# Patient Record
Sex: Male | Born: 1980 | Race: White | Hispanic: Yes | Marital: Married | Smoking: Never smoker
Health system: Southern US, Community
[De-identification: ages and names within clinical notes are randomized; demographics above are authoritative.]

## PROBLEM LIST (undated history)

## (undated) DIAGNOSIS — M109 Gout, unspecified: Secondary | ICD-10-CM

## (undated) DIAGNOSIS — B001 Herpesviral vesicular dermatitis: Secondary | ICD-10-CM

## (undated) DIAGNOSIS — F411 Generalized anxiety disorder: Secondary | ICD-10-CM

## (undated) DIAGNOSIS — N529 Male erectile dysfunction, unspecified: Secondary | ICD-10-CM

## (undated) DIAGNOSIS — I10 Essential (primary) hypertension: Secondary | ICD-10-CM

## (undated) DIAGNOSIS — F900 Attention-deficit hyperactivity disorder, predominantly inattentive type: Secondary | ICD-10-CM

## (undated) DIAGNOSIS — L219 Seborrheic dermatitis, unspecified: Secondary | ICD-10-CM

## (undated) DIAGNOSIS — R945 Abnormal results of liver function studies: Secondary | ICD-10-CM

## (undated) DIAGNOSIS — C8330 Diffuse large B-cell lymphoma, unspecified site: Secondary | ICD-10-CM

## (undated) HISTORY — DX: Attention-deficit hyperactivity disorder, predominantly inattentive type: F90.0

## (undated) HISTORY — DX: Gout, unspecified: M10.9

## (undated) HISTORY — DX: Male erectile dysfunction, unspecified: N52.9

## (undated) HISTORY — DX: Herpesviral vesicular dermatitis: B00.1

## (undated) HISTORY — DX: Seborrheic dermatitis, unspecified: L21.9

## (undated) HISTORY — DX: Diffuse large B-cell lymphoma, unspecified site: C83.30

## (undated) HISTORY — DX: Abnormal results of liver function studies: R94.5

## (undated) HISTORY — DX: Generalized anxiety disorder: F41.1

---

## 2005-05-26 ENCOUNTER — Emergency Department (HOSPITAL_COMMUNITY): Admission: EM | Admit: 2005-05-26 | Discharge: 2005-05-26 | Payer: Self-pay | Admitting: Emergency Medicine

## 2005-08-21 ENCOUNTER — Emergency Department (HOSPITAL_COMMUNITY): Admission: EM | Admit: 2005-08-21 | Discharge: 2005-08-21 | Payer: Self-pay | Admitting: *Deleted

## 2006-06-27 ENCOUNTER — Emergency Department (HOSPITAL_COMMUNITY): Admission: EM | Admit: 2006-06-27 | Discharge: 2006-06-27 | Payer: Self-pay | Admitting: Emergency Medicine

## 2006-10-20 ENCOUNTER — Emergency Department (HOSPITAL_COMMUNITY): Admission: EM | Admit: 2006-10-20 | Discharge: 2006-10-20 | Payer: Self-pay | Admitting: Family Medicine

## 2010-04-01 ENCOUNTER — Emergency Department (HOSPITAL_COMMUNITY): Admission: EM | Admit: 2010-04-01 | Discharge: 2010-04-01 | Payer: Self-pay | Admitting: Family Medicine

## 2011-08-22 ENCOUNTER — Other Ambulatory Visit: Payer: Self-pay | Admitting: Family Medicine

## 2011-08-24 ENCOUNTER — Ambulatory Visit
Admission: RE | Admit: 2011-08-24 | Discharge: 2011-08-24 | Disposition: A | Discharge status: Home / Self Care | Payer: BC Managed Care – PPO | Source: Ambulatory Visit | Attending: Family Medicine | Admitting: Family Medicine

## 2011-10-28 ENCOUNTER — Encounter (HOSPITAL_COMMUNITY): Payer: Self-pay

## 2011-10-28 ENCOUNTER — Emergency Department (INDEPENDENT_AMBULATORY_CARE_PROVIDER_SITE_OTHER)
Admission: EM | Admit: 2011-10-28 | Discharge: 2011-10-28 | Disposition: A | Discharge status: Home / Self Care | Payer: BC Managed Care – PPO | Source: Home / Self Care | Attending: Emergency Medicine | Admitting: Emergency Medicine

## 2011-10-28 DIAGNOSIS — S39011A Strain of muscle, fascia and tendon of abdomen, initial encounter: Secondary | ICD-10-CM

## 2011-10-28 DIAGNOSIS — IMO0002 Reserved for concepts with insufficient information to code with codable children: Secondary | ICD-10-CM

## 2011-10-28 HISTORY — DX: Essential (primary) hypertension: I10

## 2011-10-28 MED ORDER — DICLOFENAC SODIUM 75 MG PO TBEC: 75.0000 mg | DELAYED_RELEASE_TABLET | Freq: Two times a day (BID) | ORAL | Status: DC

## 2011-10-28 MED ORDER — ACETAMINOPHEN-CODEINE #3 300-30 MG PO TABS: 1.0000 | ORAL_TABLET | ORAL | Status: AC | PRN

## 2011-10-28 NOTE — ED Notes (Signed)
States was lifting dryer yesterday and felt pulling sensation, started having pain hs.

## 2011-10-28 NOTE — ED Provider Notes (Signed)
Chief Complaint  Patient presents with  . Abdominal Pain    lower left quad abdominal pain    History of Present Illness:  Mark Burns was moving a heavy dryer yesterday at his home when he felt a strain in his left lower abdomen and ever since then he's had a dull ache in the left lower quadrant of the abdomen which is worse if he stands up or sits up and better if he lies down. He cannot feel any bulge or lump in the area. He's felt slightly nauseated and had some bladder pressure but denies any fever, chills, vomiting, constipation, diarrhea, blood in the stool, blood in the urine, dysuria, or testicular swelling.  Review of Systems:  Other than noted above, the patient denies any of the following symptoms: Constitutional:  No fever, chills, fatigue, weight loss or anorexia. Lungs:  No cough or shortness of breath. Heart:  No chest pain, palpitations, syncope or edema. Abdomen:  No nausea, vomiting, hematememesis, melena, diarrhea, or hematochezia. GU:  No dysuria, frequency, urgency, or hematuria. Skin:  No rash or itching.  PMFSH:  Past medical history, family history, social history, meds, and allergies were reviewed.  Physical Exam:   Vital signs:  BP 153/95  Pulse 86  Temp(Src) 98.4 F (36.9 C) (Oral)  Resp 16  SpO2 99% Gen:  Alert, oriented, in no distress. Lungs:  Breath sounds clear and equal bilaterally.  No wheezes, rales or rhonchi. Heart:  Regular rhythm.  No gallops or murmers.   Abdomen:  His abdomen was soft and flat and nondistended. There was minimal pain to palpation in the left lower cord and without guarding or rebound. I could not appreciate any bulge or lump in this area. There was no organomegaly or masses and bowel sounds are normally active. He had no inguinal hernia, testes were normal. Genital exam was unremarkable. Skin:  Clear, warm and dry.  No rash.  Assessment:   Diagnoses that have been ruled out:  None  Diagnoses that are still under consideration:    None  Final diagnoses:  Abdominal muscle strain    Plan:   1.  The following meds were prescribed:   New Prescriptions   ACETAMINOPHEN-CODEINE (TYLENOL #3) 300-30 MG PER TABLET    Take 1-2 tablets by mouth every 4 (four) hours as needed for pain.   DICLOFENAC (VOLTAREN) 75 MG EC TABLET    Take 1 tablet (75 mg total) by mouth 2 (two) times daily.   2.  The patient was instructed in symptomatic care and handouts were given. 3.  The patient was told to return if becoming worse in any way, if no better in 3 or 4 days, and given some red flag symptoms that would indicate earlier return. 4.   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 He was told to avoid heavy lifting for the next 2 weeks. Over the next couple days he is to rest, elevate his legs, apply ice. He was told to return if she should become worse in any way or if you can feel a bulge or lump in the area.   Roque Lias, MD 10/28/11 [phone]

## 2012-01-10 ENCOUNTER — Encounter (HOSPITAL_COMMUNITY): Payer: Self-pay | Admitting: Physical Medicine and Rehabilitation

## 2012-01-10 ENCOUNTER — Emergency Department (HOSPITAL_COMMUNITY)
Admission: EM | Admit: 2012-01-10 | Discharge: 2012-01-10 | Disposition: A | Discharge status: Home / Self Care | Payer: BC Managed Care – PPO | Attending: Emergency Medicine | Admitting: Emergency Medicine

## 2012-01-10 DIAGNOSIS — R109 Unspecified abdominal pain: Secondary | ICD-10-CM | POA: Insufficient documentation

## 2012-01-10 DIAGNOSIS — K5289 Other specified noninfective gastroenteritis and colitis: Secondary | ICD-10-CM | POA: Insufficient documentation

## 2012-01-10 DIAGNOSIS — K529 Noninfective gastroenteritis and colitis, unspecified: Secondary | ICD-10-CM

## 2012-01-10 LAB — URINALYSIS, ROUTINE W REFLEX MICROSCOPIC
Bilirubin Urine: NEGATIVE
Glucose, UA: NEGATIVE mg/dL
Ketones, ur: NEGATIVE mg/dL
Leukocytes, UA: NEGATIVE
Nitrite: NEGATIVE
Specific Gravity, Urine: 1.024 (ref 1.005–1.030)
Urobilinogen, UA: 0.2 mg/dL (ref 0.0–1.0)
pH: 5.5 (ref 5.0–8.0)

## 2012-01-10 LAB — URINE MICROSCOPIC-ADD ON

## 2012-01-10 MED ORDER — ONDANSETRON HCL 8 MG PO TABS: 8.0000 mg | ORAL_TABLET | Freq: Three times a day (TID) | ORAL | Status: AC | PRN

## 2012-01-10 MED ORDER — FAMOTIDINE IN NACL 20-0.9 MG/50ML-% IV SOLN
20.0000 mg | Freq: Once | INTRAVENOUS | Status: AC
  Administered 2012-01-10: 20 mg via INTRAVENOUS
  Filled 2012-01-10: qty 50

## 2012-01-10 MED ORDER — HYDROMORPHONE HCL PF 1 MG/ML IJ SOLN
1.0000 mg | Freq: Once | INTRAMUSCULAR | Status: AC
  Administered 2012-01-10: 1 mg via INTRAVENOUS
  Filled 2012-01-10: qty 1

## 2012-01-10 MED ORDER — SODIUM CHLORIDE 0.9 % IV SOLN
INTRAVENOUS | Status: DC
  Administered 2012-01-10: 125 mL/h via INTRAVENOUS
  Administered 2012-01-10: 19:00:00 via INTRAVENOUS

## 2012-01-10 MED ORDER — HYDROMORPHONE HCL PF 2 MG/ML IJ SOLN
2.0000 mg | Freq: Once | INTRAMUSCULAR | Status: AC
  Administered 2012-01-10: 2 mg via INTRAVENOUS
  Filled 2012-01-10: qty 1

## 2012-01-10 MED ORDER — SODIUM CHLORIDE 0.9 % IV BOLUS (SEPSIS)
2000.0000 mL | Freq: Once | INTRAVENOUS | Status: AC
  Administered 2012-01-10: 1000 mL via INTRAVENOUS

## 2012-01-10 MED ORDER — HYDROCODONE-ACETAMINOPHEN 5-325 MG PO TABS: 2.0000 | ORAL_TABLET | ORAL | Status: AC | PRN

## 2012-01-10 MED ORDER — FAMOTIDINE 20 MG PO TABS: 20.0000 mg | ORAL_TABLET | Freq: Two times a day (BID) | ORAL | Status: DC | PRN

## 2012-01-10 MED ORDER — ONDANSETRON HCL 4 MG/2ML IJ SOLN
4.0000 mg | Freq: Once | INTRAMUSCULAR | Status: AC
  Administered 2012-01-10: 4 mg via INTRAVENOUS
  Filled 2012-01-10: qty 2

## 2012-01-10 NOTE — Discharge Instructions (Signed)
Abdominal Pain (Nonspecific) Your exam might not show the exact reason you have abdominal pain. Since there are many different causes of abdominal pain, another checkup and more tests may be needed. It is very important to follow up for lasting (persistent) or worsening symptoms. A possible cause of abdominal pain in any person who still has his or her appendix is acute appendicitis. Appendicitis is often hard to diagnose. Normal blood tests, urine tests, ultrasound, and CT scans do not completely rule out early appendicitis or other causes of abdominal pain. Sometimes, only the changes that happen over time will allow appendicitis and other causes of abdominal pain to be determined. Other potential problems that may require surgery may also take time to become more apparent. Because of this, it is important that you follow all of the instructions below. HOME CARE INSTRUCTIONS   Rest as much as possible.   Do not eat solid food until your pain is gone.   While adults or children have pain: A diet of water, weak decaffeinated tea, broth or bouillon, gelatin, oral rehydration solutions (ORS), frozen ice pops, or ice chips may be helpful.   When pain is gone in adults or children: Start a light diet (dry toast, crackers, applesauce, or white rice). Increase the diet slowly as long as it does not bother you. Eat no dairy products (including cheese and eggs) and no spicy, fatty, fried, or high-fiber foods.   Use no alcohol, caffeine, or cigarettes.   Take your regular medicines unless your caregiver told you not to.   Take any prescribed medicine as directed.   Only take over-the-counter or prescription medicines for pain, discomfort, or fever as directed by your caregiver. Do not give aspirin to children.  If your caregiver has given you a follow-up appointment, it is very important to keep that appointment. Not keeping the appointment could result in a permanent injury and/or lasting (chronic) pain  and/or disability. If there is any problem keeping the appointment, you must call to reschedule.  SEEK IMMEDIATE MEDICAL CARE IF:   Your pain is not gone in 24 hours.   Your pain becomes worse, changes location, or feels different.   You or your child has an oral temperature above 102 F (38.9 C), not controlled by medicine.   Your baby is older than 3 months with a rectal temperature of 102 F (38.9 C) or higher.   Your baby is 55 months old or younger with a rectal temperature of 100.4 F (38 C) or higher.   You have shaking chills.   You keep throwing up (vomiting) or cannot drink liquids.   There is blood in your vomit or you see blood in your bowel movements.   Your bowel movements become dark or black.   You have frequent bowel movements.   Your bowel movements stop (become blocked) or you cannot pass gas.   You have bloody, frequent, or painful urination.   You have yellow discoloration in the skin or whites of the eyes.   Your stomach becomes bloated or bigger.   You have dizziness or fainting.   You have chest or back pain.  MAKE SURE YOU:   Understand these instructions.   Will watch your condition.   Will get help right away if you are not doing well or get worse.  Document Released: 10/01/2005 Document Revised: 09/20/2011 Document Reviewed: 08/29/2009 [facility] Patient Information 2012 [place], [place].Clear Liquid Diet The clear liquid dietconsists of foods that are liquid or will  become liquid at room temperature.You should be able to see through the liquid and beverages. Examples of foods allowed on a clear liquid diet include fruit juice, broth or bouillon, gelatin, or frozen ice pops. The purpose of this diet is to provide necessary fluid, electrolytes such as sodium and potassium, and energy to keep the body functioning during times when you are not able to consume a regular diet.A clear liquid diet should not be continued for long periods of time as  it is not nutritionally adequate.  REASONS FOR USING A CLEAR LIQUID DIET  In sudden onset (acute) conditions for a patient before or after surgery.   As the first step in oral feeding.   For fluid and electrolyte replacement in diarrheal diseases.   As a diet before certain medical tests are performed.  ADEQUACY The clear liquid diet is adequate only in ascorbic acid, according to the Recommended Dietary Allowances of the [employer]. CHOOSING FOODS Breads and Starches  Allowed:  None are allowed.   Avoid: All are avoided.  Vegetables  Allowed:  Strained tomato or vegetable juice.   Avoid: Any others.  Fruit  Allowed:  Strained fruit juices and fruit drinks. Include 1 serving of citrus or vitamin C-enriched fruit juice daily.   Avoid: Any others.  Meat and Meat Substitutes  Allowed:  None are allowed.   Avoid: All are avoided.  Milk  Allowed:  None are allowed.   Avoid: All are avoided.  Soups and Combination Foods  Allowed:  Clear bouillon, broth, or strained broth-based soups.   Avoid: Any others.  Desserts and Sweets  Allowed:  Sugar, honey. High protein gelatin. Flavored gelatin, ices, or frozen ice pops that do not contain milk.   Avoid: Any others.  Fats and Oils  Allowed:  None are allowed.   Avoid: All are avoided.  Beverages  Allowed: Cereal beverages, coffee (regular or decaffeinated), tea, or soda at the discretion of your caregiver.   Avoid: Any others.  Condiments  Allowed:  Iodized salt.   Avoid: Any others, including pepper.  Supplements  Allowed:  Liquid nutrition beverages.   Avoid: Any others that contain lactose or fiber.  SAMPLE MEAL PLAN Breakfast  4 oz (120 mL) strained orange juice.    to 1 cup (125 to 250 mL) gelatin (plain or fortified).   1 cup (250 mL) beverage (coffee or tea).   Sugar, if desired.  Midmorning Snack   cup (125 mL) gelatin (plain or fortified).  Lunch  1 cup (250 mL) broth or  consomm.   4 oz (120 mL) strained grapefruit juice.    cup (125 mL) gelatin (plain or fortified).   1 cup (250 mL) beverage (coffee or tea).   Sugar, if desired.  Midafternoon Snack   cup (125 mL) fruit ice.    cup (125 mL) strained fruit juice.  Dinner  1 cup (250 mL) broth or consomm.    cup (125 mL) cranberry juice.    cup (125 mL) flavored gelatin (plain or fortified).   1 cup (250 mL) beverage (coffee or tea).   Sugar, if desired.  Evening Snack  4 oz (120 mL) strained apple juice (vitamin C-fortified).    cup (125 mL) flavored gelatin (plain or fortified).  Document Released: 10/01/2005 Document Revised: 09/20/2011 Document Reviewed: 12/29/2010 [facility] Patient Information 2012 [place], [place].Viral Gastroenteritis Viral gastroenteritis is also known as stomach flu. This condition affects the stomach and intestinal tract. It can cause sudden diarrhea and vomiting.  The illness typically lasts 3 to 8 days. Most people develop an immune response that eventually gets rid of the virus. While this natural response develops, the virus can make you quite ill. CAUSES  Many different viruses can cause gastroenteritis, such as rotavirus or noroviruses. You can catch one of these viruses by consuming contaminated food or water. You may also catch a virus by sharing utensils or other personal items with an infected person or by touching a contaminated surface. SYMPTOMS  The most common symptoms are diarrhea and vomiting. These problems can cause a severe loss of body fluids (dehydration) and a body salt (electrolyte) imbalance. Other symptoms may include:  Fever.   Headache.   Fatigue.   Abdominal pain.  DIAGNOSIS  Your caregiver can usually diagnose viral gastroenteritis based on your symptoms and a physical exam. A stool sample may also be taken to test for the presence of viruses or other infections. TREATMENT  This illness typically goes away on its own.  Treatments are aimed at rehydration. The most serious cases of viral gastroenteritis involve vomiting so severely that you are not able to keep fluids down. In these cases, fluids must be given through an intravenous line (IV). HOME CARE INSTRUCTIONS   Drink enough fluids to keep your urine clear or pale yellow. Drink small amounts of fluids frequently and increase the amounts as tolerated.   Ask your caregiver for specific rehydration instructions.   Avoid:   Foods high in sugar.   Alcohol.   Carbonated drinks.   Tobacco.   Juice.   Caffeine drinks.   Extremely hot or cold fluids.   Fatty, greasy foods.   Too much intake of anything at one time.   Dairy products until 24 to 48 hours after diarrhea stops.   You may consume probiotics. Probiotics are active cultures of beneficial bacteria. They may lessen the amount and number of diarrheal stools in adults. Probiotics can be found in yogurt with active cultures and in supplements.   Wash your hands well to avoid spreading the virus.   Only take over-the-counter or prescription medicines for pain, discomfort, or fever as directed by your caregiver. Do not give aspirin to children. Antidiarrheal medicines are not recommended.   Ask your caregiver if you should continue to take your regular prescribed and over-the-counter medicines.   Keep all follow-up appointments as directed by your caregiver.  SEEK IMMEDIATE MEDICAL CARE IF:   You are unable to keep fluids down.   You do not urinate at least once every 6 to 8 hours.   You develop shortness of breath.   You notice blood in your stool or vomit. This may look like coffee grounds.   You have abdominal pain that increases or is concentrated in one small area (localized).   You have persistent vomiting or diarrhea.   You have a fever.   The patient is a child younger than 3 months, and he or she has a fever.   The patient is a child older than 3 months, and he or she  has a fever and persistent symptoms.   The patient is a child older than 3 months, and he or she has a fever and symptoms suddenly get worse.   The patient is a baby, and he or she has no tears when crying.  MAKE SURE YOU:   Understand these instructions.   Will watch your condition.   Will get help right away if you are not doing well or  get worse.  Document Released: 10/01/2005 Document Revised: 09/20/2011 Document Reviewed: 07/18/2011 [facility] Patient Information 2012 [place], [place].

## 2012-01-10 NOTE — ED Provider Notes (Signed)
History     CSN: [id number]  Arrival date & time 01/10/12  1440   First MD Initiated Contact with Patient 01/10/12 1538      Chief Complaint  Patient presents with  . Abdominal Pain  . Nausea  . Emesis  . Diarrhea    (Consider location/radiation/quality/duration/timing/severity/associated sxs/prior treatment) Patient is a 31 y.o. male presenting with abdominal pain. The history is provided by the patient.  Abdominal Pain The primary symptoms of the illness include abdominal pain, fatigue, nausea, vomiting and diarrhea. The primary symptoms of the illness do not include fever, shortness of breath, hematemesis, hematochezia or dysuria. The current episode started yesterday. The onset of the illness was gradual. The problem has not changed since onset. The abdominal pain is generalized. The abdominal pain does not radiate. The severity of the abdominal pain is 8/10. The abdominal pain is relieved by vomiting, bowel movement and being still. The abdominal pain is exacerbated by movement and eating (Palpation).  The fatigue began yesterday. The fatigue has been unchanged since its onset.  The vomiting began yesterday. Vomiting occurs 6 to 10 times per day. The emesis contains stomach contents.  The diarrhea began yesterday. The diarrhea is watery (Without blood or mucus). The diarrhea occurs 5 to 10 times per day.  The patient has had a change in bowel habit. Additional symptoms associated with the illness include chills, anorexia, diaphoresis and heartburn. Symptoms associated with the illness do not include constipation, urgency, hematuria, frequency or back pain.    Past Medical History  Diagnosis Date  . Hypertension     No past surgical history on file.  History reviewed. No pertinent family history.  History  Substance Use Topics  . Smoking status: Never Smoker   . Smokeless tobacco: Not on file  . Alcohol Use: Yes      Review of Systems  Unable to perform  ROS Constitutional: Positive for chills, diaphoresis, appetite change and fatigue. Negative for fever, activity change and unexpected weight change.  HENT: Negative for ear pain, congestion, sore throat, rhinorrhea, mouth sores, trouble swallowing, neck pain, neck stiffness and postnasal drip.   Eyes: Negative.   Respiratory: Negative for cough, chest tightness, shortness of breath and wheezing.   Cardiovascular: Negative for chest pain, palpitations and leg swelling.  Gastrointestinal: Positive for heartburn, nausea, vomiting, abdominal pain, diarrhea and anorexia. Negative for constipation, blood in stool, hematochezia, abdominal distention, anal bleeding, rectal pain and hematemesis.  Genitourinary: Negative for dysuria, urgency, frequency, hematuria and flank pain.  Musculoskeletal: Negative for myalgias, back pain and arthralgias.  Skin: Negative for color change, pallor, rash and wound.  Neurological: Negative for dizziness, syncope, weakness, light-headedness and headaches.  Hematological: Negative for adenopathy.  Psychiatric/Behavioral: Negative.     Allergies  Sulfa antibiotics  Home Medications   Current Outpatient Rx  Name Route Sig Dispense Refill  . CITALOPRAM HYDROBROMIDE 40 MG PO TABS Oral Take 40 mg by mouth daily.    Marland Kitchen LOSARTAN POTASSIUM 100 MG PO TABS Oral Take 100 mg by mouth daily.    Marland Kitchen FAMOTIDINE 20 MG PO TABS Oral Take 1 tablet (20 mg total) by mouth 2 (two) times daily as needed for heartburn (upset stomach). 14 tablet 0  . HYDROCODONE-ACETAMINOPHEN 5-325 MG PO TABS Oral Take 2 tablets by mouth every 4 (four) hours as needed for pain. 20 tablet 0  . ONDANSETRON HCL 8 MG PO TABS Oral Take 1 tablet (8 mg total) by mouth every 8 (eight) hours as needed for  nausea. 12 tablet 0    BP 120/61  Pulse 108  Temp(Src) 102.4 F (39.1 C) (Oral)  Resp 18  SpO2 98%  Physical Exam  Nursing note and vitals reviewed. Constitutional: He is oriented to person, place, and  time. He appears well-developed and well-nourished. He is active.  Non-toxic appearance. He does not have a sickly appearance. He does not appear ill. He appears distressed.  HENT:  Head: Normocephalic and atraumatic.  Right Ear: Hearing, tympanic membrane, external ear and ear canal normal.  Left Ear: Hearing, tympanic membrane, external ear and ear canal normal.  Nose: Nose normal. No mucosal edema.  Mouth/Throat: Uvula is midline and oropharynx is clear and moist. Mucous membranes are dry. No oral lesions. No uvula swelling. No oropharyngeal exudate, posterior oropharyngeal edema, posterior oropharyngeal erythema or tonsillar abscesses.  Eyes: Conjunctivae and EOM are normal. Pupils are equal, round, and reactive to light. Right eye exhibits no chemosis, no discharge and no exudate. Left eye exhibits no chemosis, no discharge and no exudate. Right conjunctiva is not injected. Left conjunctiva is not injected. No scleral icterus.  Neck: Normal range of motion, full passive range of motion without pain and phonation normal. Neck supple. No rigidity. No Brudzinski's sign noted.  Cardiovascular: Normal rate, regular rhythm, intact distal pulses and normal pulses.   No extrasystoles are present.  Pulmonary/Chest: Effort normal and breath sounds normal. No accessory muscle usage. Not tachypneic. No respiratory distress. He has no decreased breath sounds. He has no wheezes. He has no rhonchi. He has no rales. He exhibits no tenderness, no crepitus and no retraction.  Abdominal: Soft. Normal appearance. He exhibits no shifting dullness, no distension, no pulsatile liver, no fluid wave, no abdominal bruit, no ascites, no pulsatile midline mass and no mass. Bowel sounds are increased. There is no hepatosplenomegaly. There is generalized tenderness. There is no rigidity, no rebound, no guarding and no CVA tenderness. No hernia.  Musculoskeletal: Normal range of motion.  Neurological: He is alert and oriented to  person, place, and time. He has normal strength and normal reflexes. He is not disoriented. No cranial nerve deficit. Coordination normal. GCS eye subscore is 4. GCS verbal subscore is 5. GCS motor subscore is 6.  Skin: Skin is warm, dry and intact. No bruising, no ecchymosis, no lesion and no rash noted. He is not diaphoretic. No erythema. No pallor.  Psychiatric: He has a normal mood and affect. His speech is normal and behavior is normal. Judgment and thought content normal. Cognition and memory are normal.    ED Course  Procedures (including critical care time)  Labs Reviewed  URINALYSIS, ROUTINE W REFLEX MICROSCOPIC - Abnormal; Notable for the following:    Hgb urine dipstick MODERATE (*)    Protein, ur 30 (*)    All other components within normal limits  URINE MICROSCOPIC-ADD ON - Abnormal; Notable for the following:    Casts HYALINE CASTS (*)    All other components within normal limits   No results found.   1. Gastroenteritis   2. Abdominal pain       MDM  The patient's symptoms and evaluation are compatible with acute viral gastroenteritis with generalized abdominal cramping likely secondary to inflammation of the stomach and intestines do to viral infection. He appears moderately dehydrated. I will replace the patient's fluid losses with IV fluids and treat his symptoms before reevaluating him and if improved, sending the patient home with prescription medications to treat his symptoms.  On reevaluation, the  patient's nausea has resolved with no further vomiting and his abdominal pain is significantly improved. His been rehydrated with at least 2 L of normal saline and is now tolerating oral intake.         Felisa Bonier, MD 01/10/12 2128

## 2012-01-10 NOTE — ED Notes (Signed)
Pt to ED c/o nausea, vomiting and diarrhea.  St's his children were sick and now he has it.  St's has vomited severals times today and now feels dizzy.

## 2012-01-10 NOTE — ED Notes (Signed)
Pt presents to department for evaluation of abdominal pain, N/V/D, and fatigue. Onset last night while at home. Pt states unable to keep down food/fluids today. Also states he feels dizzy. He is alert and oriented x4. 7/10 abdominal pain at the time.

## 2012-08-09 ENCOUNTER — Other Ambulatory Visit: Payer: Self-pay

## 2012-08-09 ENCOUNTER — Encounter (HOSPITAL_COMMUNITY): Payer: Self-pay | Admitting: Emergency Medicine

## 2012-08-09 ENCOUNTER — Emergency Department (INDEPENDENT_AMBULATORY_CARE_PROVIDER_SITE_OTHER)
Admission: EM | Admit: 2012-08-09 | Discharge: 2012-08-09 | Disposition: A | Discharge status: Home / Self Care | Payer: BC Managed Care – PPO | Source: Home / Self Care | Attending: Emergency Medicine | Admitting: Emergency Medicine

## 2012-08-09 DIAGNOSIS — R0789 Other chest pain: Secondary | ICD-10-CM

## 2012-08-09 MED ORDER — CYCLOBENZAPRINE HCL 10 MG PO TABS: 10.0000 mg | ORAL_TABLET | Freq: Three times a day (TID) | ORAL | Status: DC | PRN

## 2012-08-09 MED ORDER — HYDROCODONE-ACETAMINOPHEN 5-500 MG PO TABS: 1.0000 | ORAL_TABLET | Freq: Four times a day (QID) | ORAL | Status: DC | PRN

## 2012-08-09 NOTE — ED Notes (Addendum)
Pt c/o chest pain since 11:00 this am... Pain gradually getting worse... Sx include: left arm numbness that radiates towards the left hand, painful to inhale... 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 Denies: Headaches, blurry vision, SOB, edema... Hx of Hypertension... Pt is alert w/no signs of distress

## 2012-08-09 NOTE — ED Provider Notes (Signed)
History     CSN: [id number]  Arrival date & time 08/09/12  1811   First MD Initiated Contact with Patient 08/09/12 1813      Chief Complaint  Patient presents with  . Chest Pain    (Consider location/radiation/quality/duration/timing/severity/associated sxs/prior treatment) Patient is a 31 y.o. male presenting with chest pain. The history is provided by the patient.  Chest Pain The chest pain began 2 days ago. Chest pain occurs constantly. The chest pain is unchanged. The pain is associated with breathing and lifting. At its most intense, the pain is at 7/10. The quality of the pain is described as aching. The pain radiates to the left shoulder and upper back. Chest pain is worsened by certain positions and deep breathing. Pertinent negatives for primary symptoms include no fever, no fatigue, no shortness of breath, no cough, no palpitations, no abdominal pain, no nausea, no vomiting, no dizziness and no altered mental status.  Pertinent negatives for associated symptoms include no lower extremity edema and no numbness.     Past Medical History  Diagnosis Date  . Hypertension     History reviewed. No pertinent past surgical history.  No family history on file.  History  Substance Use Topics  . Smoking status: Never Smoker   . Smokeless tobacco: Not on file  . Alcohol Use: Yes      Review of Systems  Constitutional: Negative for fever, activity change and fatigue.  Respiratory: Negative for cough, chest tightness and shortness of breath.   Cardiovascular: Positive for chest pain. Negative for palpitations and leg swelling.  Gastrointestinal: Negative for nausea, vomiting and abdominal pain.  Neurological: Negative for dizziness and numbness.  Psychiatric/Behavioral: Negative for altered mental status.    Allergies  Sulfa antibiotics  Home Medications   Current Outpatient Rx  Name Route Sig Dispense Refill  . CITALOPRAM HYDROBROMIDE 40 MG PO TABS Oral Take 40 mg  by mouth daily.    Marland Kitchen LOSARTAN POTASSIUM 100 MG PO TABS Oral Take 100 mg by mouth daily.    . CYCLOBENZAPRINE HCL 10 MG PO TABS Oral Take 1 tablet (10 mg total) by mouth 3 (three) times daily as needed for muscle spasms. 15 tablet 0  . FAMOTIDINE 20 MG PO TABS Oral Take 1 tablet (20 mg total) by mouth 2 (two) times daily as needed for heartburn (upset stomach). 14 tablet 0  . HYDROCODONE-ACETAMINOPHEN 5-500 MG PO TABS Oral Take 1 tablet by mouth every 6 (six) hours as needed for pain. 15 tablet 0    BP 149/88  Pulse 80  Temp 98.2 F (36.8 C) (Oral)  Resp 18  SpO2 97%  Physical Exam  Nursing note and vitals reviewed. Constitutional: He appears well-developed and well-nourished.  Cardiovascular: Normal rate.  Exam reveals no gallop and no friction rub.   No murmur heard. Pulmonary/Chest: Effort normal and breath sounds normal. No respiratory distress. He has no wheezes. He has no rales. He exhibits tenderness. He exhibits no bony tenderness, no laceration, no crepitus, no edema, no deformity, no swelling and no retraction.    Musculoskeletal: He exhibits tenderness.  Neurological: He is alert.  Skin: No rash noted. No erythema.    ED Course  Procedures (including critical care time)  Labs Reviewed - No data to display No results found.   1. Muscular chest pain    Ekg, normal sinus rhythm ventricular rate of 70 beats per minute. Normal PR QRS interval and duration. No ST-T changes.   MDM  Exam  and recent history consistent with muscle skeletal left-sided anterior chest wall pain. Patient was prescribed a course of report along with Flexeril.        Jimmie Molly, MD 08/09/12 2013

## 2012-08-10 ENCOUNTER — Encounter (HOSPITAL_COMMUNITY): Payer: Self-pay

## 2012-10-15 HISTORY — PX: VASECTOMY: SHX75

## 2013-01-26 ENCOUNTER — Telehealth: Payer: Self-pay | Admitting: Physician Assistant

## 2013-01-26 MED ORDER — TADALAFIL 5 MG PO TABS: 5.0000 mg | ORAL_TABLET | Freq: Every day | ORAL | Status: DC | PRN

## 2013-01-26 NOTE — Telephone Encounter (Signed)
Medication refilled per protocol. 

## 2013-04-06 ENCOUNTER — Telehealth: Payer: Self-pay | Admitting: Family Medicine

## 2013-04-06 MED ORDER — LOSARTAN POTASSIUM-HCTZ 100-25 MG PO TABS: 1.0000 | ORAL_TABLET | Freq: Every day | ORAL | Status: DC

## 2013-04-06 NOTE — Telephone Encounter (Signed)
Pt given 30 day supply on BP meds,  please see when he needs appt and schedule.  Please look for pharmacy request and refill as appropriate

## 2013-04-07 ENCOUNTER — Encounter: Payer: Self-pay | Admitting: Family Medicine

## 2013-04-07 NOTE — Telephone Encounter (Signed)
Letter sent to pt to schedule f/u appt.

## 2013-05-13 ENCOUNTER — Other Ambulatory Visit: Payer: Self-pay | Admitting: Physician Assistant

## 2013-05-13 ENCOUNTER — Telehealth: Payer: Self-pay | Admitting: Physician Assistant

## 2013-05-13 ENCOUNTER — Other Ambulatory Visit: Payer: Self-pay | Admitting: *Deleted

## 2013-05-13 MED ORDER — LOSARTAN POTASSIUM-HCTZ 100-25 MG PO TABS: 1.0000 | ORAL_TABLET | Freq: Every day | ORAL | Status: DC

## 2013-05-13 NOTE — Telephone Encounter (Signed)
Patient needs to be seen before any further refills  Has appt scheduled for tomorrow

## 2013-05-13 NOTE — Telephone Encounter (Signed)
Medication refilled per protocol. 

## 2013-05-14 ENCOUNTER — Encounter: Payer: Self-pay | Admitting: Physician Assistant

## 2013-05-14 ENCOUNTER — Ambulatory Visit (INDEPENDENT_AMBULATORY_CARE_PROVIDER_SITE_OTHER): Payer: BC Managed Care – PPO | Admitting: Physician Assistant

## 2013-05-14 VITALS — BP 130/70 | HR 80 | Temp 98.4°F | Resp 20 | Ht 72.75 in | Wt 296.0 lb

## 2013-05-14 DIAGNOSIS — I10 Essential (primary) hypertension: Secondary | ICD-10-CM

## 2013-05-14 DIAGNOSIS — F419 Anxiety disorder, unspecified: Secondary | ICD-10-CM

## 2013-05-14 DIAGNOSIS — Z309 Encounter for contraceptive management, unspecified: Secondary | ICD-10-CM

## 2013-05-14 DIAGNOSIS — F411 Generalized anxiety disorder: Secondary | ICD-10-CM

## 2013-05-14 MED ORDER — LOSARTAN POTASSIUM-HCTZ 100-25 MG PO TABS: 1.0000 | ORAL_TABLET | Freq: Every day | ORAL | Status: DC

## 2013-05-14 MED ORDER — CITALOPRAM HYDROBROMIDE 40 MG PO TABS: 40.0000 mg | ORAL_TABLET | Freq: Every day | ORAL | Status: DC

## 2013-05-14 MED ORDER — LORAZEPAM 0.5 MG PO TABS: 0.5000 mg | ORAL_TABLET | Freq: Every evening | ORAL | Status: DC | PRN

## 2013-05-15 ENCOUNTER — Encounter: Payer: Self-pay | Admitting: Physician Assistant

## 2013-05-15 NOTE — Progress Notes (Signed)
Patient ID: Mark Burns MRN: [id number], DOB: 12/02/1980, 32 y.o. Date of Encounter: @DATE @  Chief Complaint:  Chief Complaint  Patient presents with  . 6 mth check up    c/o alot of stress    HPI: 32 y.o. year old male  Presents for f/u of anxiety d/o. His LOV was 10/2012.  He now works as an Tax inspector. Says the work isnot that difficult but requires a lot more hours and responsibilities. He is over multiple departments of teachers/staff. As well, if there is any sporting event or other event after hours, he has to be there to staff that. Summer: 7-5 Mon-Thurs. During school, there euntil 6-7pm or if an event there until 8-9 pm.  Has 2 kids: 5 and 58 y/o.Married.   At LOV Dr. Tanya Burns added Buspar. Pt says he toook it for one month but saw no difference so he did not continue it. IS taking th eCelexa 20mg  QD.  He has "bouts of panic"-twice a week on average-"Feels like walls are closing in around me like I am getting really small and everything around me is getting bigger. Itfeels like I am going crazy."   Says he "never calms down-even at home, always uptight, anxious." Does not sleep good. Tosses and turns all night. Ends up not falling asleep until 1-2a.m. Then time to wake up and is exhausted.Thenfeels tired and irritated.   Feels like if he could calm down/relax at night, this would really help him.   He stopped his Adderall months ago-was afraid it was making all of this worse. He says it did make it worse. "Was even more jacked up."  He IS taking BP med as directed. No adv effect.  Past Medical History  Diagnosis Date  . Anxiety   . Hypertension      Home Meds: See attached medication section for current medication list. Any medications entered into computer today will not appear on this note's list. The medications listed below were entered prior to today. Current Outpatient Prescriptions on File Prior to Visit  Medication Sig Dispense Refill  . cyclobenzaprine  (FLEXERIL) 10 MG tablet Take 1 tablet (10 mg total) by mouth 3 (three) times daily as needed for muscle spasms.  15 tablet  0  . famotidine (PEPCID) 20 MG tablet Take 1 tablet (20 mg total) by mouth 2 (two) times daily as needed for heartburn (upset stomach).  14 tablet  0  . HYDROcodone-acetaminophen (VICODIN) 5-500 MG per tablet Take 1 tablet by mouth every 6 (six) hours as needed for pain.  15 tablet  0  . tadalafil (CIALIS) 5 MG tablet Take 1 tablet (5 mg total) by mouth daily as needed for erectile dysfunction.  30 tablet  2   No current facility-administered medications on file prior to visit.    Allergies:  Allergies  Allergen Reactions  . Sulfa Antibiotics Hives    History   Social History  . Marital Status: Married    Spouse Name: N/A    Number of Children: N/A  . Years of Education: N/A   Occupational History  . Not on file.   Social History Main Topics  . Smoking status: Never Smoker   . Smokeless tobacco: Not on file  . Alcohol Use: Yes  . Drug Use: No  . Sexually Active: Not on file   Other Topics Concern  . Not on file   Social History Narrative  . No narrative on file    History reviewed. No  pertinent family history.   Review of Systems:  See HPI for pertinent ROS. All other ROS negative.    Physical Exam: Blood pressure 130/70, pulse 80, temperature 98.4 F (36.9 C), temperature source Oral, resp. rate 20, height 6' 0.75" (1.848 m), weight 296 lb (134.265 kg)., Body mass index is 39.32 kg/(m^2). General: Male. Appears in no acute distress. Lungs: Clear bilaterally to auscultation without wheezes, rales, or rhonchi. Breathing is unlabored. Heart: RRR with S1 S2. No murmurs, rubs, or gallops. Musculoskeletal:  Strength and tone normal for age. Extremities/Skin: Warm and dry. No edema.  Neuro: Alert and oriented X 3. Moves all extremities spontaneously. Gait is normal. CNII-XII grossly in tact. Psych:  Responds to questions appropriately with a normal  affect. He is very calm and appropriate throughout visit today.     ASSESSMENT AND PLAN:  32 y.o. year old male with  1. Anxiety Cont Celexa. Add Ativan QHS. Maybe if he can relax and can sleep at night, hopefully this will help. - LORazepam (ATIVAN) 0.5 MG tablet; Take 1 tablet (0.5 mg total) by mouth at bedtime as needed for anxiety.  Dispense: 30 tablet; Refill: 1 - citalopram (CELEXA) 40 MG tablet; Take 1 tablet (40 mg total) by mouth daily.  Dispense: 30 tablet; Refill: 5  2. Hypertension At goal. BMET nml 2013 on this med.  - losartan-hydrochlorothiazide (HYZAAR) 100-25 MG per tablet; Take 1 tablet by mouth daily.  Dispense: 30 tablet; Refill: 5  3. Contraception management He and his wife both agree they do not want more children. He wants to have vasectomy. - Ambulatory referral to Urology  F/U one month to see if this med changes brings improvement.   Mark Burns Mark Burns, [place], [facility] 05/15/2013 6:45 AM

## 2013-06-26 ENCOUNTER — Telehealth: Payer: Self-pay | Admitting: Physician Assistant

## 2013-06-26 MED ORDER — TADALAFIL 5 MG PO TABS: 5.0000 mg | ORAL_TABLET | Freq: Every day | ORAL | Status: DC | PRN

## 2013-06-26 NOTE — Telephone Encounter (Signed)
Medication refilled per protocol. 

## 2013-06-26 NOTE — Telephone Encounter (Signed)
Cialis 5 mg 1 QD prn

## 2013-07-02 ENCOUNTER — Ambulatory Visit: Payer: BC Managed Care – PPO | Admitting: Family Medicine

## 2013-07-02 DIAGNOSIS — Z0289 Encounter for other administrative examinations: Secondary | ICD-10-CM

## 2013-07-23 ENCOUNTER — Telehealth: Payer: Self-pay | Admitting: Physician Assistant

## 2013-07-23 DIAGNOSIS — N529 Male erectile dysfunction, unspecified: Secondary | ICD-10-CM

## 2013-07-23 MED ORDER — TADALAFIL 20 MG PO TABS: 20.0000 mg | ORAL_TABLET | Freq: Every day | ORAL | Status: DC | PRN

## 2013-07-23 NOTE — Telephone Encounter (Signed)
Can change from the 5 mg daily use to the 20 mg PRN use. Send prescription for Cialis 20 mg One at least 30-45 minutes prior to sexual activity. #6 with 11 refills.

## 2013-07-23 NOTE — Telephone Encounter (Signed)
New rx to pharmacy.

## 2013-07-23 NOTE — Telephone Encounter (Signed)
Patient wants to see if his Ciallis can be changed to 20 mg instead of 5 mg?

## 2013-08-06 ENCOUNTER — Ambulatory Visit (INDEPENDENT_AMBULATORY_CARE_PROVIDER_SITE_OTHER): Payer: BC Managed Care – PPO | Admitting: Physician Assistant

## 2013-08-06 ENCOUNTER — Encounter: Payer: Self-pay | Admitting: Physician Assistant

## 2013-08-06 VITALS — BP 122/94 | HR 96 | Temp 98.4°F | Resp 20 | Ht 72.5 in | Wt 284.0 lb

## 2013-08-06 DIAGNOSIS — R7989 Other specified abnormal findings of blood chemistry: Secondary | ICD-10-CM

## 2013-08-06 DIAGNOSIS — F419 Anxiety disorder, unspecified: Secondary | ICD-10-CM

## 2013-08-06 DIAGNOSIS — N529 Male erectile dysfunction, unspecified: Secondary | ICD-10-CM

## 2013-08-06 DIAGNOSIS — F411 Generalized anxiety disorder: Secondary | ICD-10-CM

## 2013-08-06 DIAGNOSIS — I10 Essential (primary) hypertension: Secondary | ICD-10-CM

## 2013-08-06 MED ORDER — DIAZEPAM 10 MG PO TABS: 10.0000 mg | ORAL_TABLET | Freq: Every evening | ORAL | Status: DC | PRN

## 2013-08-06 MED ORDER — TADALAFIL 20 MG PO TABS: 20.0000 mg | ORAL_TABLET | Freq: Every day | ORAL | Status: DC | PRN

## 2013-08-06 NOTE — Progress Notes (Signed)
Patient ID: Mark Burns MRN: [id number], DOB: May 27, 1981, 32 y.o. Date of Encounter: 08/06/2013, 2:56 PM    Chief Complaint:  Chief Complaint  Patient presents with  . check up/labs    discuss anxiety meds     HPI: 32 y.o. year old male here to discuss medications.  See my note dated 05/15/13 for details and a much lengthier note.  At that visit he discussed that he never calms down even at home always uptight and anxious. Reported that he was not sleeping good. Tosses and turns all night. And 7 not falling asleep until 1 or 2 in the morning. And time to wake up and is exhausted. Then he feels tired and irritated. He felt that if he could down and relax at night that this would really help. At that visit I added Ativan to use at night in addition to his Celexa 40 mg.  He says that he recently used one of his wife's diazepam 10 mg at night and that this worked much better for him. Is requesting that we change his medicine to the diazepam 10 mg. Says he only takes it at night to calm down and does not take it during the day.  He is also needing a refill on Cialis. Says this is very Expensive and only wants one or 2 pills at the time.  As well he wanted to recheck his liver numbers. In the past he had been diagnosed with fatty liver. Says he has lost weight with diet changes and decreasing alcohol intake.Wants to  See if the liver numbers are any better.     Home Meds: See attached medication section for any medications that were entered at today's visit. The computer does not put those onto this list.The following list is a list of meds entered prior to today's visit.   Current Outpatient Prescriptions on File Prior to Visit  Medication Sig Dispense Refill  . citalopram (CELEXA) 40 MG tablet Take 1 tablet (40 mg total) by mouth daily.  30 tablet  5  . HYDROcodone-acetaminophen (VICODIN) 5-500 MG per tablet Take 1 tablet by mouth every 6 (six) hours as needed for pain.  15 tablet  0   . losartan-hydrochlorothiazide (HYZAAR) 100-25 MG per tablet Take 1 tablet by mouth daily.  30 tablet  5  . cyclobenzaprine (FLEXERIL) 10 MG tablet Take 1 tablet (10 mg total) by mouth 3 (three) times daily as needed for muscle spasms.  15 tablet  0  . famotidine (PEPCID) 20 MG tablet Take 1 tablet (20 mg total) by mouth 2 (two) times daily as needed for heartburn (upset stomach).  14 tablet  0   No current facility-administered medications on file prior to visit.    Allergies:  Allergies  Allergen Reactions  . Sulfa Antibiotics Hives      Review of Systems: See HPI for pertinent ROS. All other ROS negative.    Physical Exam: Blood pressure 122/94, pulse 96, temperature 98.4 F (36.9 C), temperature source Oral, resp. rate 20, height 6' 0.5" (1.842 m), weight 284 lb (128.822 kg)., Body mass index is 37.97 kg/(m^2). General:  Appears in no acute distress. Lungs: Clear bilaterally to auscultation without wheezes, rales, or rhonchi. Breathing is unlabored. Heart: Regular rhythm. No murmurs, rubs, or gallops. Abdomen: Soft, non-tender, non-distended with normoactive bowel sounds. No hepatomegaly. No rebound/guarding. No obvious abdominal masses. Msk:  Strength and tone normal for age. Extremities/Skin: Warm and dry. No clubbing or cyanosis. No edema. No rashes or  suspicious lesions. Neuro: Alert and oriented X 3. Moves all extremities spontaneously. Gait is normal. CNII-XII grossly in tact. Psych:  Responds to questions appropriately with a normal affect.     ASSESSMENT AND PLAN:  32 y.o. year old male with  1. Anxiety Continue Celexa 40 mg. Does not use more than one Valium at a time. Discussed this is maximum dose. Discussed not to use this every single night as his body will become dependent intolerant to this. - diazepam (VALIUM) 10 MG tablet; Take 1 tablet (10 mg total) by mouth at bedtime as needed for anxiety or sleep.  Dispense: 30 tablet; Refill: 1  2. Hypertension He is  on Hyzaar. While we are doing labs for liver functions we'll also check BMET. - COMPLETE METABOLIC PANEL WITH GFR  3. Erectile dysfunction - tadalafil (CIALIS) 20 MG tablet; Take 1 tablet (20 mg total) by mouth daily as needed for erectile dysfunction.  Dispense: 2 tablet; Refill: 11  4. Elevated LFTs - COMPLETE METABOLIC PANEL WITH GFR   Signed, [address] [place], [place], [facility] 08/06/2013 2:56 PM

## 2013-08-07 LAB — COMPLETE METABOLIC PANEL WITH GFR
ALT: 120 U/L — ABNORMAL HIGH (ref 0–53)
AST: 74 U/L — ABNORMAL HIGH (ref 0–37)
Albumin: 5 g/dL (ref 3.5–5.2)
Alkaline Phosphatase: 59 U/L (ref 39–117)
BUN: 22 mg/dL (ref 6–23)
CO2: 25 mEq/L (ref 19–32)
Calcium: 10.3 mg/dL (ref 8.4–10.5)
Chloride: 101 mEq/L (ref 96–112)
Creat: 0.94 mg/dL (ref 0.50–1.35)
GFR, Est African American: >89 mL/min
GFR, Est Non African American: >89 mL/min
Glucose, Bld: 86 mg/dL (ref 70–99)
Potassium: 3.9 mEq/L (ref 3.5–5.3)
Sodium: 137 mEq/L (ref 135–145)
Total Bilirubin: 0.5 mg/dL (ref 0.3–1.2)
Total Protein: 7.9 g/dL (ref 6.0–8.3)

## 2013-10-23 ENCOUNTER — Telehealth: Payer: Self-pay | Admitting: Physician Assistant

## 2013-10-23 LAB — CBC WITH DIFFERENTIAL/PLATELET
BASOS PCT: 1 %
Basophil #: 0.1 10*3/uL (ref 0.0–0.1)
EOS PCT: 1.4 %
Eosinophil #: 0.2 10*3/uL (ref 0.0–0.7)
HCT: 38.1 % — ABNORMAL LOW (ref 40.0–52.0)
HGB: 13.3 g/dL (ref 13.0–18.0)
Lymphocyte #: 2.7 10*3/uL (ref 1.0–3.6)
Lymphocyte %: 24.3 %
MCH: 30.4 pg (ref 26.0–34.0)
MCHC: 34.9 g/dL (ref 32.0–36.0)
MCV: 87 fL (ref 80–100)
Monocyte #: 1.1 x10 3/mm — ABNORMAL HIGH (ref 0.2–1.0)
Monocyte %: 10.1 %
NEUTROS ABS: 7.1 10*3/uL — AB (ref 1.4–6.5)
Neutrophil %: 63.2 %
PLATELETS: 311 10*3/uL (ref 150–440)
RBC: 4.37 10*6/uL — AB (ref 4.40–5.90)
RDW: 12.6 % (ref 11.5–14.5)
WBC: 11.3 10*3/uL — AB (ref 3.8–10.6)

## 2013-10-23 LAB — COMPREHENSIVE METABOLIC PANEL
ALK PHOS: 76 U/L
Albumin: 4.2 g/dL (ref 3.4–5.0)
Anion Gap: 5 — ABNORMAL LOW (ref 7–16)
BUN: 16 mg/dL (ref 7–18)
Bilirubin,Total: 0.5 mg/dL (ref 0.2–1.0)
CO2: 31 mmol/L (ref 21–32)
Calcium, Total: 9.5 mg/dL (ref 8.5–10.1)
Chloride: 100 mmol/L (ref 98–107)
Creatinine: 1.11 mg/dL (ref 0.60–1.30)
EGFR (African American): >60
EGFR (Non-African Amer.): >60
GLUCOSE: 99 mg/dL (ref 65–99)
Osmolality: 273 (ref 275–301)
Potassium: 3.9 mmol/L (ref 3.5–5.1)
SGOT(AST): 56 U/L — ABNORMAL HIGH (ref 15–37)
SGPT (ALT): 93 U/L — ABNORMAL HIGH (ref 12–78)
Sodium: 136 mmol/L (ref 136–145)
Total Protein: 8.3 g/dL — ABNORMAL HIGH (ref 6.4–8.2)

## 2013-10-23 NOTE — Telephone Encounter (Signed)
Pt is calling went to a walk in clinic yesterday Dr told him that he cellulitis and its not getting any better and he is wanting to speak to someone about this  Call back number is [phone]

## 2013-10-23 NOTE — Telephone Encounter (Signed)
Seen at [facility]. (no appt here)  Mark Burns he had bursitis and cellulitis right arm.  Started around elbow from wound sustained over the holidays.  Tx with Doxycycline, has taken three doses  Area of redness has increased over 1 inch passed marked area from yesterday.  Arm much more painful.  Told patient needs to be seen in ED.  May need IV antibiotics.  Pt acknowledged understanding.

## 2013-10-23 NOTE — Telephone Encounter (Signed)
Agree 

## 2013-10-24 ENCOUNTER — Inpatient Hospital Stay: Payer: Self-pay | Admitting: Internal Medicine

## 2013-10-25 LAB — CBC WITH DIFFERENTIAL/PLATELET
Basophil #: 0.1 10*3/uL (ref 0.0–0.1)
Basophil %: 1.1 %
Eosinophil #: 0.2 10*3/uL (ref 0.0–0.7)
Eosinophil %: 2.1 %
HCT: 33.9 % — ABNORMAL LOW (ref 40.0–52.0)
HGB: 12 g/dL — AB (ref 13.0–18.0)
Lymphocyte #: 2.4 10*3/uL (ref 1.0–3.6)
Lymphocyte %: 28.3 %
MCH: 31 pg (ref 26.0–34.0)
MCHC: 35.5 g/dL (ref 32.0–36.0)
MCV: 88 fL (ref 80–100)
MONO ABS: 0.9 x10 3/mm (ref 0.2–1.0)
Monocyte %: 10.3 %
NEUTROS PCT: 58.2 %
Neutrophil #: 4.9 10*3/uL (ref 1.4–6.5)
Platelet: 254 10*3/uL (ref 150–440)
RBC: 3.87 10*6/uL — AB (ref 4.40–5.90)
RDW: 12.5 % (ref 11.5–14.5)
WBC: 8.4 10*3/uL (ref 3.8–10.6)

## 2013-10-25 LAB — BASIC METABOLIC PANEL
Anion Gap: 3 — ABNORMAL LOW (ref 7–16)
BUN: 21 mg/dL — ABNORMAL HIGH (ref 7–18)
CALCIUM: 9 mg/dL (ref 8.5–10.1)
Chloride: 102 mmol/L (ref 98–107)
Co2: 31 mmol/L (ref 21–32)
Creatinine: 1.11 mg/dL (ref 0.60–1.30)
EGFR (African American): >60
EGFR (Non-African Amer.): >60
Glucose: 111 mg/dL — ABNORMAL HIGH (ref 65–99)
Osmolality: 276 (ref 275–301)
Potassium: 3.5 mmol/L (ref 3.5–5.1)
SODIUM: 136 mmol/L (ref 136–145)

## 2013-10-25 LAB — VANCOMYCIN, TROUGH: VANCOMYCIN, TROUGH: 10 ug/mL (ref 10–20)

## 2013-10-28 LAB — CULTURE, BLOOD (SINGLE)

## 2013-11-02 ENCOUNTER — Telehealth: Payer: Self-pay | Admitting: Family Medicine

## 2013-11-02 MED ORDER — COLCHICINE 0.6 MG PO TABS: ORAL_TABLET | ORAL | Status: DC

## 2013-11-02 NOTE — Telephone Encounter (Signed)
Pt called having gout flare in foot, tried otc nsaids Sent in colchicine therapy Increase water Avoid red meats , uric acid producing foods

## 2013-11-11 ENCOUNTER — Ambulatory Visit: Payer: Self-pay | Admitting: Physician Assistant

## 2013-11-16 ENCOUNTER — Telehealth: Payer: Self-pay | Admitting: Physician Assistant

## 2013-11-16 ENCOUNTER — Other Ambulatory Visit: Payer: Self-pay | Admitting: Physician Assistant

## 2013-11-16 NOTE — Telephone Encounter (Signed)
Approved for #30+ one additional refill 

## 2013-11-16 NOTE — Telephone Encounter (Signed)
PT is needing a refill on his diazepam (VALIUM) 10 MG tablet Call back number is [phone]

## 2013-11-16 NOTE — Telephone Encounter (Signed)
Last RF 10/13 #30 + 1.  Last OV 10/23  OK refill?

## 2013-11-17 NOTE — Telephone Encounter (Signed)
RX called in .

## 2013-11-18 ENCOUNTER — Encounter: Payer: Self-pay | Admitting: Physician Assistant

## 2013-11-18 ENCOUNTER — Ambulatory Visit (INDEPENDENT_AMBULATORY_CARE_PROVIDER_SITE_OTHER): Payer: BC Managed Care – PPO | Admitting: Physician Assistant

## 2013-11-18 VITALS — BP 142/94 | HR 84 | Temp 98.1°F | Resp 18 | Ht 73.5 in | Wt 285.0 lb

## 2013-11-18 DIAGNOSIS — I10 Essential (primary) hypertension: Secondary | ICD-10-CM

## 2013-11-18 DIAGNOSIS — N529 Male erectile dysfunction, unspecified: Secondary | ICD-10-CM

## 2013-11-18 DIAGNOSIS — F419 Anxiety disorder, unspecified: Secondary | ICD-10-CM

## 2013-11-18 DIAGNOSIS — R945 Abnormal results of liver function studies: Secondary | ICD-10-CM

## 2013-11-18 DIAGNOSIS — R7989 Other specified abnormal findings of blood chemistry: Secondary | ICD-10-CM

## 2013-11-18 DIAGNOSIS — F411 Generalized anxiety disorder: Secondary | ICD-10-CM

## 2013-11-18 MED ORDER — VARDENAFIL HCL 20 MG PO TABS: 20.0000 mg | ORAL_TABLET | Freq: Every day | ORAL | Status: DC | PRN

## 2013-11-19 ENCOUNTER — Encounter: Payer: Self-pay | Admitting: Physician Assistant

## 2013-11-19 DIAGNOSIS — R945 Abnormal results of liver function studies: Secondary | ICD-10-CM

## 2013-11-19 DIAGNOSIS — R7989 Other specified abnormal findings of blood chemistry: Secondary | ICD-10-CM | POA: Insufficient documentation

## 2013-11-19 HISTORY — DX: Other specified abnormal findings of blood chemistry: R79.89

## 2013-11-19 NOTE — Progress Notes (Signed)
Patient ID: Rowe Warman MRN: [id number], DOB: [birth year], 33 y.o. Date of Encounter: @DATE @  Chief Complaint:  Chief Complaint  Patient presents with  . 3 mth check up    reports hosp with septic rt elbow in early january at [place]  . wants to discuss restarting adderall    HPI: 33 y.o. year old male  presents for routine f/u OV.  Anxiety/Insomnia:  This has been treated with Celexa 40 mg. However, still, at [place] 8/14 he reported that he felt that he never called down even at home always felt uptight and anxious. Reported that he was not sleeping good. Was tossing turns all night. Often was not falling asleep until 1 or 2 in the morning. It would be time to wake up and would feel exhausted. Then he would feel exhausted and irritated. He felt that if he could just relax at night and gets some sleep that this would really help. At his visit in August 2014 I. have added Ativan in addition to the Celexa. However at his last visit on 10/14 he reported that he recently used one of his wife's diazepam 10 mg at night and that that worked much better for him. Requested that we change his medication to diazepam 10 mg. Did prescribe that at that visit. He has continued to use this and says that this is working well. He is now able to relax at night and is able to sleep and feeling rested.  In the past he has also been prescribed medication to use as needed for erectile dysfunction. Today he is asking if we have any samples were saved and scarred as the medicine is so expensive.  In the past he had been on medication for ADD. Been stopped because of his into anxiety and insomnia in the past. Today he is also asking wondering whether he should restart this.  Hypertension: He is taking his medication as directed and has no adverse effects.   Past Medical History  Diagnosis Date  . Anxiety   . Hypertension   . Erectile dysfunction      Home Meds: See attached medication section for current  medication list. Any medications entered into computer today will not appear on this note's list. The medications listed below were entered prior to today. Current Outpatient Prescriptions on File Prior to Visit  Medication Sig Dispense Refill  . citalopram (CELEXA) 40 MG tablet Take 1 tablet (40 mg total) by mouth daily.  30 tablet  5  . colchicine 0.6 MG tablet Take 1.2mg  x 1, repeat 1 tab in 1 hour, then 1 daily until complete  9 tablet  0  . diazepam (VALIUM) 10 MG tablet TAKE 1 TABLET BY MOUTH AT BEDTIME FOR ANXIETY OR SLEEP  30 tablet  1  . HYDROcodone-acetaminophen (VICODIN) 5-500 MG per tablet Take 1 tablet by mouth every 6 (six) hours as needed for pain.  15 tablet  0  . losartan-hydrochlorothiazide (HYZAAR) 100-25 MG per tablet Take 1 tablet by mouth daily.  30 tablet  5  . tadalafil (CIALIS) 20 MG tablet Take 1 tablet (20 mg total) by mouth daily as needed for erectile dysfunction.  2 tablet  11   No current facility-administered medications on file prior to visit.    Allergies:  Allergies  Allergen Reactions  . Sulfa Antibiotics Hives    History   Social History  . Marital Status: Married    Spouse Name: N/A    Number of Children:  2  . Years of Education: N/A   Occupational History  . 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 Smoking status: Never Smoker   . Smokeless tobacco: Not on file  . Alcohol Use: Yes  . Drug Use: No  . Sexual Activity: Not on file   Other Topics Concern  . 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   2 children-Age 82, 61 y/o    No family history on file.   Review of Systems:  See HPI for pertinent ROS. All other ROS negative.    Physical Exam: Blood pressure 142/94, pulse 84, temperature 98.1 F (36.7 C), temperature source Oral, resp. rate 18, height 6' 1.5" (1.867 m), weight 285 lb (129.275 kg)., Body mass index is 37.09 kg/(m^2). General:Male. Appears in no acute  distress. Neck: Supple. No thyromegaly. No lymphadenopathy. Lungs: Clear bilaterally to auscultation without wheezes, rales, or rhonchi. Breathing is unlabored. Heart: RRR with S1 S2. No murmurs, rubs, or gallops. Abdomen: Soft, non-tender, non-distended with normoactive bowel sounds. No hepatomegaly. No rebound/guarding. No obvious abdominal masses. Musculoskeletal:  Strength and tone normal for age. Extremities/Skin: Warm and dry.  Neuro: Alert and oriented X 3. Moves all extremities spontaneously. Gait is normal. CNII-XII grossly in tact. Psych:  Responds to questions appropriately with a normal affect.     ASSESSMENT AND PLAN:  33 y.o. year old male with  1. Anxiety Currently controlled with Celexa 40 mg daily and Valium 10 mg each bedtime when necessary. No prescription was given today as he was just recently given prescription on 11/16/13. Discussed again today that this medication will cause dependence and can be addicting. He is to try to wean this down and not use it every single night.  2. Hypertension Blood pressure is borderline today but has been well controlled. Continue current medications. He had CMET at his last visit 08/06/13.  3. Erectile dysfunction He has he used Levitra Cialis and Viagra in the past. However the only one of these medications we have any samples or savings cards for today is for Levitra. Therefore I have given him 2 bottles of Levitra which each containing 2 pills for total of 4 pills. Also gave him a savings cards. Also discussed this is a high dose and for him to definitely cut this in half - vardenafil (LEVITRA) 20 MG tablet; Take 1 tablet (20 mg total) by mouth daily as needed for erectile dysfunction.  Dispense: 4 tablet; Refill: 11  4. elevated LFTs: November 2012 he had an ultrasound which showed fatty liver. He also had mild hyperlipidemia in November 2012. We have been trying to control this with diet and exercise and weight loss. However we due  to recheck LFTs at his last visit 10/14 and they were still elevated. He is to continue to try to improve diet and exercise. Plan to recheck these again at his next visit and if they are not decreased will refer him back to GI for further evaluation.  If everything remains stable he can follow up in 6 months. Followup sooner if needed.   Marin Olp [place], [place], [facility] 11/19/2013 7:32 AM

## 2013-12-08 ENCOUNTER — Other Ambulatory Visit: Payer: Self-pay | Admitting: Physician Assistant

## 2013-12-13 ENCOUNTER — Other Ambulatory Visit: Payer: Self-pay | Admitting: Physician Assistant

## 2013-12-13 DIAGNOSIS — I10 Essential (primary) hypertension: Secondary | ICD-10-CM

## 2013-12-14 NOTE — Telephone Encounter (Signed)
Medication refilled per protocol. 

## 2014-01-20 ENCOUNTER — Other Ambulatory Visit: Payer: Self-pay | Admitting: Physician Assistant

## 2014-01-20 NOTE — Telephone Encounter (Signed)
Approved for #30+2 additional refills 

## 2014-01-20 NOTE — Telephone Encounter (Signed)
Last Rf 11/16/13 #30 + 1  Last OV 11/18/13.  OK refill?

## 2014-01-21 NOTE — Telephone Encounter (Signed)
Rx called in 

## 2014-04-09 ENCOUNTER — Telehealth: Payer: Self-pay | Admitting: Physician Assistant

## 2014-04-09 MED ORDER — CITALOPRAM HYDROBROMIDE 40 MG PO TABS: 40.0000 mg | ORAL_TABLET | Freq: Every day | ORAL | Status: DC

## 2014-04-09 NOTE — Telephone Encounter (Signed)
Medication refilled per protocol. 

## 2014-04-09 NOTE — Telephone Encounter (Signed)
[phone]  Pt states he has lost the prescription citalopram (CELEXA) 40 MG tablet and he is needing this [place]yt[place]

## 2014-04-22 ENCOUNTER — Other Ambulatory Visit: Payer: Self-pay | Admitting: Physician Assistant

## 2014-04-22 ENCOUNTER — Telehealth: Payer: Self-pay | Admitting: Physician Assistant

## 2014-04-22 NOTE — Telephone Encounter (Signed)
?   OK to Refill  

## 2014-04-22 NOTE — Telephone Encounter (Signed)
Ok to refill??  Last office visit 11/18/2013.  Last refill 01/21/2014.

## 2014-04-22 NOTE — Telephone Encounter (Signed)
I have already stated approved for #30+2 refills and another section of my in basket.

## 2014-04-22 NOTE — Telephone Encounter (Signed)
Medication called to pharmacy. 

## 2014-04-22 NOTE — Telephone Encounter (Signed)
[phone]   PT is needing a refill on diazepam (VALIUM) 10 MG tablet

## 2014-04-22 NOTE — Telephone Encounter (Signed)
Approved for #30+2 additional refills 

## 2014-04-23 NOTE — Telephone Encounter (Signed)
Medication was called in on 04/22/2014.

## 2014-05-14 ENCOUNTER — Encounter: Payer: Self-pay | Admitting: Family Medicine

## 2014-05-14 ENCOUNTER — Ambulatory Visit (INDEPENDENT_AMBULATORY_CARE_PROVIDER_SITE_OTHER): Payer: BC Managed Care – PPO | Admitting: Family Medicine

## 2014-05-14 VITALS — BP 126/80 | HR 100 | Temp 97.7°F | Resp 16 | Ht 74.0 in | Wt 295.0 lb

## 2014-05-14 DIAGNOSIS — F988 Other specified behavioral and emotional disorders with onset usually occurring in childhood and adolescence: Secondary | ICD-10-CM

## 2014-05-14 MED ORDER — AMPHETAMINE-DEXTROAMPHET ER 20 MG PO CP24: 20.0000 mg | ORAL_CAPSULE | ORAL | Status: DC

## 2014-05-14 NOTE — Progress Notes (Signed)
Subjective:    Patient ID: Mark Burns, male    DOB: June 24, 1981, 33 y.o.   MRN: [id number]  HPI Patient has a history of ADD. He is currently not taking any medication. 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 However his inability to focus indeterminate amount of difficulty in that job and he had to step down from that position. 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 He has adhere to numerous IEP plans.  This was a grade to focus for him to read and comprehend and follow these plans. His inability to focus is creating a difficult time at work. He is interested in resuming the Adderall. Of note he is taking Valium as needed for insomnia. He is also taking hydrocodone as needed for chronic low back pain. We had a long discussion about possible habituation and dependency on these medications. I recommended he use the medication sparingly. Past Medical History  Diagnosis Date  . Anxiety   . Hypertension   . Erectile dysfunction   . Elevated LFTs 11/19/2013  . ADD (attention deficit disorder)    No past surgical history on file. Current Outpatient Prescriptions on File Prior to Visit  Medication Sig Dispense Refill  . citalopram (CELEXA) 40 MG tablet Take 1 tablet (40 mg total) by mouth daily.  30 tablet  1  . diazepam (VALIUM) 10 MG tablet TAKE 1 TABLET BY MOUTH AT BEDTIME  30 tablet  2  . HYDROcodone-acetaminophen (VICODIN) 5-500 MG per tablet Take 1 tablet by mouth every 6 (six) hours as needed for pain.  15 tablet  0  . losartan-hydrochlorothiazide (HYZAAR) 100-25 MG per tablet TAKE 1 TABLET BY MOUTH DAILY.  30 tablet  5  . tadalafil (CIALIS) 20 MG tablet Take 1 tablet (20 mg total) by mouth daily as needed for erectile dysfunction.  2 tablet  11  . vardenafil (LEVITRA) 20 MG tablet Take 1 tablet (20 mg total) by mouth daily as needed  for erectile dysfunction.  4 tablet  11   No current facility-administered medications on file prior to visit.   Allergies  Allergen Reactions  . Sulfa Antibiotics Hives   History   Social History  . Marital Status: Married    Spouse Name: N/A    Number of Children: 2  . Years of Education: N/A   Occupational History  . 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 Smoking status: Never Smoker   . Smokeless tobacco: Not on file  . Alcohol Use: Yes  . Drug Use: No  . Sexual Activity: Not on file   Other Topics Concern  . 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   2 children-Age 6, 35 y/o       Review of Systems  All other systems reviewed and are negative.      Objective:   Physical Exam  Vitals reviewed. Constitutional: He is oriented to person, place, and time.  Cardiovascular: Normal rate, regular rhythm and normal heart sounds.   No murmur heard. Pulmonary/Chest: Effort normal and breath sounds normal. No respiratory distress. He has no wheezes. He has no rales.  Neurological: He is alert and oriented to person, place, and time. He has normal reflexes. He displays normal reflexes. No cranial nerve deficit.  He exhibits normal muscle tone. Coordination normal.  Psychiatric: He has a normal mood and affect. His behavior is normal. Judgment and thought content normal.          Assessment & Plan:  1. ADD (attention deficit disorder) Resume Adderall Bexxar 20 mg by mouth every morning. Recheck in one month. Consider trazodone instead of Valium for insomnia and Consider gabapentin instead of Vicodin as needed for lower back pain. - amphetamine-dextroamphetamine (ADDERALL XR) 20 MG 24 hr capsule; Take 1 capsule (20 mg total) by mouth every morning.  Dispense: 30 capsule; Refill: 0

## 2014-05-19 ENCOUNTER — Ambulatory Visit: Payer: BC Managed Care – PPO | Admitting: Physician Assistant

## 2014-05-21 ENCOUNTER — Telehealth: Payer: Self-pay | Admitting: Physician Assistant

## 2014-05-21 NOTE — Telephone Encounter (Signed)
PT has called and stated that [facility] had told [facility] that we were needing to get prior approval for his amphetamine-dextroamphetamine (ADDERALL XR) 20 MG 24 hr capsule And pt was calling to check on that  [phone]

## 2014-05-22 NOTE — Telephone Encounter (Signed)
Call returned to patient.   States that insurance requires PA for Adderall.   Call placed to [facility] and PA submitted.   PA approved.   Case ID: [id number]  05/01/2014- 05/22/2015.

## 2014-06-04 ENCOUNTER — Other Ambulatory Visit: Payer: Self-pay | Admitting: Physician Assistant

## 2014-06-04 MED ORDER — LOSARTAN POTASSIUM-HCTZ 100-25 MG PO TABS: ORAL_TABLET | ORAL | Status: DC

## 2014-06-04 NOTE — Addendum Note (Signed)
Addended by: Sheral Flow on: 06/04/2014 05:42 PM   Modules accepted: Orders

## 2014-06-04 NOTE — Telephone Encounter (Signed)
Prescription failed to e-scribe.   Resubmitted.

## 2014-06-25 ENCOUNTER — Telehealth: Payer: Self-pay | Admitting: Physician Assistant

## 2014-06-25 DIAGNOSIS — F988 Other specified behavioral and emotional disorders with onset usually occurring in childhood and adolescence: Secondary | ICD-10-CM

## 2014-06-25 NOTE — Telephone Encounter (Signed)
LMTRC

## 2014-06-25 NOTE — Telephone Encounter (Signed)
Patient is calling to get rx for his adderall  [phone]

## 2014-06-28 MED ORDER — AMPHETAMINE-DEXTROAMPHET ER 20 MG PO CP24: 20.0000 mg | ORAL_CAPSULE | ORAL | Status: DC

## 2014-06-28 NOTE — Telephone Encounter (Signed)
ok 

## 2014-06-28 NOTE — Telephone Encounter (Signed)
Ok to refill 

## 2014-06-28 NOTE — Telephone Encounter (Signed)
Med refilled and pt aware to come and pick up

## 2014-07-03 ENCOUNTER — Other Ambulatory Visit: Payer: Self-pay | Admitting: Physician Assistant

## 2014-07-03 NOTE — Telephone Encounter (Signed)
Ok to refill??  Last office visit 05/14/2014.  Last refill 08/06/2013, #11 refills.

## 2014-07-06 NOTE — Telephone Encounter (Signed)
Approved for  #30+11 refills

## 2014-07-14 ENCOUNTER — Telehealth: Payer: Self-pay | Admitting: Family Medicine

## 2014-07-14 MED ORDER — DIAZEPAM 10 MG PO TABS: 10.0000 mg | ORAL_TABLET | Freq: Every evening | ORAL | Status: DC | PRN

## 2014-07-14 NOTE — Telephone Encounter (Signed)
Approved for #30+2 additional refills 

## 2014-07-14 NOTE — Telephone Encounter (Signed)
Rx request for Diazepam 10mg .  Last Rf 7/9 #30 + 2.  Last OV 7/31.  OK refill??

## 2014-07-14 NOTE — Telephone Encounter (Signed)
RX called in .

## 2014-07-28 ENCOUNTER — Telehealth: Payer: Self-pay | Admitting: Physician Assistant

## 2014-07-28 DIAGNOSIS — F988 Other specified behavioral and emotional disorders with onset usually occurring in childhood and adolescence: Secondary | ICD-10-CM

## 2014-07-28 NOTE — Telephone Encounter (Signed)
Last Rf 9/14 #30  Last OV 07/37/15  OK refill?

## 2014-07-28 NOTE — Telephone Encounter (Signed)
Patient calling to get rx for his adderall  [phone]

## 2014-07-29 MED ORDER — AMPHETAMINE-DEXTROAMPHET ER 20 MG PO CP24: 20.0000 mg | ORAL_CAPSULE | ORAL | Status: DC

## 2014-07-29 NOTE — Telephone Encounter (Signed)
ok 

## 2014-07-29 NOTE — Telephone Encounter (Signed)
RX ready for pt to pick up and he has been notified

## 2014-08-17 ENCOUNTER — Telehealth: Payer: Self-pay | Admitting: Physician Assistant

## 2014-08-17 DIAGNOSIS — B009 Herpesviral infection, unspecified: Secondary | ICD-10-CM

## 2014-08-17 NOTE — Telephone Encounter (Signed)
Patient is calling for refill on his valtrex and is needing it to go to a different pharmacy  [facility] [address]

## 2014-08-19 NOTE — Telephone Encounter (Signed)
I don't see where we have ever ordered this.  Please advise?

## 2014-08-19 NOTE — Telephone Encounter (Signed)
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 Can order Valtrex 1 g 2 by mouth every 12 hours 2 doses #4 with 3 refills

## 2014-08-20 MED ORDER — VALACYCLOVIR HCL 1 G PO TABS: ORAL_TABLET | ORAL | Status: DC

## 2014-08-20 NOTE — Telephone Encounter (Signed)
rx sent

## 2014-08-31 ENCOUNTER — Telehealth: Payer: Self-pay | Admitting: Physician Assistant

## 2014-08-31 DIAGNOSIS — F988 Other specified behavioral and emotional disorders with onset usually occurring in childhood and adolescence: Secondary | ICD-10-CM

## 2014-08-31 NOTE — Telephone Encounter (Signed)
Last Rf 10/15 #30  Last OV 05/14/14  OK refill?

## 2014-08-31 NOTE — Telephone Encounter (Signed)
Patient is calling to get refill on his adderall  Please call him at [phone] when ready

## 2014-09-01 MED ORDER — AMPHETAMINE-DEXTROAMPHET ER 20 MG PO CP24: 20.0000 mg | ORAL_CAPSULE | ORAL | Status: DC

## 2014-09-01 NOTE — Telephone Encounter (Signed)
Please print these.

## 2014-09-01 NOTE — Telephone Encounter (Signed)
Find out whether he wants [place] to print future prescriptions to use for the next 3 months or not.  Last prescription was 07/29/14 for #30+0. Can either just print one for #30+0 or can print 3--one for   09/01/14, 10/01/14, 11/01/2014. Send me a note back so that I will know how many Rx we give--- for me to update my papers.

## 2014-09-01 NOTE — Telephone Encounter (Signed)
Pt called and stated that he does want three prescriptions printed out so that he does not have to come back or call until Feb.

## 2014-09-01 NOTE — Telephone Encounter (Signed)
rx printed.  Pt made aware.

## 2014-09-08 ENCOUNTER — Other Ambulatory Visit: Payer: Self-pay | Admitting: Physician Assistant

## 2014-09-08 ENCOUNTER — Encounter: Payer: Self-pay | Admitting: Family Medicine

## 2014-09-08 NOTE — Telephone Encounter (Signed)
Medication refill for one time only.  Patient needs to be seen.  Letter sent for patient to call and schedule 

## 2014-09-20 ENCOUNTER — Encounter: Payer: Self-pay | Admitting: Physician Assistant

## 2014-09-20 ENCOUNTER — Ambulatory Visit (INDEPENDENT_AMBULATORY_CARE_PROVIDER_SITE_OTHER): Payer: BC Managed Care – PPO | Admitting: Physician Assistant

## 2014-09-20 VITALS — BP 124/84 | HR 84 | Temp 98.4°F | Resp 18 | Wt 274.0 lb

## 2014-09-20 DIAGNOSIS — J988 Other specified respiratory disorders: Secondary | ICD-10-CM

## 2014-09-20 DIAGNOSIS — B9689 Other specified bacterial agents as the cause of diseases classified elsewhere: Principal | ICD-10-CM

## 2014-09-20 MED ORDER — AZITHROMYCIN 250 MG PO TABS: ORAL_TABLET | ORAL | Status: DC

## 2014-09-20 NOTE — Progress Notes (Signed)
    Patient ID: Mark Burns MRN: [id number], DOB: 24-Feb-1981, 33 y.o. Date of Encounter: 09/20/2014, 3:52 PM    Chief Complaint:  Chief Complaint  Patient presents with  . chect cold x 2 weeks    wants flu shot if OK     HPI: 33 y.o. year old male with the above symptoms. Says that he has had a lot of chest congestion and cough but also some feels congested across his nasal area. His throat has been a little bit sore but not significantly sore. Blowing out minimal mucus from his nose. No fevers or chills.     Home Meds:   Outpatient Prescriptions Prior to Visit  Medication Sig Dispense Refill  . amphetamine-dextroamphetamine (ADDERALL XR) 20 MG 24 hr capsule Take 1 capsule (20 mg total) by mouth every morning. 30 capsule 0  . CIALIS 5 MG tablet TAKE 1 TABLET (5 MG TOTAL) BY MOUTH DAILY AS NEEDED FOR ERECTILE DYSFUNCTION. 30 tablet 11  . citalopram (CELEXA) 40 MG tablet TAKE 1 TABLET (40 MG TOTAL) BY MOUTH DAILY. 30 tablet 0  . diazepam (VALIUM) 10 MG tablet Take 1 tablet (10 mg total) by mouth at bedtime as needed for anxiety. 30 tablet 2  . HYDROcodone-acetaminophen (VICODIN) 5-500 MG per tablet Take 1 tablet by mouth every 6 (six) hours as needed for pain. 15 tablet 0  . losartan-hydrochlorothiazide (HYZAAR) 100-25 MG per tablet TAKE 1 TABLET BY MOUTH DAILY. 30 tablet 5  . valACYclovir (VALTREX) 1000 MG tablet Two tablets by mouth every 12 hours x two doses 4 tablet 3  . vardenafil (LEVITRA) 20 MG tablet Take 1 tablet (20 mg total) by mouth daily as needed for erectile dysfunction. 4 tablet 11   No facility-administered medications prior to visit.    Allergies:  Allergies  Allergen Reactions  . Sulfa Antibiotics Hives      Review of Systems: See HPI for pertinent ROS. All other ROS negative.    Physical Exam: Blood pressure 124/84, pulse 84, temperature 98.4 F (36.9 C), temperature source Oral, resp. rate 18, weight 274 lb (124.286 kg)., Body mass index is 35.16  kg/(m^2). General:  Appears in no acute distress. HEENT: Normocephalic, atraumatic, eyes without discharge, sclera non-icteric, nares are without discharge. Bilateral auditory canals clear, TM's are without perforation, pearly grey and translucent with reflective cone of light bilaterally. Oral cavity moist, posterior pharynx without exudate, erythema, peritonsillar abscess. No tenderness with percussion of frontal or maxillary sinuses bilaterally. Neck: Supple. No thyromegaly. No lymphadenopathy. Lungs: Clear bilaterally to auscultation without wheezes, rales, or rhonchi. Breathing is unlabored. Heart: Regular rhythm. No murmurs, rubs, or gallops. Msk:  Strength and tone normal for age. Extremities/Skin: Warm and dry. Neuro: Alert and oriented X 3. Moves all extremities spontaneously. Gait is normal. CNII-XII grossly in tact. Psych:  Responds to questions appropriately with a normal affect.     ASSESSMENT AND PLAN:  33 y.o. year old male with  1. Bacterial respiratory infection - azithromycin (ZITHROMAX) 250 MG tablet; Day 1: Take 2 daily.  Days 2-5: Take 1 daily.  Dispense: 6 tablet; Refill: 0 Over-the-counter decongestants and cough medications as needed for symptom relief I'll of the symptoms do not resolve within 1 week after completion of antibiotics. Note given for out of work tomorrow if he needs to be out of work.  [address] [place], [place], [facility] 09/20/2014 3:52 PM

## 2014-09-28 ENCOUNTER — Telehealth: Payer: Self-pay | Admitting: Physician Assistant

## 2014-09-28 NOTE — Telephone Encounter (Signed)
Pt called.  Yes he remembers now does have his three month RX's given at last RF

## 2014-09-28 NOTE — Telephone Encounter (Signed)
Patient calling to get refill on his adderall, he is going out of town on Friday and would like to get it before then if possible  [phone]

## 2014-10-06 ENCOUNTER — Other Ambulatory Visit: Payer: Self-pay | Admitting: Physician Assistant

## 2014-10-06 NOTE — Telephone Encounter (Signed)
Medication refilled per protocol. 

## 2014-10-25 ENCOUNTER — Other Ambulatory Visit: Payer: Self-pay | Admitting: Physician Assistant

## 2014-10-25 NOTE — Telephone Encounter (Signed)
Can give # 30 + 0

## 2014-10-25 NOTE — Telephone Encounter (Signed)
Pt transferring to [place].  Has new pt appt 11/23/14.  LRF 9/30 #30 + 2.  LOV 09/20/14  OK refill?

## 2014-11-12 ENCOUNTER — Other Ambulatory Visit: Payer: Self-pay | Admitting: Physician Assistant

## 2014-11-12 NOTE — Telephone Encounter (Signed)
One last refill as pt transferring to another practice

## 2014-11-16 ENCOUNTER — Telehealth: Payer: Self-pay | Admitting: Physician Assistant

## 2014-11-16 MED ORDER — TADALAFIL 5 MG PO TABS: 5.0000 mg | ORAL_TABLET | Freq: Every day | ORAL | Status: DC | PRN

## 2014-11-16 NOTE — Telephone Encounter (Signed)
One last refill pt transferring to another practice

## 2014-11-16 NOTE — Telephone Encounter (Signed)
Patient requesting refill on his cialis  [facility] [place]  [phone]

## 2014-11-23 ENCOUNTER — Encounter: Payer: Self-pay | Admitting: Internal Medicine

## 2014-11-23 ENCOUNTER — Ambulatory Visit (INDEPENDENT_AMBULATORY_CARE_PROVIDER_SITE_OTHER): Payer: BC Managed Care – PPO | Admitting: Internal Medicine

## 2014-11-23 VITALS — BP 146/86 | HR 86 | Temp 98.4°F | Ht 74.0 in | Wt 281.0 lb

## 2014-11-23 DIAGNOSIS — B001 Herpesviral vesicular dermatitis: Secondary | ICD-10-CM | POA: Insufficient documentation

## 2014-11-23 DIAGNOSIS — I10 Essential (primary) hypertension: Secondary | ICD-10-CM

## 2014-11-23 DIAGNOSIS — F411 Generalized anxiety disorder: Secondary | ICD-10-CM | POA: Insufficient documentation

## 2014-11-23 DIAGNOSIS — F909 Attention-deficit hyperactivity disorder, unspecified type: Secondary | ICD-10-CM

## 2014-11-23 DIAGNOSIS — F900 Attention-deficit hyperactivity disorder, predominantly inattentive type: Secondary | ICD-10-CM

## 2014-11-23 DIAGNOSIS — F988 Other specified behavioral and emotional disorders with onset usually occurring in childhood and adolescence: Secondary | ICD-10-CM

## 2014-11-23 DIAGNOSIS — L219 Seborrheic dermatitis, unspecified: Secondary | ICD-10-CM

## 2014-11-23 LAB — COMPREHENSIVE METABOLIC PANEL
ALK PHOS: 49 U/L (ref 39–117)
ALT: 49 U/L (ref 0–53)
AST: 28 U/L (ref 0–37)
Albumin: 5 g/dL (ref 3.5–5.2)
BILIRUBIN TOTAL: 0.6 mg/dL (ref 0.2–1.2)
BUN: 25 mg/dL — AB (ref 6–23)
CALCIUM: 9.9 mg/dL (ref 8.4–10.5)
CO2: 26 mEq/L (ref 19–32)
Chloride: 101 mEq/L (ref 96–112)
Creatinine, Ser: 0.96 mg/dL (ref 0.40–1.50)
GFR: 94.49 mL/min (ref >60.00)
Glucose, Bld: 92 mg/dL (ref 70–99)
Potassium: 4 mEq/L (ref 3.5–5.1)
Sodium: 136 mEq/L (ref 135–145)
Total Protein: 7.9 g/dL (ref 6.0–8.3)

## 2014-11-23 LAB — CBC WITH DIFFERENTIAL/PLATELET
Basophils Absolute: 0 10*3/uL (ref 0.0–0.1)
Basophils Relative: 0.3 % (ref 0.0–3.0)
Eosinophils Absolute: 0.2 10*3/uL (ref 0.0–0.7)
Eosinophils Relative: 1.8 % (ref 0.0–5.0)
HCT: 41.1 % (ref 39.0–52.0)
Hemoglobin: 14.3 g/dL (ref 13.0–17.0)
Lymphocytes Relative: 34.4 % (ref 12.0–46.0)
Lymphs Abs: 3.3 10*3/uL (ref 0.7–4.0)
MCHC: 34.8 g/dL (ref 30.0–36.0)
MCV: 88.6 fl (ref 78.0–100.0)
MONO ABS: 0.6 10*3/uL (ref 0.1–1.0)
MONOS PCT: 6.7 % (ref 3.0–12.0)
Neutro Abs: 5.5 10*3/uL (ref 1.4–7.7)
Neutrophils Relative %: 56.8 % (ref 43.0–77.0)
PLATELETS: 256 10*3/uL (ref 150.0–400.0)
RBC: 4.64 Mil/uL (ref 4.22–5.81)
RDW: 12.5 % (ref 11.5–15.5)
WBC: 9.6 10*3/uL (ref 4.0–10.5)

## 2014-11-23 LAB — LIPID PANEL
CHOL/HDL RATIO: 6
Cholesterol: 281 mg/dL — ABNORMAL HIGH (ref 0–200)
HDL: 48.3 mg/dL (ref >39.00)

## 2014-11-23 LAB — LDL CHOLESTEROL, DIRECT: LDL DIRECT: 151 mg/dL

## 2014-11-23 LAB — T4, FREE: FREE T4: 0.74 ng/dL (ref 0.60–1.60)

## 2014-11-23 MED ORDER — HYDROCORTISONE 2.5 % EX CREA: TOPICAL_CREAM | Freq: Three times a day (TID) | CUTANEOUS | Status: DC | PRN

## 2014-11-23 MED ORDER — AMPHETAMINE-DEXTROAMPHET ER 20 MG PO CP24: 20.0000 mg | ORAL_CAPSULE | ORAL | Status: DC

## 2014-11-23 MED ORDER — TRAZODONE HCL 50 MG PO TABS
50.0000 mg | ORAL_TABLET | Freq: Every day | ORAL | Status: DC
Start: 2014-11-23 — End: 2015-05-27

## 2014-11-23 MED ORDER — KETOCONAZOLE 2 % EX SHAM: 1.0000 "application " | MEDICATED_SHAMPOO | CUTANEOUS | Status: DC

## 2014-11-23 NOTE — Assessment & Plan Note (Signed)
Will try ketoconazole shampoo and hydrocortisone cream

## 2014-11-23 NOTE — Assessment & Plan Note (Signed)
BP Readings from Last 3 Encounters:  11/23/14 146/86  09/20/14 124/84  05/14/14 126/80   Generally controlled--- at home and other office No change for now

## 2014-11-23 NOTE — Assessment & Plan Note (Signed)
This diagnosis seems warranted Symptoms for as long as he can remember----coexisting anxiety disorder is common Will continue the med for now

## 2014-11-23 NOTE — Patient Instructions (Signed)
Please stop the diazepam. Start trazodone 50mg  at bedtime. Increase to 100mg  in 3 days if not helpful. After a week, increase to 150mg  (3 tabs) if still not sleeping well. Let me know in 3 weeks if that dose isn't working either.

## 2014-11-23 NOTE — Progress Notes (Signed)
Pre visit review using our clinic review tool, if applicable. No additional management support is needed unless otherwise documented below in the visit note. 

## 2014-11-23 NOTE — Progress Notes (Signed)
Subjective:    Patient ID: Mark Burns, male    DOB: [birth year], 34 y.o.   MRN: [id number]  HPI Here to establish Had been going to [facility] but now lives around here  On Rx for ADHD First diagnosed in graduate school--but feels he has struggled with focus all his life Clear problems even in elementary school Had panic attacks from attention problems--always anxious Feels the adderall does help his attention--he hasn't noticed any problems with this Also depressed--- "most of my life" Gets blues easily-- has to work to "see the bright side" Saw psychiatrist in November-- diagnosed as bipolar but never took the medicine he prescribed He isn't convinced the citalopram helps that much--- but if he holds it he starts crying and feeling down Takes valium at night to help sleep Some stress in marriage--- thinks his wife is too controlling at times  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 amphetamine-dextroamphetamine (ADDERALL XR) 20 MG 24 hr capsule Take 1 capsule (20 mg total) by mouth every morning. 30 capsule 0  . citalopram (CELEXA) 40 MG tablet TAKE 1 TABLET (40 MG TOTAL) BY MOUTH DAILY. 30 tablet 0  . diazepam (VALIUM) 10 MG tablet TAKE 1 TABLET BY MOUTH AT BEDTIME 30 tablet 0  . HYDROcodone-acetaminophen (VICODIN) 5-500 MG per tablet Take 1 tablet by mouth every 6 (six) hours as needed for pain. 15 tablet 0  . losartan-hydrochlorothiazide (HYZAAR) 100-25 MG per tablet TAKE 1 TABLET BY MOUTH  DAILY. 30 tablet 5  . tadalafil (CIALIS) 5 MG tablet Take 1 tablet (5 mg total) by mouth daily as needed for erectile dysfunction. 30 tablet 0  . valACYclovir (VALTREX) 1000 MG tablet Two tablets by mouth every 12 hours x two doses 4 tablet 3   No current facility-administered medications on file prior to visit.    Allergies  Allergen Reactions  . Sulfa Antibiotics Hives    Past Medical History  Diagnosis Date  . Generalized anxiety disorder   . Hypertension   . Erectile dysfunction   . Elevated LFTs 11/19/2013  . ADHD, predominantly inattentive type     Past Surgical History  Procedure Laterality Date  . Vasectomy  2014    No family history on file.   Review of Systems  Constitutional: Negative for fatigue.       Has lost some weight over time  HENT: Negative for hearing loss.   Eyes: Negative for visual disturbance.  Respiratory: Negative for cough, chest tightness and shortness of breath.   Cardiovascular: Negative for chest pain, palpitations and leg swelling.  Gastrointestinal: Positive for anal bleeding. Negative for nausea, vomiting and abdominal pain.       Bad hemorrhoids  Genitourinary: Negative for urgency, frequency and difficulty urinating.  Skin: Positive for rash.       Rash behind ears-- and dandruff Got cream for fingers   Neurological: Positive for headaches. Negative for dizziness, syncope, weakness and light-headedness.  Psychiatric/Behavioral: Positive for sleep disturbance and dysphoric mood. The patient is nervous/anxious.  Objective:   Physical Exam  Constitutional: He appears well-developed. No distress.  Neck: Normal range of motion. Neck supple. No thyromegaly present.  Cardiovascular: Normal rate, regular rhythm, normal heart sounds and intact distal pulses.  Exam reveals no gallop.   No murmur heard. Pulmonary/Chest: Effort normal and breath sounds normal. No respiratory distress. He has no wheezes. He has no rales.  Abdominal:  Soft. There is no tenderness.  Musculoskeletal: He exhibits no edema or tenderness.  Lymphadenopathy:    He has no cervical adenopathy.  Psychiatric: He has a normal mood and affect. His behavior is normal.  Really controlled with neutral mood Affect appropriate          Assessment & Plan:

## 2014-11-23 NOTE — Assessment & Plan Note (Signed)
Lifelong symptoms I don't see the bipolar Seems stable now Continue meds except change for sleep---to trazodone Will set up with counseling per his request

## 2014-11-29 ENCOUNTER — Telehealth: Payer: Self-pay | Admitting: Internal Medicine

## 2014-11-29 NOTE — Telephone Encounter (Signed)
emmi emailed °

## 2014-12-03 ENCOUNTER — Telehealth: Payer: Self-pay | Admitting: Internal Medicine

## 2014-12-03 NOTE — Telephone Encounter (Signed)
Pt came in office today to request Sinfidel Citrate instead of Cialis due cost difference. Pt wants to know if he can switch to Sinfidel Citrate?  [facility]

## 2014-12-06 ENCOUNTER — Other Ambulatory Visit: Payer: Self-pay | Admitting: Physician Assistant

## 2014-12-06 NOTE — Telephone Encounter (Signed)
Spoke to pharmacist and was advised that Sildenafil 100 mg is available in brand name only at this time. Pharmacist Vicente Males) tried to process the Sildenafil 20 mg and received a message from insurance company that a prior authorization is required on this medication.

## 2014-12-06 NOTE — Telephone Encounter (Signed)
Let him know that Rob at [facility] will sell the sildenafil 20mg  at cash price of $80 for #50 of the 20mg  (which is the equivalent of 10 full doses of the brand sildenafil/viagra) We can send Rx there if he wants Insurance will not pay for it (unless he has pulmonary hypertension ---which he doesn't)

## 2014-12-06 NOTE — Telephone Encounter (Signed)
Check with pharmacist about whether sildenafil 100mg  is covered. If so, can send Rx for #10 x 5 1/2-1 daily prn before sex   If necessary, can use sildenafil 20mg  3-5 tabs before sex daily prn #50 x 5 This may be cheaper

## 2014-12-06 NOTE — Telephone Encounter (Signed)
Patient has transfered from practice.  Medication refill denied

## 2014-12-07 MED ORDER — SILDENAFIL CITRATE 20 MG PO TABS: 60.0000 mg | ORAL_TABLET | Freq: Every day | ORAL | Status: DC | PRN

## 2014-12-07 NOTE — Telephone Encounter (Signed)
Detailed message left on voicemail that script has been sent to the pharmacy.

## 2014-12-07 NOTE — Telephone Encounter (Signed)
Left message on voice mail  to call back

## 2014-12-07 NOTE — Telephone Encounter (Signed)
Let him know I sent the prescription for him

## 2014-12-07 NOTE — Telephone Encounter (Signed)
Patient notified as instructed by telephone and verbalized understanding. Patient stated that he would like a script sent to [facility] for the Sildenafil with on instructions how to take. Patient will check with th pharmacy later today.

## 2014-12-16 ENCOUNTER — Ambulatory Visit: Payer: BC Managed Care – PPO | Admitting: Psychology

## 2014-12-17 ENCOUNTER — Ambulatory Visit: Payer: BC Managed Care – PPO | Admitting: Psychology

## 2014-12-23 ENCOUNTER — Ambulatory Visit: Payer: Self-pay | Admitting: Psychology

## 2014-12-30 ENCOUNTER — Other Ambulatory Visit: Payer: Self-pay | Admitting: Physician Assistant

## 2014-12-30 NOTE — Telephone Encounter (Signed)
Approved: 1 year okay

## 2014-12-31 NOTE — Telephone Encounter (Signed)
rx sent to pharmacy by e-script  

## 2015-01-04 ENCOUNTER — Other Ambulatory Visit: Payer: Self-pay | Admitting: *Deleted

## 2015-01-04 DIAGNOSIS — F988 Other specified behavioral and emotional disorders with onset usually occurring in childhood and adolescence: Secondary | ICD-10-CM

## 2015-01-04 MED ORDER — AMPHETAMINE-DEXTROAMPHET ER 20 MG PO CP24: 20.0000 mg | ORAL_CAPSULE | ORAL | Status: DC

## 2015-01-04 NOTE — Telephone Encounter (Signed)
Patient left a voicemail requesting a refill on his Adderall. Please call patient when ready for pickup.

## 2015-01-05 NOTE — Telephone Encounter (Signed)
Spoke with patient and advised rx ready for pick-up and it will be at the front desk.  

## 2015-01-25 ENCOUNTER — Other Ambulatory Visit: Payer: Self-pay | Admitting: Physician Assistant

## 2015-01-25 NOTE — Telephone Encounter (Signed)
Refill denied.  Pt has transferred to another office.

## 2015-02-05 NOTE — Discharge Summary (Signed)
PATIENT NAME:  Mark Burns, Mark Burns MR#:  [id number] DATE OF BIRTH:  1980/10/31  DATE OF ADMISSION:  10/24/2013 DATE OF DISCHARGE:  10/26/2013  ADMISSION DIAGNOSES: Right arm cellulitis.   DISCHARGE DIAGNOSES:  1. Right arm olecranon bursitis/cellulitis.  2. Hypertension.   CONSULTATIONS: Dr. Sabra Heck.   CT SCAN OF THE ELBOW: No findings of osteomyelitis. There was septic olecranon bursitis with significant surrounding cellulitis.   BLOOD CULTURE: Negative to date.   HOSPITAL COURSE: A 34 year old male who presented with right elbow pain. Found to have olecranon bursitis. For further details, please refer to the H and P.  1. Right arm cellulitis/olecranon bursitis: The patient was admitted with IV vancomycin. Dr. Sabra Heck was consulted after we received the CT scan report. Dr. Sabra Heck recommended IV antibiotics. He had no area to I and D at this time. He did quite well with IV antibiotics. He has good adequate range of motion, and cellulitis is much improved. He still has some mild swelling, but it has dramatically improved. The patient is allergic to BACTRIM. He was on doxycycline as an outpatient but only received 2 days of this, so we will continue on doxycycline and Keflex as an outpatient.  2. Hypertension: The patient will continue on his outpatient medications.  3. Headache: The patient was complaining of a headache, but he had no meningeal signs. I suspect this is multifactorial in nature related to sleep deprivation and dehydration.   DISCHARGE MEDICATIONS:  1. HCTZ/losartan 25/100 daily.  2. Valtrex 1 g 4 times a day p.r.n.  3. Oxycodone 5 mg q.4 hours p.r.n. pain, #30.  4. Keflex 500 mg t.i.d. for 10 days.  5. Doxycycline 100 mg b.i.d. for 10 days.   DISCHARGE DIET: Low sodium.   DISCHARGE ACTIVITY: As tolerated.   DISCHARGE FOLLOWUP: The patient has a followup on Wednesday with Dr. Earnestine Leys.   The patient is medically stable for discharge.    TIME SPENT: 35 minutes.    ____________________________ Donell Beers. Benjie Karvonen, MD spm:gb D: 10/26/2013 14:03:03 ET T: 10/26/2013 21:21:20 ET JOB#: [id number]  cc: Polina Burmaster P. Benjie Karvonen, MD, <Dictator> Mark Breed, MD Donell Beers Camaryn Lumbert MD ELECTRONICALLY SIGNED 10/27/2013 13:34

## 2015-02-05 NOTE — Consult Note (Signed)
Chief Complaint:  Subjective/Chief Complaint Right arm pain   VITAL SIGNS/ANCILLARY NOTES: **Vital Signs.:   11-Jan-15 05:00  Vital Signs Type Routine  Temperature Temperature (F) 98  Celsius 36.6  Pulse Pulse 75  Respirations Respirations 18  Systolic BP Systolic BP 338  Diastolic BP (mmHg) Diastolic BP (mmHg) 81  Mean BP 102  Pulse Ox % Pulse Ox % 95  Pulse Ox Activity Level  At rest  Oxygen Delivery Room Air/ 21 %   Brief Assessment:  Additional Physical Exam Reddness and swelling of right arm better.  Less angry looking.  circulation/sensation/motor function good distally, small amount of fluid in bursa.  No drainage. afebrile and white blood count normal   Lab Results: Routine Chem:  11-Jan-15 05:03   Glucose, Serum  111  BUN  21  Creatinine (comp) 1.11  Sodium, Serum 136  Potassium, Serum 3.5  Chloride, Serum 102  CO2, Serum 31  Calcium (Total), Serum 9.0  Anion Gap  3  Osmolality (calc) 276  eGFR (African American) >60  eGFR (Non-African American) >60 (eGFR values <20m/min/1.73 m2 may be an indication of chronic kidney disease (CKD). Calculated eGFR is useful in patients with stable renal function. The eGFR calculation will not be reliable in acutely ill patients when serum creatinine is changing rapidly. It is not useful in  patients on dialysis. The eGFR calculation may not be applicable to patients at the low and high extremes of body sizes, pregnant women, and vegetarians.)  Routine Hem:  11-Jan-15 05:03   WBC (CBC) 8.4  RBC (CBC)  3.87  Hemoglobin (CBC)  12.0  Hematocrit (CBC)  33.9  Platelet Count (CBC) 254  MCV 88  MCH 31.0  MCHC 35.5  RDW 12.5  Neutrophil % 58.2  Lymphocyte % 28.3  Monocyte % 10.3  Eosinophil % 2.1  Basophil % 1.1  Neutrophil # 4.9  Lymphocyte # 2.4  Monocyte # 0.9  Eosinophil # 0.2  Basophil # 0.1 (Result(s) reported on 25 Oct 2013 at 05:56AM.)   Assessment/Plan:  Assessment/Plan:  Assessment Right arm cellulitis  with bursitis   Plan IV antibiotics overnight Probable discharge tomorrow on Keflex [id number] and Septra DS 1 bid  return to clinic to see me middle of week.   Electronic Signatures: MiPark BreedMD)  (Signed 11-Jan-15 13:58)  Authored: Chief Complaint, VITAL SIGNS/ANCILLARY NOTES, Brief Assessment, Lab Results, Assessment/Plan   Last Updated: 11-Jan-15 13:58 by MiPark BreedMD)

## 2015-02-05 NOTE — Consult Note (Signed)
Brief Consult Note: Diagnosis: Cellulitis right arm/elbow.   Patient was seen by consultant.   Recommend to proceed with surgery or procedure.   Recommend further assessment or treatment.   Orders entered.   Discussed with Attending MD.   Comments: 34 year old male admitted for cellulitis right forearm and elbow region.  Had a scratch on his elbow which turned into an infection.  Outpatient oral doxycycline did not help him so he is admitted for IV antibiotics.  He is on vancomycin which is appropriate since there is a good chance that this is a MRSA infection. There is no drainage or culturable material at this time.   Exam:  swelling and reddness right elbow down forearm to hand. circulation/sensation/motor function good and skin intact.  Healed scratch over olecranon bursa.  Bursa mildly swollen.  Not ready for I&d.    X-rays: ct scan consistent with olecranon bursitis  Rx:  IV vancomycin        moist heat        I&D if bursa points more.  Electronic Signatures: Park Breed (MD)  (Signed 10-Jan-15 15:33)  Authored: Brief Consult Note   Last Updated: 10-Jan-15 15:33 by Park Breed (MD)

## 2015-02-05 NOTE — H&P (Signed)
PATIENT NAME:  Mark Burns, Mark Burns MR#:  [id number] DATE OF BIRTH:  Apr 12, 1981  DATE OF ADMISSION:  10/24/2013  PRIMARY CARE PHYSICIAN: None.   REFERRING PHYSICIAN: Dr. Elyn Peers.   CHIEF COMPLAINT: Right arm swelling.    HISTORY OF PRESENT ILLNESS: Mark Burns is a 34 year old male with no past medical history, presented to the Emergency Department with complaints of right arm swelling and redness, started about four days back. The patient initially had a scratch which gradually started worsening with increased erythema.  Concerning this, went to the [facility] where the patient was given doxycycline. Despite taking this medication, the patient continued to have worsening of the redness. The patient was informed to come to the Emergency Department if develops any fever, increased redness. The patient started to experience fever of 100.9, associated with nausea and vomiting. While waiting in the Emergency Department, the patient noted to have significant increase in size of the redness. The patient was given clindamycin in the Emergency Department.   PAST MEDICAL HISTORY: Hypertension, chronic back pain.   ALLERGIES:  SULFA.   HOME MEDICATIONS: Losartan/hydrochlorothiazide 125 mg p.o. daily.   SOCIAL HISTORY: No history of smoking, drinking alcohol or using illicit drugs. Works as a Pharmacist, hospital.   FAMILY HISTORY: Strong family history of diabetes mellitus.   REVIEW OF SYSTEMS: All have been reviewed, found to be negative, except as mentioned in the history of present illness.   PHYSICAL EXAMINATION:  GENERAL: This is a well-built, well-nourished, age-appropriate male lying down in the bed, not in distress.  VITAL SIGNS: Temperature 99.5, pulse 81, blood pressure 141/81, respiratory rate of 18, oxygen saturation is 93% on room air.  HEENT: Head normocephalic, atraumatic. There is no sclerae icterus. Conjunctivae normal. Pupils equal and react to light. Extraocular movements are intact. Mucous  membranes moist. No pharyngeal erythema.  NECK: Supple. No lymphadenopathy. No JVD. No carotid bruit.  CHEST: Has no focal tenderness.  LUNGS: Bilaterally clear to auscultation.  HEART: S1, S2 regular. No murmurs are heard.  ABDOMEN: Bowel sounds present. Soft, nontender, nondistended. No hepatosplenomegaly.  EXTREMITIES: Right upper extremity with extensive redness and swelling starting from mid upper arm to forearm involving the elbow. Has good range of motion.  MUSCULOSKELETAL: Good range of motion in all extremities.  NEUROLOGIC: The patient is alert, oriented to place, person and time. Cranial nerves II through XII intact. Motor 5/5 in upper and lower extremities.   LABS: CBC: WBC of 11.3, hemoglobin 13, platelet count of 311.   CMP is completely within normal limits. Has elevated LFTs of ALT 93, AST 56.   ASSESSMENT AND PLAN: Mark Burns is a 34 year old male who comes to the Emergency Department with right arm cellulitis.  1.  Right arm cellulitis. Will obtain CT of the right arm concerning about the extension of the swelling. Keep the patient on vancomycin.  2.  Hypertension. Continue with the losartan.  Hold the hydrochlorothiazide.  3.  Keep the patient on deep vein thrombosis prophylaxis with Lovenox.   TIME SPENT: 45 minutes.    ____________________________ Monica Becton, MD pv:NTS D: 10/24/2013 02:26:32 ET T: 10/24/2013 02:36:43 ET JOB#: [id number]  cc: Monica Becton, MD, <Dictator> Monica Becton MD ELECTRONICALLY SIGNED 11/08/2013 21:12

## 2015-02-07 ENCOUNTER — Other Ambulatory Visit: Payer: Self-pay | Admitting: Internal Medicine

## 2015-02-07 DIAGNOSIS — F988 Other specified behavioral and emotional disorders with onset usually occurring in childhood and adolescence: Secondary | ICD-10-CM

## 2015-02-07 MED ORDER — AMPHETAMINE-DEXTROAMPHET ER 20 MG PO CP24: 20.0000 mg | ORAL_CAPSULE | ORAL | Status: DC

## 2015-02-07 NOTE — Telephone Encounter (Signed)
Pt came in office this morning requesting Adderral refill  [phone]

## 2015-02-07 NOTE — Telephone Encounter (Signed)
Pt last seen 11/23/14. Pt requesting rx for Adderall. Call when ready for pick up.

## 2015-02-07 NOTE — Telephone Encounter (Signed)
PLEASE NOTE: All timestamps contained within this report are represented as [place] Standard Time. CONFIDENTIALTY NOTICE: This fax transmission is intended only for the addressee. It contains information that is legally privileged, confidential or otherwise protected from use or disclosure. If you are not the intended recipient, you are strictly prohibited from reviewing, disclosing, copying using or disseminating any of this information or taking any action in reliance on or regarding this information. If you have received this fax in error, please notify us immediately by telephone so that we can arrange for its return to [place]. Phone: [phone], Toll-Free: [phone], Fax: [phone] Page: 1 of 1 Call Id: [id number] [place] Patient Name: Mark Burns Gender: Male DOB: 14-Aug-1981 Age: 35 Y 42 M 24 D Return Phone Number: [phone] (Primary) Address: [place] City/State/Zip: [place] [address] Client [place] [facility] - Client Client Site [facility] Physician Viviana Simpler Contact Type Call Call Type Triage / [place] Name Theoren Relationship To Patient Self Return Phone Number [phone] (Primary) Chief Complaint Medication Question (non symptomatic) Initial Comment Caller states he meant to call earlier about his Adderall refill but forgot, now he is out of medication. Nurse Assessment Guidelines Guideline Title Affirmed Question Affirmed Notes Nurse Date/Time (Eastern Time) Disp. Time Eilene Ghazi Time) Disposition Final User 02/04/2015 5:36:39 PM Attempt made - message left Vella Raring 02/04/2015 5:45:53 PM Attempt made - message left Vella Raring 02/04/2015 5:53:43 PM FINAL ATTEMPT MADE - message left Yes Robina Ade, RN, Caryl Pina After Care Instructions Given Call Event Type User Date / Time Description

## 2015-02-07 NOTE — Telephone Encounter (Signed)
Left message on machine that rx is ready for pick-up, and it will be at our front desk.  

## 2015-02-11 ENCOUNTER — Other Ambulatory Visit: Payer: Self-pay | Admitting: Internal Medicine

## 2015-02-24 ENCOUNTER — Ambulatory Visit (INDEPENDENT_AMBULATORY_CARE_PROVIDER_SITE_OTHER): Payer: BC Managed Care – PPO | Admitting: Internal Medicine

## 2015-02-24 ENCOUNTER — Encounter: Payer: Self-pay | Admitting: Internal Medicine

## 2015-02-24 VITALS — BP 138/90 | HR 87 | Temp 98.0°F | Wt 269.0 lb

## 2015-02-24 DIAGNOSIS — I10 Essential (primary) hypertension: Secondary | ICD-10-CM | POA: Diagnosis not present

## 2015-02-24 DIAGNOSIS — F900 Attention-deficit hyperactivity disorder, predominantly inattentive type: Secondary | ICD-10-CM

## 2015-02-24 DIAGNOSIS — F411 Generalized anxiety disorder: Secondary | ICD-10-CM

## 2015-02-24 NOTE — Assessment & Plan Note (Signed)
Ongoing symptoms despite the celexa Didn't think the buspar helped Interested in better control Discussed rescheduling counseling visit-- he was bumped due to overschedule and was upset and didn't get another appt Other alternative would be adding cymbalta

## 2015-02-24 NOTE — Assessment & Plan Note (Signed)
BP Readings from Last 3 Encounters:  02/24/15 138/90  11/23/14 146/86  09/20/14 124/84   Reasonable control Working on lifestyle

## 2015-02-24 NOTE — Progress Notes (Signed)
Subjective:    Patient ID: Mark Burns, male    DOB: [birth year], 34 y.o.   MRN: [id number]  HPI Here for follow up of ADHD and other conditions  Looking forward to the summer Will be home with kids (wife is a principal)  Occasionally takes BP Fine by school nurse--- 130's/80's Always white coat component No headaches, SOB  Still with anxiety  Still needs the adderall for focus at work Skips this on the weekends No improvement in anxiety--may actually be worse "trying to play catch up"  Current Outpatient Prescriptions on File Prior to Visit  Medication Sig Dispense Refill  . amphetamine-dextroamphetamine (ADDERALL XR) 20 MG 24 hr capsule Take 1 capsule (20 mg total) by mouth every morning. 30 capsule 0  . citalopram (CELEXA) 40 MG tablet TAKE 1 TABLET (40 MG TOTAL) BY MOUTH DAILY. 90 tablet 3  . hydrocortisone 2.5 % cream Apply topically 3 (three) times daily as needed. 453.6 g 1  . ketoconazole (NIZORAL) 2 % shampoo Apply 1 application topically 2 (two) times a week. 120 mL 5  . losartan-hydrochlorothiazide (HYZAAR) 100-25 MG per tablet TAKE 1 TABLET BY MOUTH DAILY. 90 tablet 3  . sildenafil (REVATIO) 20 MG tablet Take 3-5 tablets (60-100 mg total) by mouth daily as needed. 50 tablet 11  . tadalafil (CIALIS) 5 MG tablet Take 1 tablet (5 mg total) by mouth daily as needed for erectile dysfunction. 30 tablet 0  . traZODone (DESYREL) 50 MG tablet Take 1-3 tablets (50-150 mg total) by mouth at bedtime. 90 tablet 11  . valACYclovir (VALTREX) 1000 MG tablet Two tablets by mouth every 12 hours x two doses 4 tablet 3   No current facility-administered medications on file prior to visit.    Allergies  Allergen Reactions  . Sulfa Antibiotics Hives    Past Medical History  Diagnosis Date  . Generalized anxiety disorder   . Hypertension   . Erectile dysfunction   . Elevated LFTs 11/19/2013  . ADHD, predominantly inattentive type   . Recurrent cold sores   . Seborrheic dermatitis      Past Surgical History  Procedure Laterality Date  . Vasectomy  2014    No family history on file.  History   Social History  . Marital Status: Married    Spouse Name: N/A  . Number of Children: 2  . Years of Education: N/A   Occupational History  . PE teacher     [employer]   Social History Main Topics  . Smoking status: Never Smoker   . Smokeless tobacco: Never Used  . Alcohol Use: No     Comment: no alcohol in several months  . Drug Use: No  . Sexual Activity: Not on file   Other Topics Concern  . 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 No distress.  Neck: Normal range of motion. Neck supple. No thyromegaly present.  Cardiovascular: Normal rate, regular rhythm and normal heart sounds.  Exam reveals no gallop.   No murmur heard. Pulmonary/Chest: Effort normal and breath sounds normal. No respiratory distress. He has no wheezes. He has no rales.  Musculoskeletal: He exhibits no edema or tenderness.  Lymphadenopathy:    He has no cervical adenopathy.  Psychiatric: He has a normal  mood and affect. His behavior is normal.          Assessment & Plan:

## 2015-02-24 NOTE — Progress Notes (Signed)
Pre visit review using our clinic review tool, if applicable. No additional management support is needed unless otherwise documented below in the visit note. 

## 2015-02-24 NOTE — Assessment & Plan Note (Signed)
Really has this diagnosis and does well with the med Will continue

## 2015-02-25 ENCOUNTER — Telehealth: Payer: Self-pay

## 2015-02-25 NOTE — Telephone Encounter (Signed)
Pt left v/m wanting to know if had samples of ED drugs. Pt does have available refills already. Left detailed message(per DPR) [facility] no longer has samples and if anything further can help with please cb.

## 2015-03-09 ENCOUNTER — Other Ambulatory Visit: Payer: Self-pay

## 2015-03-09 DIAGNOSIS — F988 Other specified behavioral and emotional disorders with onset usually occurring in childhood and adolescence: Secondary | ICD-10-CM

## 2015-03-09 MED ORDER — AMPHETAMINE-DEXTROAMPHET ER 20 MG PO CP24: 20.0000 mg | ORAL_CAPSULE | ORAL | Status: DC

## 2015-03-09 NOTE — Telephone Encounter (Signed)
Pt left v/m requesting rx for Adderall. Call when ready for pick up. Pt seen 02/24/2015 and last printed 02/07/15.

## 2015-03-09 NOTE — Telephone Encounter (Signed)
Left message on machine that rx is ready for pick-up, and it will be at our front desk.  

## 2015-03-22 ENCOUNTER — Ambulatory Visit: Payer: Self-pay | Admitting: Psychology

## 2015-03-23 ENCOUNTER — Telehealth: Payer: Self-pay

## 2015-03-23 MED ORDER — DULOXETINE HCL 30 MG PO CPEP: 30.0000 mg | ORAL_CAPSULE | Freq: Every day | ORAL | Status: DC

## 2015-03-23 NOTE — Telephone Encounter (Signed)
Pt left v/m; pt seen 02/24/15 and pt wanted Dr Silvio Pate to know anxiety is no better and request different med for anxiety. Pt request cb. [facility]

## 2015-03-23 NOTE — Telephone Encounter (Signed)
Let him know I sent a prescription for the med we discussed at our visit Have him schedule a follow up in about 1 month to see how he is doing and to decide if we need to increase the dose

## 2015-03-23 NOTE — Telephone Encounter (Signed)
Spoke with patient and advised results, he will call for an appt in 1 mth

## 2015-03-28 NOTE — Telephone Encounter (Signed)
There is no magic here!! cymbalta is very effective for anxiety but he needs some patience. You have to start at a low dose and then move up. He should take it for 2 weeks and if he hasn't had a significant effect, I will increase the dose

## 2015-03-28 NOTE — Telephone Encounter (Signed)
Pt left v/m; the cymbalta is not effective for panic attacks. No changes in pts life so not sure why having panic attacks but pt request med that is faster acting. Pt request cb.

## 2015-03-29 NOTE — Telephone Encounter (Signed)
Spoke with patient and advised results, he will give the medication a chance and call back in 2 weeks.

## 2015-04-04 ENCOUNTER — Other Ambulatory Visit: Payer: Self-pay

## 2015-04-04 DIAGNOSIS — F988 Other specified behavioral and emotional disorders with onset usually occurring in childhood and adolescence: Secondary | ICD-10-CM

## 2015-04-04 MED ORDER — AMPHETAMINE-DEXTROAMPHET ER 20 MG PO CP24: 20.0000 mg | ORAL_CAPSULE | ORAL | Status: DC

## 2015-04-04 NOTE — Telephone Encounter (Signed)
Left message on machine that rx is ready for pick-up, and it will be at our front desk.  

## 2015-04-04 NOTE — Telephone Encounter (Signed)
Pt left v/m requesting rx for Adderall. Call when ready for pick up. Pt leaving for [place] on 04/08/15 and not to return until 04/24/15.Marland Kitchen Pt wants to pick up Aderall rx on 04/07/15. Last printed # 30 on 03/09/15. Last seen 02/24/15.Please advise. Pt also wanted Dr Silvio Pate to know that things are going great with medication. Pt states Dr Silvio Pate will understand.

## 2015-04-05 ENCOUNTER — Ambulatory Visit (INDEPENDENT_AMBULATORY_CARE_PROVIDER_SITE_OTHER): Payer: BC Managed Care – PPO | Admitting: Psychology

## 2015-04-05 DIAGNOSIS — F411 Generalized anxiety disorder: Secondary | ICD-10-CM

## 2015-05-03 ENCOUNTER — Ambulatory Visit: Payer: BC Managed Care – PPO | Admitting: Psychology

## 2015-05-16 ENCOUNTER — Other Ambulatory Visit: Payer: Self-pay

## 2015-05-16 DIAGNOSIS — F988 Other specified behavioral and emotional disorders with onset usually occurring in childhood and adolescence: Secondary | ICD-10-CM

## 2015-05-16 MED ORDER — AMPHETAMINE-DEXTROAMPHET ER 20 MG PO CP24: 20.0000 mg | ORAL_CAPSULE | ORAL | Status: DC

## 2015-05-16 NOTE — Telephone Encounter (Signed)
Pt left v/m requesting rx for Adderall. Call when ready for pick up. rx last printed # 30 on 04/04/15. Pt last seen 02/24/15.Please advise.

## 2015-05-16 NOTE — Telephone Encounter (Signed)
Spoke with patient and advised rx ready for pick-up and it will be at the front desk.  

## 2015-05-27 ENCOUNTER — Encounter: Payer: Self-pay | Admitting: Internal Medicine

## 2015-05-27 ENCOUNTER — Ambulatory Visit (INDEPENDENT_AMBULATORY_CARE_PROVIDER_SITE_OTHER): Payer: BC Managed Care – PPO | Admitting: Internal Medicine

## 2015-05-27 VITALS — BP 126/84 | HR 112 | Temp 98.4°F | Wt 266.0 lb

## 2015-05-27 DIAGNOSIS — S90812A Abrasion, left foot, initial encounter: Secondary | ICD-10-CM

## 2015-05-27 DIAGNOSIS — M79672 Pain in left foot: Secondary | ICD-10-CM

## 2015-05-27 DIAGNOSIS — M7989 Other specified soft tissue disorders: Secondary | ICD-10-CM | POA: Diagnosis not present

## 2015-05-27 MED ORDER — INDOMETHACIN 50 MG PO CAPS: 50.0000 mg | ORAL_CAPSULE | Freq: Three times a day (TID) | ORAL | Status: DC

## 2015-05-27 MED ORDER — CLINDAMYCIN HCL 300 MG PO CAPS: 300.0000 mg | ORAL_CAPSULE | Freq: Three times a day (TID) | ORAL | Status: DC

## 2015-05-27 NOTE — Patient Instructions (Signed)

## 2015-05-27 NOTE — Progress Notes (Signed)
Pre visit review using our clinic review tool, if applicable. No additional management support is needed unless otherwise documented below in the visit note. 

## 2015-05-27 NOTE — Progress Notes (Signed)
Subjective:    Patient ID: Mark Burns, male    DOB: 1980-12-04, 34 y.o.   MRN: [id number]  HPI  Pt presents to the clinic today with c/o an abrasion to the top of his left foot.They area started out as blisters on the top of his foot that busted. The area is warm to touch. He has not noticed any discharge from the area. He has washed it with soap and warm water and has been applying a topical antibiotic to it. He is concerned because he had a cut on his elbows years ago and became septic from it. He denies fever, chills or body aches. He has no history of diabetes or delayed wound healing. He does have a history of gout.  Review of Systems      Past Medical History  Diagnosis Date  . Generalized anxiety disorder   . Hypertension   . Erectile dysfunction   . Elevated LFTs 11/19/2013  . ADHD, predominantly inattentive type   . Recurrent cold sores   . Seborrheic dermatitis     Current Outpatient Prescriptions  Medication Sig Dispense Refill  . amphetamine-dextroamphetamine (ADDERALL XR) 20 MG 24 hr capsule Take 1 capsule (20 mg total) by mouth every morning. 30 capsule 0  . citalopram (CELEXA) 40 MG tablet TAKE 1 TABLET (40 MG TOTAL) BY MOUTH DAILY. 90 tablet 3  . hydrocortisone 2.5 % cream Apply topically 3 (three) times daily as needed. 453.6 g 1  . ketoconazole (NIZORAL) 2 % shampoo Apply 1 application topically 2 (two) times a week. 120 mL 5  . losartan-hydrochlorothiazide (HYZAAR) 100-25 MG per tablet TAKE 1 TABLET BY MOUTH DAILY. 90 tablet 3  . sildenafil (REVATIO) 20 MG tablet Take 3-5 tablets (60-100 mg total) by mouth daily as needed. 50 tablet 11  . tadalafil (CIALIS) 5 MG tablet Take 1 tablet (5 mg total) by mouth daily as needed for erectile dysfunction. 30 tablet 0  . valACYclovir (VALTREX) 1000 MG tablet Two tablets by mouth every 12 hours x two doses 4 tablet 3   No current facility-administered medications for this visit.    Allergies  Allergen Reactions  . Sulfa  Antibiotics Hives    History reviewed. No pertinent family history.  Social History   Social History  . Marital Status: Married    Spouse Name: N/A  . Number of Children: 2  . Years of Education: N/A   Occupational History  . PE teacher     [employer]   Social History Main Topics  . Smoking status: Never Smoker   . Smokeless tobacco: Never Used  . Alcohol Use: No     Comment: no alcohol in several months  . Drug Use: No  . Sexual Activity: Not on file   Other Topics Concern  . Not on file   Social History Narrative         Constitutional: Denies fever, malaise, fatigue, headache or abrupt weight changes.  Respiratory: Denies difficulty breathing, shortness of breath, cough or sputum production.   Cardiovascular: Denies chest pain, chest tightness, palpitations or swelling in the hands or feet.  Musculoskeletal: Pt reports decrease in ROM. Denies difficulty with gait, muscle pain or joint pain and swelling.  Skin: Pt reports redness to top of left foot. Denies rashes, lesions or ulcercations.   No other specific complaints in a complete review of systems (except as listed in HPI above).  Objective:   Physical Exam   BP 126/84 mmHg  Pulse  112  Temp(Src) 98.4 F (36.9 C) (Oral)  Wt 266 lb (120.657 kg)  SpO2 98% Wt Readings from Last 3 Encounters:  05/27/15 266 lb (120.657 kg)  02/24/15 269 lb (122.018 kg)  11/23/14 281 lb (127.461 kg)    General: Appears his stated age, in NAD. Skin: Warmth and redness noted of left dorsal/lateral portion of foot. Abrasion in the area is scabbed over, does not look infected. Cardiovascular: Normal rate and rhythm. S1,S2 noted.  No murmur, rubs or gallops noted.  Pulmonary/Chest: Normal effort and positive vesicular breath sounds. No respiratory distress. No wheezes, rales or ronchi noted.  Musculoskeletal: Decreased rotation of the left ankle. Normal flexion and extension. No difficulty with gait. Neurological:  Alert and oriented. Sensation intact to BLE.  BMET    Component Value Date/Time   NA 136 11/23/2014 1237   NA 136 10/25/2013 0503   K 4.0 11/23/2014 1237   K 3.5 10/25/2013 0503   CL 101 11/23/2014 1237   CL 102 10/25/2013 0503   CO2 26 11/23/2014 1237   CO2 31 10/25/2013 0503   GLUCOSE 92 11/23/2014 1237   GLUCOSE 111* 10/25/2013 0503   BUN 25* 11/23/2014 1237   BUN 21* 10/25/2013 0503   CREATININE 0.96 11/23/2014 1237   CREATININE 1.11 10/25/2013 0503   CREATININE 0.94 08/06/2013 1455   CALCIUM 9.9 11/23/2014 1237   CALCIUM 9.0 10/25/2013 0503   GFRNONAA >60 10/25/2013 0503   GFRNONAA >89 08/06/2013 1455   GFRAA >60 10/25/2013 0503   GFRAA >89 08/06/2013 1455    Lipid Panel     Component Value Date/Time   CHOL 281* 11/23/2014 1237   TRIG * 11/23/2014 1237    575.0 Triglyceride is over 400; calculations on Lipids are invalid.   HDL 48.30 11/23/2014 1237   CHOLHDL 6 11/23/2014 1237    CBC    Component Value Date/Time   WBC 9.6 11/23/2014 1237   WBC 8.4 10/25/2013 0503   RBC 4.64 11/23/2014 1237   RBC 3.87* 10/25/2013 0503   HGB 14.3 11/23/2014 1237   HGB 12.0* 10/25/2013 0503   HCT 41.1 11/23/2014 1237   HCT 33.9* 10/25/2013 0503   PLT 256.0 11/23/2014 1237   PLT 254 10/25/2013 0503   MCV 88.6 11/23/2014 1237   MCV 88 10/25/2013 0503   MCH 31.0 10/25/2013 0503   MCHC 34.8 11/23/2014 1237   MCHC 35.5 10/25/2013 0503   RDW 12.5 11/23/2014 1237   RDW 12.5 10/25/2013 0503   LYMPHSABS 3.3 11/23/2014 1237   LYMPHSABS 2.4 10/25/2013 0503   MONOABS 0.6 11/23/2014 1237   MONOABS 0.9 10/25/2013 0503   EOSABS 0.2 11/23/2014 1237   EOSABS 0.2 10/25/2013 0503   BASOSABS 0.0 11/23/2014 1237   BASOSABS 0.1 10/25/2013 0503    Hgb A1C No results found for: HGBA1C      Assessment & Plan:   Redness/warmth of of left foot:  Gout versus mild cellulitis Will treat with Clindamycin 300 mg TID x 10 days Indomethacin 50 mg TID with meals x 3 days, longer if  needed Watch for increased pain, redness, streaking, fever or chills  RTC as needed or if symptoms persist or worsen

## 2015-06-10 ENCOUNTER — Telehealth: Payer: Self-pay | Admitting: Internal Medicine

## 2015-06-10 NOTE — Telephone Encounter (Signed)
Not sure what to do with the letter, will have it scanned into pt's chart, we can't do prior auth until the medication is ran thru the pharmacy. Pt got refill 05/16/2015. Form on your desk

## 2015-06-10 NOTE — Telephone Encounter (Signed)
Pt dropped off letter from insurance regarding dextroamphetamine medication.  Placing on Mark Burns's desk. Call [phone] with questions.

## 2015-06-13 NOTE — Telephone Encounter (Signed)
Why don't you call and see? This is a refill so I may need to just recertify his need

## 2015-06-14 NOTE — Telephone Encounter (Signed)
I can not do a prior auth until the insurance kicks it back stating that it needs prior auth.

## 2015-06-17 ENCOUNTER — Other Ambulatory Visit: Payer: Self-pay | Admitting: *Deleted

## 2015-06-17 DIAGNOSIS — F988 Other specified behavioral and emotional disorders with onset usually occurring in childhood and adolescence: Secondary | ICD-10-CM

## 2015-06-17 MED ORDER — AMPHETAMINE-DEXTROAMPHET ER 20 MG PO CP24: 20.0000 mg | ORAL_CAPSULE | ORAL | Status: DC

## 2015-06-17 NOTE — Telephone Encounter (Signed)
Printed. Thanks. Need 1+ day on this rx.

## 2015-06-17 NOTE — Telephone Encounter (Signed)
Left message on machine that rx is ready for pick-up, and it will be at our front desk.  

## 2015-06-17 NOTE — Telephone Encounter (Signed)
Pt left message with triage. Pt is going out of town and would like his Rx ready by 12:00pm. I advise pt that Dr. Silvio Pate isn't in the office today and I have to send the request to another provider and they are seeing pt's so not sure if Rx will be ready by 12:00pm but we will call him once it's ready

## 2015-06-21 NOTE — Telephone Encounter (Signed)
Pt left v/m requesting cb about paperwork left on 06/10/15.

## 2015-06-22 NOTE — Telephone Encounter (Signed)
Pt left v/m requesting cb about status of Adderall PA today.

## 2015-06-23 NOTE — Telephone Encounter (Signed)
After speaking with patient again, and getting the correct insurance info, I have manually faxed form to [facility] today. I tried doing prior auth in cover my meds but that showed the pt as being inactive. I informed pt that I am working on the prior auth and will let him know ASAP what happens.

## 2015-06-23 NOTE — Telephone Encounter (Signed)
Pt called in to inquire about the PA.  He stated that the insurance co has faxed over another PA, I have not seen it on the fax yet.  Pt requesting cb when it is complete. CB number is [phone]  Thanks

## 2015-07-20 ENCOUNTER — Other Ambulatory Visit: Payer: Self-pay | Admitting: Internal Medicine

## 2015-07-20 NOTE — Telephone Encounter (Signed)
Pt left v/m pt was seen 05/27/15 and was given med for gout; pt cannot remember the name of med and request [facility]ill to [facility].Please advise.

## 2015-07-22 ENCOUNTER — Other Ambulatory Visit: Payer: Self-pay

## 2015-07-22 DIAGNOSIS — F988 Other specified behavioral and emotional disorders with onset usually occurring in childhood and adolescence: Secondary | ICD-10-CM

## 2015-07-22 NOTE — Telephone Encounter (Signed)
Rx sent through [facility]  

## 2015-07-22 NOTE — Telephone Encounter (Signed)
Pt left v/m requesting rx for Adderall. Call when ready for pick up. rx last printed # 30 on 06/17/15. Last seen 02/24/15.

## 2015-07-23 NOTE — Telephone Encounter (Signed)
Approved: okay to prepare Rx for me to sign 

## 2015-07-25 MED ORDER — AMPHETAMINE-DEXTROAMPHET ER 20 MG PO CP24: 20.0000 mg | ORAL_CAPSULE | ORAL | Status: DC

## 2015-07-25 NOTE — Telephone Encounter (Signed)
Left message on machine that rx is ready for pick-up, and it will be at our front desk.  

## 2015-08-24 ENCOUNTER — Other Ambulatory Visit: Payer: Self-pay | Admitting: *Deleted

## 2015-08-24 DIAGNOSIS — F988 Other specified behavioral and emotional disorders with onset usually occurring in childhood and adolescence: Secondary | ICD-10-CM

## 2015-08-24 NOTE — Telephone Encounter (Signed)
Last filled #30 07/25/15

## 2015-08-25 ENCOUNTER — Encounter: Payer: Self-pay | Admitting: Internal Medicine

## 2015-08-25 MED ORDER — AMPHETAMINE-DEXTROAMPHET ER 20 MG PO CP24: 20.0000 mg | ORAL_CAPSULE | ORAL | Status: DC

## 2015-08-25 NOTE — Telephone Encounter (Signed)
Spoke with patient and advised rx ready for pick-up and it will be at the front desk.  

## 2015-08-29 ENCOUNTER — Ambulatory Visit (INDEPENDENT_AMBULATORY_CARE_PROVIDER_SITE_OTHER): Payer: BC Managed Care – PPO | Admitting: Internal Medicine

## 2015-08-29 ENCOUNTER — Encounter: Payer: Self-pay | Admitting: Internal Medicine

## 2015-08-29 VITALS — BP 140/90 | HR 107 | Temp 98.1°F | Wt 266.0 lb

## 2015-08-29 DIAGNOSIS — F411 Generalized anxiety disorder: Secondary | ICD-10-CM | POA: Diagnosis not present

## 2015-08-29 DIAGNOSIS — F900 Attention-deficit hyperactivity disorder, predominantly inattentive type: Secondary | ICD-10-CM | POA: Diagnosis not present

## 2015-08-29 MED ORDER — LORAZEPAM 0.5 MG PO TABS: 0.5000 mg | ORAL_TABLET | Freq: Every day | ORAL | Status: DC | PRN

## 2015-08-29 NOTE — Assessment & Plan Note (Signed)
Still benefits from the adderall for work

## 2015-08-29 NOTE — Progress Notes (Signed)
Pre visit review using our clinic review tool, if applicable. No additional management support is needed unless otherwise documented below in the visit note. 

## 2015-08-29 NOTE — Assessment & Plan Note (Signed)
Didn't click with Dr Rexene Edison for counseling Duloxetine helped--but he didn't like side effects Needs something for when he comes home--will Rx for 1 lorazepam nightly at most

## 2015-08-29 NOTE — Progress Notes (Signed)
   Subjective:    Patient ID: Mark Burns, male    DOB: [birth year], 34 y.o.   MRN: [id number]  HPI Here for follow up of anxiety and ADHD  Doing well Still with anxiety Duloxetine helped but really messed with his sex drive etc Stopped it after a month Still on the citalopram Will get panic attacks at times--may occur in meetings, etc Has to "talk myself out of it"  Only uses the adderall for work Still feels this helps him focus, etc Some panic on days off also 1-2 beers occasionally will settle him also  BP okay as far as he knows Seems to retain fluid at times--despite the HCTZ  Current Outpatient Prescriptions on File Prior to Visit  Medication Sig Dispense Refill  . amphetamine-dextroamphetamine (ADDERALL XR) 20 MG 24 hr capsule Take 1 capsule (20 mg total) by mouth every morning. 30 capsule 0  . citalopram (CELEXA) 40 MG tablet TAKE 1 TABLET (40 MG TOTAL) BY MOUTH DAILY. 90 tablet 3  . hydrocortisone 2.5 % cream Apply topically 3 (three) times daily as needed. 453.6 g 1  . indomethacin (INDOCIN) 50 MG capsule TAKE 1 CAPSULE (50 MG TOTAL) BY MOUTH 3 (THREE) TIMES DAILY WITH MEALS. 30 capsule 0  . ketoconazole (NIZORAL) 2 % shampoo Apply 1 application topically 2 (two) times a week. 120 mL 5  . losartan-hydrochlorothiazide (HYZAAR) 100-25 MG per tablet TAKE 1 TABLET BY MOUTH DAILY. 90 tablet 3  . sildenafil (REVATIO) 20 MG tablet Take 3-5 tablets (60-100 mg total) by mouth daily as needed. 50 tablet 11  . tadalafil (CIALIS) 5 MG tablet Take 1 tablet (5 mg total) by mouth daily as needed for erectile dysfunction. 30 tablet 0  . valACYclovir (VALTREX) 1000 MG tablet Two tablets by mouth every 12 hours x two doses 4 tablet 3   No current facility-administered medications on file prior to visit.    Allergies  Allergen Reactions  . Sulfa Antibiotics Hives    Past Medical History  Diagnosis Date  . Generalized anxiety disorder   . Hypertension   . Erectile dysfunction   .  Elevated LFTs 11/19/2013  . ADHD, predominantly inattentive type   . Recurrent cold sores   . Seborrheic dermatitis     Past Surgical History  Procedure Laterality Date  . Vasectomy  2014    No family history on file.  Social History   Social History  . Marital Status: Married    Spouse Name: N/A  . Number of Children: 2  . Years of Education: N/A   Occupational History  . PE teacher     [employer]   Social History Main Topics  . Smoking status: Never Smoker   . Smokeless tobacco: Never Used  . Alcohol Use: No     Comment: no alcohol in several months  . Drug Use: No  . Sexual Activity: Not on file   Other Topics Concern  . Not on file   Social History Narrative       Review of Systems Does regular exercise--but not like before Not sleeping great--will use the trazodone occasionally (but gives weird dreams) He plans to try tylenol PM    Objective:   Physical Exam  Psychiatric:  Mild anxiety Normal appearance and speech          Assessment & Plan:

## 2015-09-06 ENCOUNTER — Encounter: Payer: Self-pay | Admitting: Internal Medicine

## 2015-09-16 ENCOUNTER — Encounter: Payer: Self-pay | Admitting: Internal Medicine

## 2015-09-26 ENCOUNTER — Encounter: Payer: Self-pay | Admitting: Internal Medicine

## 2015-09-26 DIAGNOSIS — F988 Other specified behavioral and emotional disorders with onset usually occurring in childhood and adolescence: Secondary | ICD-10-CM

## 2015-09-26 MED ORDER — AMPHETAMINE-DEXTROAMPHET ER 20 MG PO CP24: 20.0000 mg | ORAL_CAPSULE | ORAL | Status: DC

## 2015-09-26 NOTE — Telephone Encounter (Signed)
08/25/15 

## 2015-09-26 NOTE — Telephone Encounter (Signed)
Sent patient message back thru my-chart, that prescription is ready for pick-up and will be at the front desk.  

## 2015-10-10 ENCOUNTER — Encounter: Payer: Self-pay | Admitting: Internal Medicine

## 2015-10-11 MED ORDER — LORAZEPAM 0.5 MG PO TABS: 0.5000 mg | ORAL_TABLET | Freq: Every day | ORAL | Status: DC | PRN

## 2015-10-11 NOTE — Telephone Encounter (Signed)
08/29/2015 

## 2015-10-11 NOTE — Telephone Encounter (Signed)
Okay to refill #30 x 0 

## 2015-10-11 NOTE — Telephone Encounter (Signed)
rx called into pharmacy

## 2015-11-01 ENCOUNTER — Other Ambulatory Visit: Payer: Self-pay | Admitting: Internal Medicine

## 2015-11-01 DIAGNOSIS — F988 Other specified behavioral and emotional disorders with onset usually occurring in childhood and adolescence: Secondary | ICD-10-CM

## 2015-11-01 MED ORDER — AMPHETAMINE-DEXTROAMPHET ER 20 MG PO CP24: 20.0000 mg | ORAL_CAPSULE | ORAL | Status: DC

## 2015-11-01 NOTE — Telephone Encounter (Signed)
09/26/2015 

## 2015-11-08 ENCOUNTER — Encounter: Payer: Self-pay | Admitting: Internal Medicine

## 2015-11-08 MED ORDER — LORAZEPAM 0.5 MG PO TABS: 0.5000 mg | ORAL_TABLET | Freq: Every evening | ORAL | Status: DC | PRN

## 2015-11-08 NOTE — Telephone Encounter (Signed)
Approved: 30 x 0 

## 2015-11-08 NOTE — Telephone Encounter (Signed)
Message from patient:  Good morning, Dr. Silvio Pate. I hope all is well.  I am requesting the refill of Lorazapam.  Can I go up to 1.0 mg at night?

## 2015-11-08 NOTE — Telephone Encounter (Signed)
10/11/2015 

## 2015-11-08 NOTE — Telephone Encounter (Signed)
Sent patient message back thru my-chart, advised to call or resend message if pt has any questions. rx called into pharmacy

## 2015-11-08 NOTE — Telephone Encounter (Signed)
Approved: okay to change to 1-2 at bedtime prn #60 x 0 Tell him it would be best if he didn't take the 2 every night--only if he has had trouble over a few nights with just the 1. We may need to adjust his stimulant medications if he needs more sleep med (as this could be causing the problem)

## 2015-11-20 ENCOUNTER — Other Ambulatory Visit: Payer: Self-pay | Admitting: Physician Assistant

## 2015-11-21 NOTE — Telephone Encounter (Signed)
Refill refused.   Not a [facility] patient.

## 2015-12-05 ENCOUNTER — Other Ambulatory Visit: Payer: Self-pay | Admitting: Internal Medicine

## 2015-12-05 ENCOUNTER — Encounter: Payer: Self-pay | Admitting: Internal Medicine

## 2015-12-05 DIAGNOSIS — F988 Other specified behavioral and emotional disorders with onset usually occurring in childhood and adolescence: Secondary | ICD-10-CM

## 2015-12-05 MED ORDER — AMPHETAMINE-DEXTROAMPHET ER 20 MG PO CP24: 20.0000 mg | ORAL_CAPSULE | ORAL | Status: DC

## 2015-12-05 NOTE — Telephone Encounter (Signed)
Sent patient message back thru my-chart, that prescription is ready for pick-up and will be at the front desk.  

## 2015-12-05 NOTE — Telephone Encounter (Signed)
11/01/2015 

## 2015-12-07 ENCOUNTER — Other Ambulatory Visit: Payer: Self-pay | Admitting: Internal Medicine

## 2015-12-07 MED ORDER — LORAZEPAM 0.5 MG PO TABS: 0.5000 mg | ORAL_TABLET | Freq: Every evening | ORAL | Status: DC | PRN

## 2015-12-07 NOTE — Telephone Encounter (Signed)
Approved: #60 x 0 

## 2015-12-07 NOTE — Telephone Encounter (Signed)
11/08/15 

## 2015-12-07 NOTE — Telephone Encounter (Signed)
rx called into pharmacy

## 2015-12-16 ENCOUNTER — Other Ambulatory Visit: Payer: Self-pay | Admitting: Physician Assistant

## 2015-12-16 ENCOUNTER — Other Ambulatory Visit: Payer: Self-pay | Admitting: Internal Medicine

## 2015-12-16 NOTE — Telephone Encounter (Signed)
Rx sent electronically.  

## 2016-01-03 ENCOUNTER — Other Ambulatory Visit: Payer: Self-pay | Admitting: Internal Medicine

## 2016-01-03 DIAGNOSIS — F988 Other specified behavioral and emotional disorders with onset usually occurring in childhood and adolescence: Secondary | ICD-10-CM

## 2016-01-04 MED ORDER — LORAZEPAM 0.5 MG PO TABS: 0.5000 mg | ORAL_TABLET | Freq: Every evening | ORAL | Status: DC | PRN

## 2016-01-04 MED ORDER — AMPHETAMINE-DEXTROAMPHET ER 20 MG PO CP24: 20.0000 mg | ORAL_CAPSULE | ORAL | Status: DC

## 2016-01-04 NOTE — Addendum Note (Signed)
Addended by: Viviana Simpler I on: 01/04/2016 07:56 AM   Modules accepted: Orders

## 2016-01-04 NOTE — Telephone Encounter (Signed)
Called in lorazepam. Adderall rx up front to pick up. Message sent via MyChart to patient

## 2016-01-04 NOTE — Telephone Encounter (Signed)
Phone in the lorazepam please

## 2016-01-07 ENCOUNTER — Other Ambulatory Visit: Payer: Self-pay | Admitting: Internal Medicine

## 2016-01-26 ENCOUNTER — Other Ambulatory Visit: Payer: Self-pay | Admitting: Internal Medicine

## 2016-01-26 MED ORDER — CITALOPRAM HYDROBROMIDE 40 MG PO TABS: 40.0000 mg | ORAL_TABLET | Freq: Every day | ORAL | Status: DC

## 2016-01-26 MED ORDER — SILDENAFIL CITRATE 20 MG PO TABS: ORAL_TABLET | ORAL | Status: DC

## 2016-01-26 NOTE — Telephone Encounter (Signed)
Rxs sent electronically.  

## 2016-02-01 ENCOUNTER — Other Ambulatory Visit: Payer: Self-pay | Admitting: Internal Medicine

## 2016-02-01 DIAGNOSIS — F988 Other specified behavioral and emotional disorders with onset usually occurring in childhood and adolescence: Secondary | ICD-10-CM

## 2016-02-01 MED ORDER — AMPHETAMINE-DEXTROAMPHET ER 20 MG PO CP24: 20.0000 mg | ORAL_CAPSULE | ORAL | Status: DC

## 2016-02-01 NOTE — Telephone Encounter (Signed)
Rx up front ready for pickup. Patient aware through [facility]

## 2016-02-01 NOTE — Telephone Encounter (Signed)
Last filled 01-04-16. Last OV 08-29-15 Next OV 03-19-16

## 2016-02-20 ENCOUNTER — Other Ambulatory Visit: Payer: Self-pay | Admitting: Internal Medicine

## 2016-02-20 MED ORDER — LORAZEPAM 0.5 MG PO TABS: 0.5000 mg | ORAL_TABLET | Freq: Every evening | ORAL | Status: DC | PRN

## 2016-02-20 NOTE — Telephone Encounter (Signed)
Approved: #60 x 0 

## 2016-02-20 NOTE — Telephone Encounter (Signed)
Last filled 01-04-16 #60 Last OV 08-29-15 Next OV 03-19-16

## 2016-02-20 NOTE — Telephone Encounter (Signed)
Left refill on voice mail at pharmacy  

## 2016-03-05 ENCOUNTER — Other Ambulatory Visit: Payer: Self-pay | Admitting: Internal Medicine

## 2016-03-05 DIAGNOSIS — F988 Other specified behavioral and emotional disorders with onset usually occurring in childhood and adolescence: Secondary | ICD-10-CM

## 2016-03-05 MED ORDER — AMPHETAMINE-DEXTROAMPHET ER 20 MG PO CP24: 20.0000 mg | ORAL_CAPSULE | ORAL | Status: DC

## 2016-03-05 NOTE — Telephone Encounter (Signed)
MyChart message sent to pt that rx was up front ready for pickup 

## 2016-03-05 NOTE — Telephone Encounter (Signed)
Last written 02-01-16 #30. Last OV 08-29-15 Next OV 03-19-16

## 2016-03-05 NOTE — Addendum Note (Signed)
Addended by: Viviana Simpler I on: 03/05/2016 01:23 PM   Modules accepted: Orders

## 2016-03-06 ENCOUNTER — Other Ambulatory Visit: Payer: Self-pay | Admitting: Internal Medicine

## 2016-03-06 DIAGNOSIS — B009 Herpesviral infection, unspecified: Secondary | ICD-10-CM

## 2016-03-06 MED ORDER — VALACYCLOVIR HCL 1 G PO TABS: ORAL_TABLET | ORAL | Status: DC

## 2016-03-06 MED ORDER — INDOMETHACIN 50 MG PO CAPS: 50.0000 mg | ORAL_CAPSULE | Freq: Three times a day (TID) | ORAL | Status: DC | PRN

## 2016-03-06 NOTE — Telephone Encounter (Signed)
Rx sent electronically.Mark Burns MESSAGE SENT TO PT

## 2016-03-11 ENCOUNTER — Other Ambulatory Visit: Payer: Self-pay | Admitting: Internal Medicine

## 2016-03-14 ENCOUNTER — Encounter: Payer: Self-pay | Admitting: Internal Medicine

## 2016-03-15 NOTE — Telephone Encounter (Signed)
See if he wants to come in tomorrow

## 2016-03-16 ENCOUNTER — Ambulatory Visit: Payer: Self-pay | Admitting: Internal Medicine

## 2016-03-16 DIAGNOSIS — Z0289 Encounter for other administrative examinations: Secondary | ICD-10-CM

## 2016-03-19 ENCOUNTER — Encounter: Payer: Self-pay | Admitting: Internal Medicine

## 2016-03-19 ENCOUNTER — Ambulatory Visit (INDEPENDENT_AMBULATORY_CARE_PROVIDER_SITE_OTHER): Payer: BC Managed Care – PPO | Admitting: Internal Medicine

## 2016-03-19 VITALS — BP 138/86 | HR 83 | Temp 98.3°F | Ht 73.75 in | Wt 260.0 lb

## 2016-03-19 DIAGNOSIS — G8929 Other chronic pain: Secondary | ICD-10-CM

## 2016-03-19 DIAGNOSIS — M549 Dorsalgia, unspecified: Secondary | ICD-10-CM

## 2016-03-19 DIAGNOSIS — I10 Essential (primary) hypertension: Secondary | ICD-10-CM | POA: Diagnosis not present

## 2016-03-19 DIAGNOSIS — F900 Attention-deficit hyperactivity disorder, predominantly inattentive type: Secondary | ICD-10-CM | POA: Diagnosis not present

## 2016-03-19 DIAGNOSIS — F411 Generalized anxiety disorder: Secondary | ICD-10-CM

## 2016-03-19 DIAGNOSIS — Z0000 Encounter for general adult medical examination without abnormal findings: Secondary | ICD-10-CM | POA: Diagnosis not present

## 2016-03-19 MED ORDER — HYDROCODONE-ACETAMINOPHEN 5-325 MG PO TABS: 1.0000 | ORAL_TABLET | Freq: Two times a day (BID) | ORAL | Status: DC | PRN

## 2016-03-19 NOTE — Assessment & Plan Note (Signed)
I will take over the meds Decrease the hydrocodone dose No tramadol due to SSRI CC: Dr Nelva Bush

## 2016-03-19 NOTE — Assessment & Plan Note (Signed)
BP Readings from Last 3 Encounters:  03/19/16 138/86  08/29/15 140/90  05/27/15 126/84   Good control  No changes needed

## 2016-03-19 NOTE — Assessment & Plan Note (Signed)
Does okay on the medication

## 2016-03-19 NOTE — Progress Notes (Signed)
Pre visit review using our clinic review tool, if applicable. No additional management support is needed unless otherwise documented below in the visit note. 

## 2016-03-19 NOTE — Assessment & Plan Note (Signed)
UTD on tetanus No cancer screening yet Gets yearly flu shot

## 2016-03-19 NOTE — Addendum Note (Signed)
Addended by: Marchia Bond on: 03/19/2016 04:17 PM   Modules accepted: Miquel Dunn

## 2016-03-19 NOTE — Assessment & Plan Note (Signed)
Has stopped the lorazepam Still on citalopram

## 2016-03-19 NOTE — Progress Notes (Signed)
Subjective:    Patient ID: Mark Burns, male    DOB: 06/26/1981, 35 y.o.   MRN: [id number]  HPI Here for physical  He has several concerns Has apparent skin tags at anus--they are inside Has always bled with wiping --on toilet paper No blood in stool Fairly regular--not hard  Has rash on calves Was cutting grass ---tall Has classic appearance of chiggers--discussed self limited nature of this Can use OTC cortisone cream  Trouble with the copayments for ortho On hydrocodone--gets a bit of "feeling" from it. Uses this one to 2 daily He feels he did well with tramadol in past and would prefer this Sees Dr Margarita Grizzle and Nelva Bush Prefers that I handle this Reviewed State website for controlled substances--has my prescriptions and monthly from [place] and group  Tries to work out regularly  Current Outpatient Prescriptions on File Prior to Visit  Medication Sig Dispense Refill  . amphetamine-dextroamphetamine (ADDERALL XR) 20 MG 24 hr capsule Take 1 capsule (20 mg total) by mouth every morning. 30 capsule 0  . citalopram (CELEXA) 40 MG tablet Take 1 tablet (40 mg total) by mouth daily. 90 tablet 0  . hydrocortisone 2.5 % cream Apply topically 3 (three) times daily as needed. 453.6 g 1  . indomethacin (INDOCIN) 50 MG capsule Take 1 capsule (50 mg total) by mouth 3 (three) times daily as needed. 30 capsule 0  . ketoconazole (NIZORAL) 2 % shampoo Apply 1 application topically 2 (two) times a week. 120 mL 5  . losartan-hydrochlorothiazide (HYZAAR) 100-25 MG tablet TAKE 1 TABLET BY MOUTH DAILY. 90 tablet 0  . sildenafil (REVATIO) 20 MG tablet TAKE 3-5 TABLETS BY MOUTH DAILY AS NEEDED 50 tablet 0  . valACYclovir (VALTREX) 1000 MG tablet Two tablets by mouth every 12 hours x two doses 4 tablet 3   No current facility-administered medications on file prior to visit.    Allergies  Allergen Reactions  . Sulfa Antibiotics Hives    Past Medical History  Diagnosis Date  . Generalized anxiety  disorder   . Hypertension   . Erectile dysfunction   . Elevated LFTs 11/19/2013  . ADHD, predominantly inattentive type   . Recurrent cold sores   . Seborrheic dermatitis     Past Surgical History  Procedure Laterality Date  . Vasectomy  2014    No family history on file.  Social History   Social History  . Marital Status: Married    Spouse Name: N/A  . Number of Children: 2  . Years of Education: N/A   Occupational History  . PE teacher     [employer]   Social History Main Topics  . Smoking status: Never Smoker   . Smokeless tobacco: Never Used  . Alcohol Use: No     Comment: no alcohol in several months  . Drug Use: No  . Sexual Activity: Not on file   Other Topics Concern  . Not on file   Social History Narrative       Review of Systems  Constitutional: Negative for fatigue and unexpected weight change.       Wears seat belt  HENT: Negative for dental problem, hearing loss and tinnitus.        Keeps up with dentist  Eyes: Negative for visual disturbance.       No diplopia or unilateral vision loss  Respiratory: Negative for cough, chest tightness and shortness of breath.   Cardiovascular: Negative for chest pain and leg swelling.  Palpitations with the panic  Gastrointestinal: Positive for anal bleeding. Negative for nausea, vomiting, abdominal pain, constipation and blood in stool.  Endocrine: Negative for polydipsia and polyuria.  Genitourinary: Negative for urgency and difficulty urinating.       Still uses the sildenafil---performance anxeity  Musculoskeletal: Positive for back pain and arthralgias. Negative for joint swelling.  Skin:       No suspicious lesions Rash near buttock  Allergic/Immunologic: Positive for environmental allergies. Negative for immunocompromised state.       Uses sudafed at times  Neurological: Negative for dizziness, syncope, weakness, light-headedness and headaches.  Hematological: Negative for  adenopathy. Does not bruise/bleed easily.  Psychiatric/Behavioral: Positive for sleep disturbance. Negative for dysphoric mood. The patient is nervous/anxious.        Objective:   Physical Exam  Constitutional: He is oriented to person, place, and time. He appears well-developed and well-nourished. No distress.  HENT:  Head: Normocephalic and atraumatic.  Right Ear: External ear normal.  Left Ear: External ear normal.  Mouth/Throat: Oropharynx is clear and moist. No oropharyngeal exudate.  Eyes: Conjunctivae are normal. Pupils are equal, round, and reactive to light.  Neck: Normal range of motion. Neck supple. No thyromegaly present.  Cardiovascular: Normal rate, regular rhythm, normal heart sounds and intact distal pulses.  Exam reveals no gallop.   No murmur heard. Pulmonary/Chest: Effort normal and breath sounds normal. No respiratory distress. He has no wheezes. He has no rales.  Abdominal: Soft. There is no tenderness.  Genitourinary:  Scaly red/purplish rash over sacrum Small internal hemorrhoids --no polyps  Musculoskeletal: Normal range of motion. He exhibits no edema.  Lymphadenopathy:    He has no cervical adenopathy.  Neurological: He is alert and oriented to person, place, and time.  Skin: No erythema.  Psychiatric: He has a normal mood and affect. His behavior is normal.          Assessment & Plan:

## 2016-03-20 ENCOUNTER — Encounter: Payer: Self-pay | Admitting: Internal Medicine

## 2016-03-20 LAB — CBC WITH DIFFERENTIAL/PLATELET
BASOS ABS: 0 10*3/uL (ref 0.0–0.1)
Basophils Relative: 0.1 % (ref 0.0–3.0)
EOS ABS: 0.2 10*3/uL (ref 0.0–0.7)
Eosinophils Relative: 1.5 % (ref 0.0–5.0)
HEMATOCRIT: 39.4 % (ref 39.0–52.0)
HEMOGLOBIN: 13.3 g/dL (ref 13.0–17.0)
LYMPHS PCT: 22.2 % (ref 12.0–46.0)
Lymphs Abs: 2.2 10*3/uL (ref 0.7–4.0)
MCHC: 33.7 g/dL (ref 30.0–36.0)
MCV: 88.9 fl (ref 78.0–100.0)
Monocytes Absolute: 1.7 10*3/uL — ABNORMAL HIGH (ref 0.1–1.0)
Monocytes Relative: 16.7 % — ABNORMAL HIGH (ref 3.0–12.0)
Neutro Abs: 5.9 10*3/uL (ref 1.4–7.7)
Neutrophils Relative %: 59.5 % (ref 43.0–77.0)
Platelets: 268 10*3/uL (ref 150.0–400.0)
RBC: 4.43 Mil/uL (ref 4.22–5.81)
RDW: 12.6 % (ref 11.5–15.5)
WBC: 9.9 10*3/uL (ref 4.0–10.5)

## 2016-03-20 LAB — LIPID PANEL
CHOLESTEROL: 254 mg/dL — AB (ref 0–200)
HDL: 55.9 mg/dL (ref >39.00)
LDL CALC: 167 mg/dL — AB (ref 0–99)
NonHDL: 197.87
TRIGLYCERIDES: 153 mg/dL — AB (ref 0.0–149.0)
Total CHOL/HDL Ratio: 5
VLDL: 30.6 mg/dL (ref 0.0–40.0)

## 2016-03-20 LAB — COMPREHENSIVE METABOLIC PANEL
ALK PHOS: 53 U/L (ref 39–117)
ALT: 43 U/L (ref 0–53)
AST: 31 U/L (ref 0–37)
Albumin: 4.9 g/dL (ref 3.5–5.2)
BILIRUBIN TOTAL: 0.6 mg/dL (ref 0.2–1.2)
BUN: 22 mg/dL (ref 6–23)
CALCIUM: 10 mg/dL (ref 8.4–10.5)
CO2: 31 mEq/L (ref 19–32)
Chloride: 99 mEq/L (ref 96–112)
Creatinine, Ser: 0.94 mg/dL (ref 0.40–1.50)
GFR: 96.1 mL/min (ref >60.00)
GLUCOSE: 85 mg/dL (ref 70–99)
POTASSIUM: 3.6 meq/L (ref 3.5–5.1)
Sodium: 137 mEq/L (ref 135–145)
TOTAL PROTEIN: 7.6 g/dL (ref 6.0–8.3)

## 2016-03-20 LAB — T4, FREE: Free T4: 0.71 ng/dL (ref 0.60–1.60)

## 2016-04-05 ENCOUNTER — Other Ambulatory Visit: Payer: Self-pay | Admitting: Internal Medicine

## 2016-04-05 DIAGNOSIS — F988 Other specified behavioral and emotional disorders with onset usually occurring in childhood and adolescence: Secondary | ICD-10-CM

## 2016-04-05 MED ORDER — AMPHETAMINE-DEXTROAMPHET ER 20 MG PO CP24: 20.0000 mg | ORAL_CAPSULE | ORAL | Status: DC

## 2016-04-05 NOTE — Telephone Encounter (Signed)
We are having printer issues and are unable to print the rx. Will someone do this Friday, please!!!

## 2016-04-09 MED ORDER — AMPHETAMINE-DEXTROAMPHET ER 20 MG PO CP24: 20.0000 mg | ORAL_CAPSULE | ORAL | Status: DC

## 2016-04-09 NOTE — Telephone Encounter (Signed)
Dr Danise Mina signed the rx. I sent a MyChart message to the pt

## 2016-04-09 NOTE — Addendum Note (Signed)
Addended by: Pilar Grammes on: 04/09/2016 09:18 AM   Modules accepted: Orders

## 2016-04-09 NOTE — Telephone Encounter (Signed)
Dr Darnell Level, do you mind signing this script for this pt in Dr Alla German absence. I was hoping someone would have printed it out on Friday when I was not here, but it was not done.

## 2016-04-16 ENCOUNTER — Other Ambulatory Visit: Payer: Self-pay | Admitting: Internal Medicine

## 2016-04-16 MED ORDER — HYDROCODONE-ACETAMINOPHEN 5-325 MG PO TABS: 1.0000 | ORAL_TABLET | Freq: Two times a day (BID) | ORAL | Status: DC | PRN

## 2016-04-16 NOTE — Telephone Encounter (Signed)
Last written 03-19-16 #60 Last OV 03-19-16. No future OV

## 2016-04-16 NOTE — Telephone Encounter (Signed)
Rx done. 

## 2016-04-17 ENCOUNTER — Other Ambulatory Visit: Payer: Self-pay | Admitting: Internal Medicine

## 2016-04-18 ENCOUNTER — Other Ambulatory Visit: Payer: Self-pay

## 2016-04-18 MED ORDER — SILDENAFIL CITRATE 20 MG PO TABS: ORAL_TABLET | ORAL | Status: DC

## 2016-04-18 NOTE — Telephone Encounter (Signed)
Rx sent electronically.  

## 2016-05-03 ENCOUNTER — Other Ambulatory Visit: Payer: Self-pay | Admitting: Family Medicine

## 2016-05-03 ENCOUNTER — Other Ambulatory Visit: Payer: Self-pay | Admitting: Internal Medicine

## 2016-05-03 DIAGNOSIS — F988 Other specified behavioral and emotional disorders with onset usually occurring in childhood and adolescence: Secondary | ICD-10-CM

## 2016-05-03 MED ORDER — AMPHETAMINE-DEXTROAMPHET ER 20 MG PO CP24: 20.0000 mg | ORAL_CAPSULE | ORAL | Status: DC

## 2016-05-03 MED ORDER — CITALOPRAM HYDROBROMIDE 40 MG PO TABS: 40.0000 mg | ORAL_TABLET | Freq: Every day | ORAL | Status: DC

## 2016-05-03 NOTE — Telephone Encounter (Signed)
Was he supposed to be getting 3 months of adderall (have I done that before?)

## 2016-05-03 NOTE — Telephone Encounter (Signed)
Adderall last printed 04-09-16 Citalopra, last filled 01-26-16 #90 Last OV 03-19-16 No Future OV

## 2016-05-03 NOTE — Telephone Encounter (Signed)
No. Last month we had printer issues and it looks like we did it 3 times, but he only got 1. This morning, I could not get it to print from the computer I was using. MyChart message sent to pt to pickup adderall.

## 2016-05-09 ENCOUNTER — Encounter: Payer: Self-pay | Admitting: Internal Medicine

## 2016-05-10 ENCOUNTER — Encounter: Payer: Self-pay | Admitting: Internal Medicine

## 2016-05-10 ENCOUNTER — Ambulatory Visit (INDEPENDENT_AMBULATORY_CARE_PROVIDER_SITE_OTHER): Payer: BC Managed Care – PPO | Admitting: Internal Medicine

## 2016-05-10 VITALS — BP 124/80 | Temp 98.0°F | Wt 267.0 lb

## 2016-05-10 DIAGNOSIS — L255 Unspecified contact dermatitis due to plants, except food: Secondary | ICD-10-CM

## 2016-05-10 MED ORDER — PREDNISONE 10 MG PO TABS: ORAL_TABLET | ORAL | 0 refills | Status: DC

## 2016-05-10 MED ORDER — FLUOCINONIDE-E 0.05 % EX CREA: 1.0000 "application " | TOPICAL_CREAM | Freq: Two times a day (BID) | CUTANEOUS | 0 refills | Status: DC

## 2016-05-10 MED ORDER — METHYLPREDNISOLONE ACETATE 80 MG/ML IJ SUSP
80.0000 mg | Freq: Once | INTRAMUSCULAR | Status: AC
  Administered 2016-05-10: 80 mg via INTRAMUSCULAR

## 2016-05-10 NOTE — Patient Instructions (Addendum)
  Cool compresses  Avoidance as discussed   Prednisone steroid creams work better on the newer lesiions and preventing    meds as planned  Fu if needed .  ( poWer in the office   went out before could be given to patient before leaving) given instructions verbally.

## 2016-05-10 NOTE — Progress Notes (Signed)
Pre visit review using our clinic review tool, if applicable. No additional management support is needed unless otherwise documented below in the visit note. 

## 2016-05-10 NOTE — Progress Notes (Signed)
Chief Complaint  Patient presents with  . Poison Ivy    On both arms    HPI: Myrl Annese 35 y.o.  pcp NA  Onset after Yard work  Pulling vines     2 days ago  No long sleeves thought was careful but itching rash forearms  Some legs and no in groin lower abd areae  unocmfortable last pm and used HCS 2.5 %  Hx PI in past  No resp sx  ? Did he spread it  When  Getting sweaty Jogging   .  The next day?   ROS: See pertinent positives and negatives per HPI.  Past Medical History:  Diagnosis Date  . ADHD, predominantly inattentive type   . Elevated LFTs 11/19/2013  . Erectile dysfunction   . Generalized anxiety disorder   . Hypertension   . Recurrent cold sores   . Seborrheic dermatitis     No family history on file.  Social History   Social History  . Marital status: Married    Spouse name: N/A  . Number of children: 2  . Years of education: N/A   Occupational History  . PE teacher     [employer]   Social History Main Topics  . Smoking status: Never Smoker  . Smokeless tobacco: Never Used  . Alcohol use No     Comment: no alcohol in several months  . Drug use: No  . Sexual activity: Not Asked   Other Topics Concern  . 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 amphetamine-dextroamphetamine (ADDERALL XR) 20 MG 24 hr capsule Take 1 capsule (20 mg total) by mouth every morning. 30 capsule 0  . amphetamine-dextroamphetamine (ADDERALL XR) 20 MG 24 hr capsule Take 1 capsule (20 mg total) by mouth every morning. 30 capsule 0  . amphetamine-dextroamphetamine (ADDERALL XR) 20 MG 24 hr capsule Take 1 capsule (20 mg total) by mouth every morning. 30 capsule 0  . citalopram (CELEXA) 40 MG tablet Take 1 tablet (40 mg total) by mouth daily. 90 tablet 3  . HYDROcodone-acetaminophen (NORCO/VICODIN) 5-325 MG tablet Take 1 tablet by  mouth 2 (two) times daily as needed for moderate pain. 60 tablet 0  . hydrocortisone 2.5 % cream APPLY TOPICALLY 3 (THREE) TIMES DAILY AS NEEDED. 453.6 g 3  . indomethacin (INDOCIN) 50 MG capsule Take 1 capsule (50 mg total) by mouth 3 (three) times daily as needed. 30 capsule 0  . losartan-hydrochlorothiazide (HYZAAR) 100-25 MG tablet TAKE 1 TABLET BY MOUTH DAILY. 90 tablet 0  . sildenafil (REVATIO) 20 MG tablet TAKE 3-5 TABLETS BY MOUTH DAILY AS NEEDED 50 tablet 11  . valACYclovir (VALTREX) 1000 MG tablet Two tablets by mouth every 12 hours x two doses 4 tablet 3  . ketoconazole (NIZORAL) 2 % shampoo Apply 1 application topically 2 (two) times a week. 120 mL 5   No facility-administered medications prior to visit.      EXAM:  BP 124/80   Temp 98 F (36.7 C) (Oral)   Wt 267 lb (121.1 kg)   BMI 34.51 kg/m   Body mass index is 34.51 kg/m.  GENERAL: vitals reviewed and listed above, alert, oriented, appears well hydrated and in no acute distress HEENT: atraumatic, conjunctiva  clear, no obvious abnormalities on inspection of external nose and ears MS:  moves all extremities without noticeable focal  Abnormality Skin  Multiple numberous  CD some blistering forearms  In linear distribution some on legs  Face clear   Left abd groin area   PSYCH: pleasant and cooperative, no obvious depression or anxiety  ASSESSMENT AND PLAN:  Discussed the following assessment and plan:  Plant dermatitis - Plan: methylPREDNISolone acetate (DEPO-MEDROL) injection 80 mg dsic options   Systemic pred indicated cause of extensiveness  2 days out and new areas beginning   He prefers beginning with injection   Will add  Oral as planned  Topical rx for new lesions and future use Counseled. prevnetion in future  Pt aware  -Patient advised to return or notify health care team  if symptoms worsen ,persist or new concerns arise.  Patient Instructions   Cool compresses  Avoidance as discussed   Prednisone  steroid creams work better on the newer lesiions and preventing    meds as planned  Fu if needed .  ( poWer in the office   went out before could be given to patient before leaving) given instructions verbally.     Standley Brooking. Salene Mohamud M.D.

## 2016-05-21 ENCOUNTER — Telehealth: Payer: Self-pay

## 2016-05-21 ENCOUNTER — Other Ambulatory Visit: Payer: Self-pay | Admitting: Internal Medicine

## 2016-05-21 MED ORDER — HYDROCODONE-ACETAMINOPHEN 5-325 MG PO TABS: 1.0000 | ORAL_TABLET | Freq: Two times a day (BID) | ORAL | 0 refills | Status: DC | PRN

## 2016-05-21 NOTE — Telephone Encounter (Addendum)
Last printed 04-16-16 #60 Last OV 03-29-16 No Future OV

## 2016-05-21 NOTE — Telephone Encounter (Signed)
MyChart message sent to pt that rx was up front 

## 2016-05-21 NOTE — Telephone Encounter (Signed)
Pt left /vm requesting status of hydrocodone rx. Pt request cb.

## 2016-05-22 NOTE — Telephone Encounter (Signed)
This was already taken care of through a MyChart message yesterday

## 2016-06-08 ENCOUNTER — Other Ambulatory Visit: Payer: Self-pay | Admitting: Internal Medicine

## 2016-06-08 DIAGNOSIS — F988 Other specified behavioral and emotional disorders with onset usually occurring in childhood and adolescence: Secondary | ICD-10-CM

## 2016-06-08 NOTE — Telephone Encounter (Signed)
Last printed 05-03-16 #30 Last OV 03-19-16 No Future OV  MyChart message sent to pt that it will be Monday before it is addressed

## 2016-06-09 NOTE — Telephone Encounter (Signed)
Approved: #30 x 0 Please print

## 2016-06-11 MED ORDER — AMPHETAMINE-DEXTROAMPHET ER 20 MG PO CP24: 20.0000 mg | ORAL_CAPSULE | ORAL | 0 refills | Status: DC

## 2016-06-11 NOTE — Telephone Encounter (Signed)
MyChart message sent to pt that rx is up front ready for pickup 

## 2016-06-11 NOTE — Telephone Encounter (Signed)
Rx printed and waiting for signature 

## 2016-06-19 ENCOUNTER — Other Ambulatory Visit: Payer: Self-pay | Admitting: Internal Medicine

## 2016-06-19 MED ORDER — HYDROCODONE-ACETAMINOPHEN 5-325 MG PO TABS: 1.0000 | ORAL_TABLET | Freq: Two times a day (BID) | ORAL | 0 refills | Status: DC | PRN

## 2016-06-19 NOTE — Telephone Encounter (Signed)
Last written 05-21-16 #60 Last OV Acute 05-10-16 No future OV

## 2016-07-11 ENCOUNTER — Other Ambulatory Visit: Payer: Self-pay | Admitting: Internal Medicine

## 2016-07-11 DIAGNOSIS — F988 Other specified behavioral and emotional disorders with onset usually occurring in childhood and adolescence: Secondary | ICD-10-CM

## 2016-07-11 MED ORDER — AMPHETAMINE-DEXTROAMPHET ER 20 MG PO CP24: 20.0000 mg | ORAL_CAPSULE | ORAL | 0 refills | Status: DC

## 2016-07-11 NOTE — Telephone Encounter (Signed)
He needs to set up a follow up in the next month or so

## 2016-07-11 NOTE — Telephone Encounter (Signed)
Last printed 06-11-16 #30 Last OV 03-19-16 No Future OV

## 2016-07-11 NOTE — Telephone Encounter (Signed)
MyChart sent to pt. Rx up front for pickup

## 2016-07-15 ENCOUNTER — Other Ambulatory Visit: Payer: Self-pay | Admitting: Internal Medicine

## 2016-07-17 ENCOUNTER — Encounter: Payer: Self-pay | Admitting: Internal Medicine

## 2016-07-17 ENCOUNTER — Other Ambulatory Visit: Payer: Self-pay | Admitting: Internal Medicine

## 2016-07-17 MED ORDER — HYDROCODONE-ACETAMINOPHEN 5-325 MG PO TABS: 1.0000 | ORAL_TABLET | Freq: Two times a day (BID) | ORAL | 0 refills | Status: DC | PRN

## 2016-07-17 NOTE — Telephone Encounter (Signed)
Rx last written 06-19-16 #60 Last OV Acute 05-10-16 No future OV

## 2016-07-18 NOTE — Telephone Encounter (Signed)
MyChart message sent to pt that rx is up front ready for pickup 

## 2016-08-16 ENCOUNTER — Other Ambulatory Visit: Payer: Self-pay

## 2016-08-16 NOTE — Telephone Encounter (Signed)
Pt left v/m requesting rx hydrocodone apap. Call when ready for pick up. rx last printed # 60 on 07/17/16; pt last annual 03/19/16 and pt has appt on 08/20/16 to see Dr Silvio Pate. Pt request to pick up rx on 08/17/16.

## 2016-08-17 MED ORDER — HYDROCODONE-ACETAMINOPHEN 5-325 MG PO TABS: 1.0000 | ORAL_TABLET | Freq: Two times a day (BID) | ORAL | 0 refills | Status: DC | PRN

## 2016-08-17 NOTE — Telephone Encounter (Signed)
Spoke to pt. Rx up front. 

## 2016-08-20 ENCOUNTER — Encounter: Payer: Self-pay | Admitting: Internal Medicine

## 2016-08-20 ENCOUNTER — Ambulatory Visit (INDEPENDENT_AMBULATORY_CARE_PROVIDER_SITE_OTHER): Payer: BC Managed Care – PPO | Admitting: Internal Medicine

## 2016-08-20 VITALS — BP 122/90 | HR 100 | Temp 98.2°F | Wt 249.0 lb

## 2016-08-20 DIAGNOSIS — G8929 Other chronic pain: Secondary | ICD-10-CM

## 2016-08-20 DIAGNOSIS — F411 Generalized anxiety disorder: Secondary | ICD-10-CM | POA: Diagnosis not present

## 2016-08-20 DIAGNOSIS — Z23 Encounter for immunization: Secondary | ICD-10-CM | POA: Diagnosis not present

## 2016-08-20 DIAGNOSIS — M549 Dorsalgia, unspecified: Secondary | ICD-10-CM | POA: Diagnosis not present

## 2016-08-20 DIAGNOSIS — F900 Attention-deficit hyperactivity disorder, predominantly inattentive type: Secondary | ICD-10-CM | POA: Diagnosis not present

## 2016-08-20 MED ORDER — TRAMADOL HCL 50 MG PO TABS: 50.0000 mg | ORAL_TABLET | Freq: Two times a day (BID) | ORAL | 0 refills | Status: DC | PRN

## 2016-08-20 NOTE — Assessment & Plan Note (Signed)
Will switch to tramadol Has tolerated this despite the SSRI Side effects with hydrocodone Maximum BID

## 2016-08-20 NOTE — Addendum Note (Signed)
Addended by: Pilar Grammes on: 08/20/2016 05:00 PM   Modules accepted: Orders

## 2016-08-20 NOTE — Progress Notes (Signed)
Pre visit review using our clinic review tool, if applicable. No additional management support is needed unless otherwise documented below in the visit note. 

## 2016-08-20 NOTE — Assessment & Plan Note (Signed)
Controlled with the citalopram

## 2016-08-20 NOTE — Progress Notes (Signed)
   Subjective:    Patient ID: Mark Burns, male    DOB: 11-11-1980, 35 y.o.   MRN: [id number]  HPI Here for follow up of pain management  He feels he does better with the tramadol Feels the hydrocodone makes him more groggy and irritable He has taken this before with the SSRI--and no problems  Happy with the adderall "scatterbrain" otherwise Able to focus at work Actually feels the med helps his anxiety--- he is able to succeed with his workload  Current Outpatient Prescriptions on File Prior to Visit  Medication Sig Dispense Refill  . amphetamine-dextroamphetamine (ADDERALL XR) 20 MG 24 hr capsule Take 1 capsule (20 mg total) by mouth every morning. 30 capsule 0  . amphetamine-dextroamphetamine (ADDERALL XR) 20 MG 24 hr capsule Take 1 capsule (20 mg total) by mouth every morning. 30 capsule 0  . amphetamine-dextroamphetamine (ADDERALL XR) 20 MG 24 hr capsule Take 1 capsule (20 mg total) by mouth every morning. 30 capsule 0  . citalopram (CELEXA) 40 MG tablet Take 1 tablet (40 mg total) by mouth daily. 90 tablet 3  . fluocinonide-emollient (LIDEX-E) 0.05 % cream Apply 1 application topically 2 (two) times daily. For poison ivy 30 g 0  . HYDROcodone-acetaminophen (NORCO/VICODIN) 5-325 MG tablet Take 1 tablet by mouth 2 (two) times daily as needed for moderate pain. 60 tablet 0  . hydrocortisone 2.5 % cream APPLY TOPICALLY 3 (THREE) TIMES DAILY AS NEEDED. 453.6 g 3  . indomethacin (INDOCIN) 50 MG capsule Take 1 capsule (50 mg total) by mouth 3 (three) times daily as needed. 30 capsule 0  . losartan-hydrochlorothiazide (HYZAAR) 100-25 MG tablet TAKE 1 TABLET BY MOUTH DAILY. 90 tablet 0  . sildenafil (REVATIO) 20 MG tablet TAKE 3-5 TABLETS BY MOUTH DAILY AS NEEDED 50 tablet 11  . valACYclovir (VALTREX) 1000 MG tablet Two tablets by mouth every 12 hours x two doses 4 tablet 3   No current facility-administered medications on file prior to visit.     Allergies  Allergen Reactions  . Sulfa  Antibiotics Hives    Past Medical History:  Diagnosis Date  . ADHD, predominantly inattentive type   . Elevated LFTs 11/19/2013  . Erectile dysfunction   . Generalized anxiety disorder   . Hypertension   . Recurrent cold sores   . Seborrheic dermatitis     Past Surgical History:  Procedure Laterality Date  . VASECTOMY  2014    No family history on file.  Social History   Social History  . Marital status: Married    Spouse name: N/A  . Number of children: 2  . Years of education: N/A   Occupational History  . PE teacher     [employer]   Social History Main Topics  . Smoking status: Never Smoker  . Smokeless tobacco: Never Used  . Alcohol use No     Comment: no alcohol in several months  . Drug use: No  . Sexual activity: Not on file   Other Topics Concern  . Not on file   Social History Narrative       Review of Systems  Sleeps okay Appetite is good Working out more-- lost 18#     Objective:   Physical Exam  Constitutional: He appears well-developed and well-nourished. No distress.  Psychiatric: He has a normal mood and affect. His behavior is normal.          Assessment & Plan:

## 2016-08-20 NOTE — Assessment & Plan Note (Signed)
Doing well with the adderall

## 2016-08-29 ENCOUNTER — Other Ambulatory Visit: Payer: Self-pay | Admitting: Internal Medicine

## 2016-08-29 DIAGNOSIS — F988 Other specified behavioral and emotional disorders with onset usually occurring in childhood and adolescence: Secondary | ICD-10-CM

## 2016-08-29 MED ORDER — AMPHETAMINE-DEXTROAMPHET ER 20 MG PO CP24: 20.0000 mg | ORAL_CAPSULE | ORAL | 0 refills | Status: DC

## 2016-08-29 NOTE — Telephone Encounter (Signed)
Last printed 07-11-16 Last OV 08-20-16 Next OV 02-21-17

## 2016-09-18 ENCOUNTER — Other Ambulatory Visit: Payer: Self-pay

## 2016-09-18 NOTE — Telephone Encounter (Signed)
Pt left v/m; pt had previously requested taking tramadol instead of hydrocodone apap; tramadol is not effective with back pain and pt request rx for hydrocodone apap. Last printed hydrocodone apap  # 60 on 08/17/16; last seen 08/20/16. Please advise.

## 2016-09-19 MED ORDER — HYDROCODONE-ACETAMINOPHEN 5-325 MG PO TABS: 1.0000 | ORAL_TABLET | Freq: Two times a day (BID) | ORAL | 0 refills | Status: DC | PRN

## 2016-09-19 NOTE — Telephone Encounter (Signed)
Spoke to pt. Rx up front to pickup

## 2016-10-05 ENCOUNTER — Other Ambulatory Visit: Payer: Self-pay

## 2016-10-05 NOTE — Telephone Encounter (Signed)
Pt left v/m requesting rx for Adderall. Call when ready for pick up. rx last printed # 30 x 1 on 08/29/16. Last seen 08/20/16. Pt is aware will be next week when gets rx.

## 2016-10-06 NOTE — Telephone Encounter (Signed)
Approved:okay to prepare Rx #30 x 0

## 2016-10-09 MED ORDER — AMPHETAMINE-DEXTROAMPHET ER 20 MG PO CP24: 20.0000 mg | ORAL_CAPSULE | ORAL | 0 refills | Status: DC

## 2016-10-09 NOTE — Telephone Encounter (Signed)
Left message on VM per DPR. Rx up front ready for pickup 

## 2016-10-09 NOTE — Telephone Encounter (Signed)
Rx printed and waiting for signature 

## 2016-10-18 ENCOUNTER — Other Ambulatory Visit: Payer: Self-pay | Admitting: Internal Medicine

## 2016-10-18 MED ORDER — HYDROCODONE-ACETAMINOPHEN 5-325 MG PO TABS: 1.0000 | ORAL_TABLET | Freq: Two times a day (BID) | ORAL | 0 refills | Status: DC | PRN

## 2016-10-18 NOTE — Telephone Encounter (Signed)
Last written 09-19-16 #60 Last OV 08-20-16 Next OV 02-21-17

## 2016-10-27 ENCOUNTER — Other Ambulatory Visit: Payer: Self-pay | Admitting: Internal Medicine

## 2016-11-02 ENCOUNTER — Other Ambulatory Visit: Payer: Self-pay | Admitting: Internal Medicine

## 2016-11-06 ENCOUNTER — Other Ambulatory Visit: Payer: Self-pay | Admitting: Internal Medicine

## 2016-11-15 ENCOUNTER — Other Ambulatory Visit: Payer: Self-pay | Admitting: Internal Medicine

## 2016-11-15 MED ORDER — HYDROCODONE-ACETAMINOPHEN 5-325 MG PO TABS: 1.0000 | ORAL_TABLET | Freq: Two times a day (BID) | ORAL | 0 refills | Status: DC | PRN

## 2016-11-15 NOTE — Telephone Encounter (Signed)
Last written 10-18-16 #60 Last OV 08-20-16 Next OV 02-21-17

## 2016-11-26 ENCOUNTER — Other Ambulatory Visit: Payer: Self-pay | Admitting: Internal Medicine

## 2016-11-26 MED ORDER — INDOMETHACIN 50 MG PO CAPS: 50.0000 mg | ORAL_CAPSULE | Freq: Three times a day (TID) | ORAL | 0 refills | Status: DC | PRN

## 2016-12-04 ENCOUNTER — Telehealth: Payer: BC Managed Care – PPO | Admitting: Family

## 2016-12-04 ENCOUNTER — Encounter: Payer: Self-pay | Admitting: Internal Medicine

## 2016-12-04 DIAGNOSIS — M5440 Lumbago with sciatica, unspecified side: Secondary | ICD-10-CM

## 2016-12-04 NOTE — Progress Notes (Signed)
Based on what you shared with me it looks like you have a serious condition that should be evaluated in a face to face office visit.  NOTE: Even if you have entered your credit card information for this eVisit, you will not be charged.   If you are having a true medical emergency please call 911.  If you need an urgent face to face visit, [facility] has four urgent care centers for your convenience.  If you need care fast and have a high deductible or no insurance consider:   [email or web address]  [phone]  [address], [address] [place], [place] [address] 8 am to 8 pm Monday-Friday 10 am to 4 pm Saturday-Sunday   The following sites will take your  insurance:    . [facility]  [phone] Get Driving Directions Find a Provider at this Location  [address] [place], [place] [address] . 10 am to 8 pm Monday-Friday . 12 pm to 8 pm Saturday-Sunday   . [facility]  [phone] Get Driving Directions Find a Provider at this Location  [address], [address] [place], [place] [address] . 8 am to 8 pm Monday-Friday . 9 am to 6 pm Saturday . 11 am to 6 pm Sunday   . [facility] [place]  [phone] Get Driving Directions  [address] [place],  [address] . 8 am to 8 pm Monday-Friday . 8 am to 4 pm Saturday-Sunday   Your e-visit answers were reviewed by a board certified advanced clinical practitioner to complete your personal care plan.  Thank you for using e-Visits.  

## 2016-12-14 ENCOUNTER — Other Ambulatory Visit: Payer: Self-pay | Admitting: Internal Medicine

## 2016-12-17 ENCOUNTER — Other Ambulatory Visit: Payer: Self-pay | Admitting: Internal Medicine

## 2016-12-17 ENCOUNTER — Encounter: Payer: Self-pay | Admitting: Internal Medicine

## 2016-12-17 MED ORDER — HYDROCODONE-ACETAMINOPHEN 5-325 MG PO TABS
1.0000 | ORAL_TABLET | Freq: Two times a day (BID) | ORAL | 0 refills | Status: DC | PRN
Start: 2016-12-17 — End: 2017-01-14

## 2016-12-17 MED ORDER — INDOMETHACIN 50 MG PO CAPS: 50.0000 mg | ORAL_CAPSULE | Freq: Three times a day (TID) | ORAL | 0 refills | Status: DC | PRN

## 2016-12-17 NOTE — Telephone Encounter (Signed)
Notified patient that Rx is ready for pick up. Left in the front office.

## 2016-12-17 NOTE — Telephone Encounter (Signed)
Last filled 11/15/2016. Last f/u 08/2016

## 2016-12-18 ENCOUNTER — Encounter: Payer: Self-pay | Admitting: Family Medicine

## 2016-12-18 ENCOUNTER — Ambulatory Visit: Payer: BC Managed Care – PPO | Admitting: Family Medicine

## 2016-12-18 NOTE — Telephone Encounter (Signed)
Per chart this has already been handled by Dr. Alla German office. Nothing further needed.

## 2016-12-20 ENCOUNTER — Telehealth: Payer: Self-pay

## 2016-12-20 NOTE — Telephone Encounter (Signed)
Per Dr Silvio Pate, pt needs a UDS at next refill of controlled substance.

## 2017-01-08 ENCOUNTER — Other Ambulatory Visit: Payer: Self-pay | Admitting: Primary Care

## 2017-01-08 MED ORDER — INDOMETHACIN 50 MG PO CAPS: 50.0000 mg | ORAL_CAPSULE | Freq: Three times a day (TID) | ORAL | 0 refills | Status: DC | PRN

## 2017-01-14 ENCOUNTER — Other Ambulatory Visit: Payer: BC Managed Care – PPO

## 2017-01-14 ENCOUNTER — Other Ambulatory Visit: Payer: Self-pay | Admitting: Primary Care

## 2017-01-14 ENCOUNTER — Encounter: Payer: Self-pay | Admitting: Internal Medicine

## 2017-01-14 DIAGNOSIS — Z0283 Encounter for blood-alcohol and blood-drug test: Secondary | ICD-10-CM

## 2017-01-14 MED ORDER — HYDROCODONE-ACETAMINOPHEN 5-325 MG PO TABS: 1.0000 | ORAL_TABLET | Freq: Two times a day (BID) | ORAL | 0 refills | Status: DC | PRN

## 2017-01-14 NOTE — Telephone Encounter (Signed)
Last printed 12-17-16 #60 Last OV 08-20-16 Next OV 02-21-17  Pt needs Tox Screen. Do you want to wait until his OV in May or do it this month with this refill?

## 2017-01-14 NOTE — Telephone Encounter (Signed)
Go ahead and do it now

## 2017-01-14 NOTE — Telephone Encounter (Signed)
Rx up front. MyChart message sent to pt. 

## 2017-01-18 LAB — TOXASSURE SELECT 13 (MW), URINE

## 2017-02-11 ENCOUNTER — Other Ambulatory Visit: Payer: Self-pay | Admitting: Internal Medicine

## 2017-02-11 NOTE — Telephone Encounter (Signed)
Approved: okay to prepare #60 x 0 

## 2017-02-11 NOTE — Telephone Encounter (Signed)
Norco last filled 01/14/17-- please advise

## 2017-02-12 MED ORDER — HYDROCODONE-ACETAMINOPHEN 5-325 MG PO TABS: 1.0000 | ORAL_TABLET | Freq: Two times a day (BID) | ORAL | 0 refills | Status: DC | PRN

## 2017-02-12 MED ORDER — INDOMETHACIN 50 MG PO CAPS
50.0000 mg | ORAL_CAPSULE | Freq: Three times a day (TID) | ORAL | 3 refills | Status: DC | PRN
Start: 2017-02-12 — End: 2017-03-05

## 2017-02-12 NOTE — Telephone Encounter (Signed)
Lm on pts vm and informed him Rx is available for pickup from the front desk 

## 2017-02-12 NOTE — Telephone Encounter (Signed)
Rx for hydrocodone printed and waiting for signature

## 2017-02-19 ENCOUNTER — Encounter: Payer: Self-pay | Admitting: Internal Medicine

## 2017-02-19 ENCOUNTER — Ambulatory Visit (INDEPENDENT_AMBULATORY_CARE_PROVIDER_SITE_OTHER): Payer: BC Managed Care – PPO | Admitting: Internal Medicine

## 2017-02-19 VITALS — BP 112/84 | HR 86 | Temp 98.0°F | Wt 256.0 lb

## 2017-02-19 DIAGNOSIS — M549 Dorsalgia, unspecified: Secondary | ICD-10-CM

## 2017-02-19 DIAGNOSIS — G8929 Other chronic pain: Secondary | ICD-10-CM

## 2017-02-19 DIAGNOSIS — F411 Generalized anxiety disorder: Secondary | ICD-10-CM | POA: Diagnosis not present

## 2017-02-19 MED ORDER — DULOXETINE HCL 30 MG PO CPEP: 30.0000 mg | ORAL_CAPSULE | Freq: Every day | ORAL | 3 refills | Status: DC

## 2017-02-19 NOTE — Assessment & Plan Note (Signed)
Ongoing symptoms despite stopping the adderall Discussed options Will try adding duloxetine--hopefully will help pain also Might need to consider psychiatrist &/or antipsychotic

## 2017-02-19 NOTE — Progress Notes (Signed)
   Subjective:    Patient ID: Mark Burns, male    DOB: Feb 20, 1981, 36 y.o.   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 citalopram (CELEXA) 40 MG tablet TAKE 1 TABLET (40 MG TOTAL) BY MOUTH DAILY. 90 tablet 1  . fluocinonide-emollient (LIDEX-E) 0.05 % cream Apply 1 application topically 2 (two) times daily. For poison ivy 30 g 0  . HYDROcodone-acetaminophen (NORCO/VICODIN) 5-325 MG tablet Take 1 tablet by mouth 2 (two) times daily as needed for moderate pain. 60 tablet 0  . hydrocortisone 2.5 % cream APPLY TOPICALLY 3 (THREE) TIMES DAILY AS NEEDED. 453.6 g 3  . indomethacin (INDOCIN) 50 MG capsule Take 1 capsule (50 mg total) by mouth 3 (three) times daily as needed. 30 capsule 3  . losartan-hydrochlorothiazide (HYZAAR) 100-25 MG tablet TAKE 1 TABLET BY MOUTH DAILY. 90 tablet 1  . sildenafil (REVATIO) 20 MG tablet TAKE 3-5 TABLETS BY MOUTH DAILY AS NEEDED 50 tablet 11  . valACYclovir (VALTREX) 1000 MG tablet Two tablets by mouth every 12 hours x two doses 4 tablet 3   No current facility-administered medications on file prior to visit.     Allergies  Allergen Reactions  . Sulfa Antibiotics Hives    Past Medical History:  Diagnosis Date  . ADHD, predominantly inattentive type   . Elevated LFTs 11/19/2013  . Erectile dysfunction   . Generalized anxiety disorder   . Hypertension   . Recurrent cold sores   . Seborrheic dermatitis     Past Surgical History:  Procedure  Laterality Date  . VASECTOMY  2014    No family history on file.  Social History   Social History  . Marital status: Married    Spouse name: N/A  . Number of children: 2  . Years of education: N/A   Occupational History  . PE teacher     [employer]   Social History Main Topics  . Smoking status: Never Smoker  . Smokeless tobacco: Never Used  . Alcohol use No     Comment: no alcohol in several months  . Drug use: No  . Sexual activity: Not on file   Other Topics Concern  . Not on file   Social History Narrative       Review of Systems Sleeping okay--but not great Appetite is fine Weight is stable     Objective:   Physical Exam  Psychiatric:  Slightly on edge Not overtly depressed          Assessment & Plan:

## 2017-02-19 NOTE — Progress Notes (Signed)
Pre visit review using our clinic review tool, if applicable. No additional management support is needed unless otherwise documented below in the visit note. 

## 2017-02-19 NOTE — Assessment & Plan Note (Signed)
Checked CSRS---nothing other than this office Will continue the hydrocodone

## 2017-02-21 ENCOUNTER — Ambulatory Visit: Payer: BC Managed Care – PPO | Admitting: Internal Medicine

## 2017-03-05 ENCOUNTER — Encounter: Payer: Self-pay | Admitting: Internal Medicine

## 2017-03-05 MED ORDER — INDOMETHACIN 50 MG PO CAPS: 50.0000 mg | ORAL_CAPSULE | Freq: Three times a day (TID) | ORAL | 3 refills | Status: DC | PRN

## 2017-03-07 ENCOUNTER — Other Ambulatory Visit: Payer: Self-pay | Admitting: Internal Medicine

## 2017-03-07 NOTE — Telephone Encounter (Signed)
Last office visit 02/19/2017.  Last refilled 02/12/2017 for #60.

## 2017-03-12 MED ORDER — HYDROCODONE-ACETAMINOPHEN 5-325 MG PO TABS: 1.0000 | ORAL_TABLET | Freq: Two times a day (BID) | ORAL | 0 refills | Status: DC | PRN

## 2017-03-12 NOTE — Telephone Encounter (Signed)
Approved: okay #60 x 0 Can prepare Rx

## 2017-03-12 NOTE — Telephone Encounter (Signed)
Rx printed and waiting on signature 

## 2017-03-29 ENCOUNTER — Encounter: Payer: Self-pay | Admitting: Internal Medicine

## 2017-03-29 NOTE — Telephone Encounter (Signed)
Please confirm what he needs Okay to refill citalopram for a year if needed He is asking for cymbalta --which I just started but gave refills on (so he shouldn't need more refills before his next visit)

## 2017-03-29 NOTE — Telephone Encounter (Signed)
Left detailed message.  PT TO CALL BACK TO CONFIRM WHICH RX HE NEEDS

## 2017-03-30 ENCOUNTER — Other Ambulatory Visit: Payer: Self-pay | Admitting: Internal Medicine

## 2017-04-01 ENCOUNTER — Other Ambulatory Visit: Payer: Self-pay | Admitting: Internal Medicine

## 2017-04-01 NOTE — Telephone Encounter (Signed)
Rx filled 04/18/2016 #50 11R

## 2017-04-01 NOTE — Telephone Encounter (Signed)
Lm on pts vm requesting a call back to confirm which meds are needing to be refilled

## 2017-04-02 NOTE — Telephone Encounter (Signed)
Last Rx 04/2016 #50 11R. Pharmacy states refill due. Last OV 02/2017

## 2017-04-02 NOTE — Telephone Encounter (Signed)
Approved:#50 x 11 

## 2017-04-08 ENCOUNTER — Other Ambulatory Visit: Payer: Self-pay | Admitting: Internal Medicine

## 2017-04-10 ENCOUNTER — Other Ambulatory Visit: Payer: Self-pay | Admitting: Internal Medicine

## 2017-04-10 ENCOUNTER — Encounter: Payer: Self-pay | Admitting: Internal Medicine

## 2017-04-10 NOTE — Telephone Encounter (Signed)
Approved: #60 x 0 

## 2017-04-10 NOTE — Telephone Encounter (Signed)
I will print it in the morning for signature

## 2017-04-10 NOTE — Telephone Encounter (Signed)
Last filled 03-12-17 #60 Last OV 02-19-17 No Future OV   UDS 01-14-17

## 2017-04-10 NOTE — Telephone Encounter (Signed)
Okay to prepare Rx for my signature at lunch tomorrow Let him know

## 2017-04-11 MED ORDER — HYDROCODONE-ACETAMINOPHEN 5-325 MG PO TABS: 1.0000 | ORAL_TABLET | Freq: Two times a day (BID) | ORAL | 0 refills | Status: DC | PRN

## 2017-04-11 NOTE — Telephone Encounter (Signed)
Rx printed and waiting on signature 

## 2017-04-11 NOTE — Telephone Encounter (Signed)
Spoke to pt. Rx up front ready for pickup 

## 2017-05-05 ENCOUNTER — Inpatient Hospital Stay (HOSPITAL_COMMUNITY)
Admission: EM | Admit: 2017-05-05 | Discharge: 2017-05-08 | DRG: 603 | Disposition: A | Discharge status: Home / Self Care | Payer: BC Managed Care – PPO | Attending: Internal Medicine | Admitting: Internal Medicine

## 2017-05-05 ENCOUNTER — Telehealth: Payer: Self-pay | Admitting: Internal Medicine

## 2017-05-05 ENCOUNTER — Emergency Department
Admission: EM | Admit: 2017-05-05 | Discharge: 2017-05-05 | Disposition: A | Discharge status: Home / Self Care | Payer: BC Managed Care – PPO

## 2017-05-05 ENCOUNTER — Encounter (HOSPITAL_COMMUNITY): Payer: Self-pay | Admitting: *Deleted

## 2017-05-05 ENCOUNTER — Emergency Department (HOSPITAL_COMMUNITY): Payer: BC Managed Care – PPO

## 2017-05-05 ENCOUNTER — Encounter (HOSPITAL_COMMUNITY): Payer: Self-pay | Admitting: Emergency Medicine

## 2017-05-05 ENCOUNTER — Emergency Department (HOSPITAL_BASED_OUTPATIENT_CLINIC_OR_DEPARTMENT_OTHER): Payer: BC Managed Care – PPO

## 2017-05-05 ENCOUNTER — Ambulatory Visit (HOSPITAL_COMMUNITY)
Admission: EM | Admit: 2017-05-05 | Discharge: 2017-05-05 | Disposition: A | Discharge status: Nursing Home (Includes ICF) | Payer: BC Managed Care – PPO | Source: Home / Self Care | Attending: Physician Assistant | Admitting: Physician Assistant

## 2017-05-05 DIAGNOSIS — M7989 Other specified soft tissue disorders: Secondary | ICD-10-CM

## 2017-05-05 DIAGNOSIS — F900 Attention-deficit hyperactivity disorder, predominantly inattentive type: Secondary | ICD-10-CM | POA: Diagnosis present

## 2017-05-05 DIAGNOSIS — B999 Unspecified infectious disease: Secondary | ICD-10-CM

## 2017-05-05 DIAGNOSIS — M79609 Pain in unspecified limb: Secondary | ICD-10-CM

## 2017-05-05 DIAGNOSIS — I10 Essential (primary) hypertension: Secondary | ICD-10-CM | POA: Diagnosis present

## 2017-05-05 DIAGNOSIS — F1010 Alcohol abuse, uncomplicated: Secondary | ICD-10-CM | POA: Diagnosis not present

## 2017-05-05 DIAGNOSIS — R609 Edema, unspecified: Secondary | ICD-10-CM | POA: Diagnosis not present

## 2017-05-05 DIAGNOSIS — F411 Generalized anxiety disorder: Secondary | ICD-10-CM | POA: Diagnosis present

## 2017-05-05 DIAGNOSIS — M25561 Pain in right knee: Secondary | ICD-10-CM

## 2017-05-05 DIAGNOSIS — G8929 Other chronic pain: Secondary | ICD-10-CM | POA: Diagnosis not present

## 2017-05-05 DIAGNOSIS — M79661 Pain in right lower leg: Secondary | ICD-10-CM

## 2017-05-05 DIAGNOSIS — F1020 Alcohol dependence, uncomplicated: Secondary | ICD-10-CM | POA: Diagnosis present

## 2017-05-05 DIAGNOSIS — M25461 Effusion, right knee: Secondary | ICD-10-CM | POA: Diagnosis not present

## 2017-05-05 DIAGNOSIS — M549 Dorsalgia, unspecified: Secondary | ICD-10-CM | POA: Diagnosis present

## 2017-05-05 DIAGNOSIS — L0390 Cellulitis, unspecified: Secondary | ICD-10-CM

## 2017-05-05 DIAGNOSIS — L03115 Cellulitis of right lower limb: Secondary | ICD-10-CM | POA: Diagnosis not present

## 2017-05-05 DIAGNOSIS — M109 Gout, unspecified: Secondary | ICD-10-CM | POA: Diagnosis present

## 2017-05-05 DIAGNOSIS — F329 Major depressive disorder, single episode, unspecified: Secondary | ICD-10-CM | POA: Diagnosis present

## 2017-05-05 DIAGNOSIS — E876 Hypokalemia: Secondary | ICD-10-CM | POA: Diagnosis present

## 2017-05-05 LAB — GRAM STAIN: SPECIAL REQUESTS: NORMAL

## 2017-05-05 LAB — CBC WITH DIFFERENTIAL/PLATELET
BASOS PCT: 0 %
Basophils Absolute: 0 10*3/uL (ref 0.0–0.1)
Eosinophils Absolute: 0.1 10*3/uL (ref 0.0–0.7)
Eosinophils Relative: 1 %
HEMATOCRIT: 38.1 % — AB (ref 39.0–52.0)
HEMOGLOBIN: 13.4 g/dL (ref 13.0–17.0)
LYMPHS ABS: 3.6 10*3/uL (ref 0.7–4.0)
LYMPHS PCT: 28 %
MCH: 30.3 pg (ref 26.0–34.0)
MCHC: 35.2 g/dL (ref 30.0–36.0)
MCV: 86.2 fL (ref 78.0–100.0)
MONOS PCT: 8 %
Monocytes Absolute: 1 10*3/uL (ref 0.1–1.0)
NEUTROS ABS: 8.2 10*3/uL — AB (ref 1.7–7.7)
Neutrophils Relative %: 63 %
Platelets: 234 10*3/uL (ref 150–400)
RBC: 4.42 MIL/uL (ref 4.22–5.81)
RDW: 12.4 % (ref 11.5–15.5)
WBC: 12.9 10*3/uL — ABNORMAL HIGH (ref 4.0–10.5)

## 2017-05-05 LAB — BASIC METABOLIC PANEL
Anion gap: 11 (ref 5–15)
BUN: 16 mg/dL (ref 6–20)
CHLORIDE: 98 mmol/L — AB (ref 101–111)
CO2: 26 mmol/L (ref 22–32)
CREATININE: 0.89 mg/dL (ref 0.61–1.24)
Calcium: 9.4 mg/dL (ref 8.9–10.3)
GFR calc Af Amer: >60 mL/min (ref >60)
GFR calc non Af Amer: >60 mL/min (ref >60)
GLUCOSE: 91 mg/dL (ref 65–99)
Potassium: 4.3 mmol/L (ref 3.5–5.1)
Sodium: 135 mmol/L (ref 135–145)

## 2017-05-05 LAB — SYNOVIAL CELL COUNT + DIFF, W/ CRYSTALS
Crystals, Fluid: NONE SEEN
EOSINOPHILS-SYNOVIAL: 0 % (ref 0–1)
Lymphocytes-Synovial Fld: 1 % (ref 0–20)
MONOCYTE-MACROPHAGE-SYNOVIAL FLUID: 22 % — AB (ref 50–90)
Neutrophil, Synovial: 77 % — ABNORMAL HIGH (ref 0–25)
WBC, SYNOVIAL: 4515 /mm3 — AB (ref 0–200)

## 2017-05-05 LAB — SEDIMENTATION RATE: Sed Rate: 31 mm/hr — ABNORMAL HIGH (ref 0–16)

## 2017-05-05 LAB — I-STAT CG4 LACTIC ACID, ED: Lactic Acid, Venous: 2.18 mmol/L (ref 0.5–1.9)

## 2017-05-05 LAB — C-REACTIVE PROTEIN: CRP: 6.8 mg/dL — ABNORMAL HIGH (ref <1.0)

## 2017-05-05 LAB — CK: CK TOTAL: 91 U/L (ref 49–397)

## 2017-05-05 MED ORDER — HYDROCHLOROTHIAZIDE 25 MG PO TABS
25.0000 mg | ORAL_TABLET | Freq: Every day | ORAL | Status: DC
  Administered 2017-05-06 – 2017-05-08 (×3): 25 mg via ORAL
  Filled 2017-05-05 (×3): qty 1

## 2017-05-05 MED ORDER — ADULT MULTIVITAMIN W/MINERALS CH
1.0000 | ORAL_TABLET | Freq: Every day | ORAL | Status: DC
  Administered 2017-05-06 – 2017-05-08 (×3): 1 via ORAL
  Filled 2017-05-05 (×3): qty 1

## 2017-05-05 MED ORDER — SODIUM CHLORIDE 0.9 % IV BOLUS (SEPSIS)
1000.0000 mL | Freq: Once | INTRAVENOUS | Status: AC
  Administered 2017-05-05: 1000 mL via INTRAVENOUS

## 2017-05-05 MED ORDER — BISACODYL 5 MG PO TBEC: 5.0000 mg | DELAYED_RELEASE_TABLET | Freq: Every day | ORAL | Status: DC | PRN

## 2017-05-05 MED ORDER — DEXTROSE 5 % IV SOLN
2.0000 g | INTRAVENOUS | Status: DC
  Administered 2017-05-05 – 2017-05-07 (×3): 2 g via INTRAVENOUS
  Filled 2017-05-05 (×3): qty 2

## 2017-05-05 MED ORDER — SENNOSIDES-DOCUSATE SODIUM 8.6-50 MG PO TABS: 1.0000 | ORAL_TABLET | Freq: Every evening | ORAL | Status: DC | PRN

## 2017-05-05 MED ORDER — ENOXAPARIN SODIUM 40 MG/0.4ML ~~LOC~~ SOLN
40.0000 mg | SUBCUTANEOUS | Status: DC
  Administered 2017-05-06 – 2017-05-08 (×3): 40 mg via SUBCUTANEOUS
  Filled 2017-05-05 (×3): qty 0.4

## 2017-05-05 MED ORDER — VITAMIN B-1 100 MG PO TABS
100.0000 mg | ORAL_TABLET | Freq: Every day | ORAL | Status: DC
  Administered 2017-05-06 – 2017-05-08 (×3): 100 mg via ORAL
  Filled 2017-05-05 (×3): qty 1

## 2017-05-05 MED ORDER — ONDANSETRON HCL 4 MG PO TABS: 4.0000 mg | ORAL_TABLET | Freq: Four times a day (QID) | ORAL | Status: DC | PRN

## 2017-05-05 MED ORDER — HYDROMORPHONE HCL 1 MG/ML IJ SOLN
1.0000 mg | Freq: Once | INTRAMUSCULAR | Status: AC
  Administered 2017-05-05: 1 mg via INTRAVENOUS
  Filled 2017-05-05: qty 1

## 2017-05-05 MED ORDER — LIDOCAINE HCL (PF) 1 % IJ SOLN
INTRAMUSCULAR | Status: AC
  Administered 2017-05-05: 10 mL
  Filled 2017-05-05: qty 30

## 2017-05-05 MED ORDER — LORAZEPAM 1 MG PO TABS
1.0000 mg | ORAL_TABLET | Freq: Four times a day (QID) | ORAL | Status: DC | PRN
  Administered 2017-05-08: 1 mg via ORAL
  Filled 2017-05-05: qty 1

## 2017-05-05 MED ORDER — ACETAMINOPHEN 325 MG PO TABS: 650.0000 mg | ORAL_TABLET | Freq: Four times a day (QID) | ORAL | Status: DC | PRN

## 2017-05-05 MED ORDER — IOPAMIDOL (ISOVUE-300) INJECTION 61%
INTRAVENOUS | Status: AC
  Administered 2017-05-05: 100 mL via INTRAVENOUS
  Filled 2017-05-05: qty 100

## 2017-05-05 MED ORDER — LOSARTAN POTASSIUM-HCTZ 100-25 MG PO TABS: 1.0000 | ORAL_TABLET | Freq: Every day | ORAL | Status: DC

## 2017-05-05 MED ORDER — FENTANYL CITRATE (PF) 100 MCG/2ML IJ SOLN
100.0000 ug | Freq: Once | INTRAMUSCULAR | Status: AC
  Administered 2017-05-05: 100 ug via INTRAVENOUS
  Filled 2017-05-05: qty 2

## 2017-05-05 MED ORDER — LORAZEPAM 2 MG/ML IJ SOLN: 1.0000 mg | Freq: Four times a day (QID) | INTRAMUSCULAR | Status: DC | PRN

## 2017-05-05 MED ORDER — LORAZEPAM 1 MG PO TABS
0.0000 mg | ORAL_TABLET | Freq: Four times a day (QID) | ORAL | Status: AC
  Administered 2017-05-06 – 2017-05-07 (×3): 1 mg via ORAL
  Filled 2017-05-05 (×4): qty 1

## 2017-05-05 MED ORDER — THIAMINE HCL 100 MG/ML IJ SOLN: 100.0000 mg | Freq: Every day | INTRAMUSCULAR | Status: DC

## 2017-05-05 MED ORDER — VANCOMYCIN HCL IN DEXTROSE 1-5 GM/200ML-% IV SOLN: 1000.0000 mg | Freq: Three times a day (TID) | INTRAVENOUS | Status: DC

## 2017-05-05 MED ORDER — LOSARTAN POTASSIUM 50 MG PO TABS
100.0000 mg | ORAL_TABLET | Freq: Every day | ORAL | Status: DC
  Administered 2017-05-06 – 2017-05-08 (×3): 100 mg via ORAL
  Filled 2017-05-05 (×3): qty 2

## 2017-05-05 MED ORDER — ONDANSETRON HCL 4 MG/2ML IJ SOLN
4.0000 mg | Freq: Once | INTRAMUSCULAR | Status: AC
  Administered 2017-05-05: 4 mg via INTRAVENOUS
  Filled 2017-05-05: qty 2

## 2017-05-05 MED ORDER — FOLIC ACID 1 MG PO TABS
1.0000 mg | ORAL_TABLET | Freq: Every day | ORAL | Status: DC
  Administered 2017-05-06 – 2017-05-08 (×3): 1 mg via ORAL
  Filled 2017-05-05 (×3): qty 1

## 2017-05-05 MED ORDER — VANCOMYCIN HCL 10 G IV SOLR
2500.0000 mg | Freq: Once | INTRAVENOUS | Status: AC
  Administered 2017-05-05: 2500 mg via INTRAVENOUS
  Filled 2017-05-05: qty 2500

## 2017-05-05 MED ORDER — KETAMINE HCL-SODIUM CHLORIDE 100-0.9 MG/10ML-% IV SOSY
0.3000 mg/kg | PREFILLED_SYRINGE | Freq: Once | INTRAVENOUS | Status: AC
  Administered 2017-05-05: 34 mg via INTRAVENOUS
  Filled 2017-05-05: qty 10

## 2017-05-05 MED ORDER — PIPERACILLIN-TAZOBACTAM 3.375 G IVPB
3.3750 g | Freq: Three times a day (TID) | INTRAVENOUS | Status: DC
  Administered 2017-05-05: 3.375 g via INTRAVENOUS
  Filled 2017-05-05: qty 50

## 2017-05-05 MED ORDER — DULOXETINE HCL 30 MG PO CPEP
30.0000 mg | ORAL_CAPSULE | Freq: Every day | ORAL | Status: DC
  Administered 2017-05-06 – 2017-05-08 (×3): 30 mg via ORAL
  Filled 2017-05-05 (×3): qty 1

## 2017-05-05 MED ORDER — HYDROCODONE-ACETAMINOPHEN 5-325 MG PO TABS
1.0000 | ORAL_TABLET | Freq: Four times a day (QID) | ORAL | Status: DC | PRN
  Administered 2017-05-05 – 2017-05-08 (×9): 2 via ORAL
  Filled 2017-05-05 (×9): qty 2

## 2017-05-05 MED ORDER — SODIUM CHLORIDE 0.9 % IV SOLN
INTRAVENOUS | Status: AC
  Administered 2017-05-05: 23:00:00 via INTRAVENOUS

## 2017-05-05 MED ORDER — CITALOPRAM HYDROBROMIDE 20 MG PO TABS
40.0000 mg | ORAL_TABLET | Freq: Every day | ORAL | Status: DC
  Administered 2017-05-06 – 2017-05-08 (×3): 40 mg via ORAL
  Filled 2017-05-05 (×3): qty 2

## 2017-05-05 MED ORDER — HYDROMORPHONE HCL 1 MG/ML IJ SOLN
1.0000 mg | INTRAMUSCULAR | Status: DC | PRN
  Administered 2017-05-05 – 2017-05-08 (×12): 1 mg via INTRAVENOUS
  Filled 2017-05-05 (×12): qty 1

## 2017-05-05 MED ORDER — ONDANSETRON HCL 4 MG/2ML IJ SOLN: 4.0000 mg | Freq: Four times a day (QID) | INTRAMUSCULAR | Status: DC | PRN

## 2017-05-05 MED ORDER — CLINDAMYCIN PHOSPHATE 600 MG/50ML IV SOLN
600.0000 mg | Freq: Once | INTRAVENOUS | Status: AC
  Administered 2017-05-05: 600 mg via INTRAVENOUS
  Filled 2017-05-05: qty 50

## 2017-05-05 MED ORDER — LORAZEPAM 1 MG PO TABS: 0.0000 mg | ORAL_TABLET | Freq: Two times a day (BID) | ORAL | Status: DC

## 2017-05-05 MED ORDER — ACETAMINOPHEN 650 MG RE SUPP: 650.0000 mg | Freq: Four times a day (QID) | RECTAL | Status: DC | PRN

## 2017-05-05 NOTE — ED Notes (Signed)
Attempted to call report, RN will return call 

## 2017-05-05 NOTE — ED Notes (Signed)
Send  Pt  To  [facility]  Now  Per  Dietrich Pates    -  Pt  Sent  To  [facility]  By  [facility]        Report  Phoned  To  Nurse  First

## 2017-05-05 NOTE — ED Notes (Signed)
Doppler study in process at bedside.

## 2017-05-05 NOTE — ED Notes (Signed)
Dr. Kathrynn Humble notified of need for pain medication.

## 2017-05-05 NOTE — ED Notes (Signed)
PT to CT.

## 2017-05-05 NOTE — ED Notes (Signed)
Vascular tech is aware of patient 

## 2017-05-05 NOTE — Progress Notes (Signed)
Received report from ED RN, Autumn.

## 2017-05-05 NOTE — Telephone Encounter (Signed)
Patient Name: [name]  DOB: [birth year]    Initial Comment caller states he feels like he has gout in his right leg and he isn't able to walk on it. He is having severe leg pain.   Nurse Assessment  Nurse: Christel Mormon, RN, Levada Dy Date/Time (Eastern Time): 05/05/2017 9:19:44 AM  Confirm and document reason for call. If symptomatic, describe symptoms. ---Pt is having pain on the side of his right foot. He states his phone got the corner of his foot the other day then a few days later his foot started swelling. Yesterday, he states he had a lot of pain and could hardly walk. The pain seems to be going up to his knee. He believes it could be gout as he had it in his other foot. He then asked if I thought it could be a blood clot.  Does the patient have any new or worsening symptoms? ---Yes  Will a triage be completed? ---Yes  Related visit to physician within the last 2 weeks? ---No  Does the PT have any chronic conditions? (i.e. diabetes, asthma, etc.) ---Yes  List chronic conditions. ---history of gout, HTN,  Is this a behavioral health or substance abuse call? ---No     Guidelines    Guideline Title Affirmed Question Affirmed Notes  Leg Pain Unable to walk    Final Disposition User   Go to ED Now Christel Mormon, [place], [facility] - ED   Disagree/Comply: Comply

## 2017-05-05 NOTE — ED Notes (Addendum)
Pt continues to be in severe pain, reports very little if any relief from Fentanyl or Dilaudid. Dr. Kathrynn Humble notified

## 2017-05-05 NOTE — ED Provider Notes (Signed)
CSN: [id number]     Arrival date & time 05/05/17  1205 History   None    Chief Complaint  Patient presents with  . Foot Swelling   (Consider location/radiation/quality/duration/timing/severity/associated sxs/prior Treatment) Patient c/o right foot pain and right leg pain and swelling that is moving up into the right knee area.  He dropped a phone on his right foot about a week ago and went on vacation and noticed his right calf and lower extremity swelling and becoming painful.  He states he cannot feel his right leg very well because the swelling has become so severe.  He is having pain in right knee as well and cannot bear weight on right lower extremity.   The history is provided by the patient.  Leg Pain  Location:  Leg Injury: yes   Leg location:  R leg Pain details:    Quality:  Throbbing   Radiates to:  R leg   Severity:  Severe   Onset quality:  Gradual   Duration:  1 week   Timing:  Constant Chronicity:  New Dislocation: no   Foreign body present:  No foreign bodies Tetanus status:  Unknown Prior injury to area:  Yes Relieved by:  Nothing Worsened by:  Nothing   Past Medical History:  Diagnosis Date  . ADHD, predominantly inattentive type   . Elevated LFTs 11/19/2013  . Erectile dysfunction   . Generalized anxiety disorder   . Hypertension   . Recurrent cold sores   . Seborrheic dermatitis    Past Surgical History:  Procedure Laterality Date  . VASECTOMY  2014   History reviewed. No pertinent family history. Social History  Substance Use Topics  . Smoking status: Never Smoker  . Smokeless tobacco: Never Used  . Alcohol use No     Comment: no alcohol in several months    Review of Systems  Constitutional: Negative.   HENT: Negative.   Eyes: Negative.   Respiratory: Negative.   Cardiovascular: Positive for leg swelling.  Gastrointestinal: Negative.   Endocrine: Negative.   Genitourinary: Negative.   Musculoskeletal: Positive for arthralgias.   Allergic/Immunologic: Negative.   Neurological: Negative.   Hematological: Negative.   Psychiatric/Behavioral: Negative.     Allergies  Sulfa antibiotics  Home Medications   Prior to Admission medications   Medication Sig Start Date End Date Taking? Authorizing Provider  citalopram (CELEXA) 40 MG tablet TAKE 1 TABLET (40 MG TOTAL) BY MOUTH DAILY. 11/06/16   Venia Carbon, MD  DULoxetine (CYMBALTA) 30 MG capsule Take 1 capsule (30 mg total) by mouth daily. 02/19/17   Venia Carbon, MD  fluocinonide-emollient (LIDEX-E) 0.05 % cream Apply 1 application topically 2 (two) times daily. For poison ivy 05/10/16   Panosh, Standley Brooking, MD  HYDROcodone-acetaminophen (NORCO/VICODIN) 5-325 MG tablet Take 1 tablet by mouth 2 (two) times daily as needed for moderate pain. 04/11/17   Viviana Simpler I, MD  hydrocortisone 2.5 % cream APPLY TOPICALLY 3 (THREE) TIMES DAILY AS NEEDED. 04/18/16   Venia Carbon, MD  indomethacin (INDOCIN) 50 MG capsule Take 1 capsule (50 mg total) by mouth 3 (three) times daily as needed. 03/05/17   Venia Carbon, MD  losartan-hydrochlorothiazide (HYZAAR) 100-25 MG tablet TAKE 1 TABLET BY MOUTH DAILY. 04/08/17   Venia Carbon, MD  sildenafil (REVATIO) 20 MG tablet TAKE 3-5 TABLETS BY MOUTH DAILY AS NEEDED 04/02/17   Venia Carbon, MD  valACYclovir (VALTREX) 1000 MG tablet Two tablets by mouth every 12 hours  x two doses 03/06/16   Venia Carbon, MD   Meds Ordered and Administered this Visit  Medications - No data to display  BP 120/77 (BP Location: Right Arm)   Pulse 88   Temp 98.8 F (37.1 C) (Oral)   Resp 18   SpO2 99%  No data found.   Physical Exam  Constitutional: He appears well-developed and well-nourished.  HENT:  Head: Normocephalic and atraumatic.  Eyes: Pupils are equal, round, and reactive to light. Conjunctivae and EOM are normal.  Neck: Normal range of motion. Neck supple.  Cardiovascular: Normal rate, regular rhythm and normal heart  sounds.   Pulmonary/Chest: Effort normal and breath sounds normal.  Musculoskeletal:  Positive Homan's right lower extremity.  Right foot and toe with pain and swelling, right foot and right lower extremity with pain.  TTP right knee.  Right lower extremity has 6 inches or more swelling compared to left lower extremity.  Nursing note and vitals reviewed.   Urgent Care Course     Procedures (including critical care time)  Labs Review Labs Reviewed - No data to display  Imaging Review No results found.   Visual Acuity Review  Right Eye Distance:   Left Eye Distance:   Bilateral Distance:    Right Eye Near:   Left Eye Near:    Bilateral Near:         MDM   1. Pain and swelling of right lower leg    Concerned may have DVT in right lower extremity and will need to be transferred to ED for higher level of care.      Lysbeth Penner,  05/05/17 1312

## 2017-05-05 NOTE — ED Notes (Signed)
Dr. Kathrynn Humble at bedside for fluid aspiration from R knee. Pt denies pain post Ketamine.  Pt and wife gave verbal consent for sedation and procedure.  Initially after Ketamine admin pt became diaphoretic and anxious.  Minimal nystagmus noted.  Pt placed  On 2L [place] for comfort, pt on CCM

## 2017-05-05 NOTE — ED Notes (Signed)
Pt's right leg from above knee, positive pedal and posterior tibial pulses, reddened area on lateral side of right foot marked with marked.

## 2017-05-05 NOTE — H&P (Signed)
History and Physical    Mark Burns MOQ:[id number] DOB: [birth year] DOA: 05/05/2017  PCP: Venia Carbon, MD   Patient coming from: Home  Chief Complaint: Right leg pain, swelling, redness   HPI: Mark Burns is a 36 y.o. male with medical history significant for hypertension, depression, anxiety, and daily alcohol use, now presenting to the emergency department for evaluation of pain, redness, and swelling involving the right lower leg. Patient reports that he was in his usual state of health until 6 days ago when he dropped a cell phone, kicked it with his right foot as it felt to keep it from hitting the ground, and experienced immediate pain at the dorsum of the right foot. Since that time, redness and swelling has developed and the pain has increased significantly. He denies fevers or chills, and denies drainage or open wound, but notes progression of the pain and swelling from the foot up to, and involving the right knee. Reports a history of gout, denies history of DVT. Recently had a long car ride from [place]. Denies chest pain or palpitations and denies dyspnea or cough. Never experienced similar symptoms previously. Has been taking his home Norco and NSAID with no significant relief.    ED Course: Upon arrival to the ED, patient is found to be afebrile, saturating well on room air, and with vitals otherwise stable. Chemistry panels unremarkable, CBC is notable for a leukocytosis to 12,900, serum CK within the normal limits, and elevated sedimentation rate and CRP are noted to 31 and 6.8, respectively. Right lower extremity venous Doppler is negative for DVT, but notable for enlarged inguinal lymph nodes. CT of the right leg is obtained and notable for soft tissue edema of the right lower extremity, most prominent at the lateral foot and ankle, extending up to the distal femur, and without any abscess or gas identified. No osseous abnormality is noted on the CT, by day moderate knee effusion  is present. Blood cultures were obtained in the emergency department, 1 L of normal saline was given, and the patient was started on empiric vancomycin, Zosyn, and clindamycin. He was treated with fentanyl 100 g 2, and 1 mg IV Dilaudid, but without any significant relief in his pain. ED physician aspirated the right knee and fluid will be sent for studies. Patient remained hemodynamically stable in the ED and in no apparent respiratory distress, and he will be observed on the medical surgical unit for ongoing evaluation and management of right lower extremity pain, swelling, and redness, secondary to cellulitis.  Review of Systems:  All other systems reviewed and apart from HPI, are negative.  Past Medical History:  Diagnosis Date  . ADHD, predominantly inattentive type   . Elevated LFTs 11/19/2013  . Erectile dysfunction   . Generalized anxiety disorder   . Hypertension   . Recurrent cold sores   . Seborrheic dermatitis     Past Surgical History:  Procedure Laterality Date  . VASECTOMY  2014     reports that he has never smoked. He has never used smokeless tobacco. He reports that he does not drink alcohol or use drugs.  Allergies  Allergen Reactions  . Sulfa Antibiotics Hives    History reviewed. No pertinent family history.   Prior to Admission medications   Medication Sig Start Date End Date Taking? Authorizing Provider  citalopram (CELEXA) 40 MG tablet TAKE 1 TABLET (40 MG TOTAL) BY MOUTH DAILY. 11/06/16  Yes Venia Carbon, MD  DULoxetine (CYMBALTA) 30 MG capsule  Take 1 capsule (30 mg total) by mouth daily. 02/19/17  Yes Venia Carbon, MD  HYDROcodone-acetaminophen (NORCO/VICODIN) 5-325 MG tablet Take 1 tablet by mouth 2 (two) times daily as needed for moderate pain. 04/11/17  Yes Viviana Simpler I, MD  hydrocortisone 2.5 % cream APPLY TOPICALLY 3 (THREE) TIMES DAILY AS NEEDED. Patient taking differently: Apply 1 application three times a day to affected site as needed  for itching 04/18/16  Yes Venia Carbon, MD  indomethacin (INDOCIN) 50 MG capsule Take 1 capsule (50 mg total) by mouth 3 (three) times daily as needed. Patient taking differently: Take 50 mg by mouth 3 (three) times daily as needed (for gout pain/flares).  03/05/17  Yes Venia Carbon, MD  losartan-hydrochlorothiazide (HYZAAR) 100-25 MG tablet TAKE 1 TABLET BY MOUTH DAILY. 04/08/17  Yes Venia Carbon, MD    Physical Exam: Vitals:   05/05/17 1830 05/05/17 1845 05/05/17 2000 05/05/17 2015  BP: 126/82 123/78 (!) 140/95 (!) 149/92  Pulse: 84 83 87 95  Resp:      Temp:      TempSrc:      SpO2: 96% 97% 100% 100%  Weight:      Height:          Constitutional: No respiratory distress. In obvious discomfort.  Eyes: PERTLA, lids and conjunctivae normal ENMT: Mucous membranes are moist. Posterior pharynx clear of any exudate or lesions.   Neck: normal, supple, no masses, no thyromegaly Respiratory: clear to auscultation bilaterally, no wheezing, no crackles. Normal respiratory effort.    Cardiovascular: S1 & S2 heard, regular rate and rhythm. No significant JVD. Abdomen: No distension, no tenderness, no masses palpated. Bowel sounds normal.  Musculoskeletal: no clubbing / cyanosis. Right knee swollen, without significant heat or erythema.   Skin: Edema and faint erythema from dorsolateral right foot up to knee without drainage or fluctuance. Skin is otherwise warm, dry, well-perfused. Neurologic: CN 2-12 grossly intact. Sensation intact, DTR normal. Strength 5/5 in all 4 limbs.  Psychiatric: Alert and oriented x 3. Pleasant and cooperative.     Labs on Admission: I have personally reviewed following labs and imaging studies  CBC:  Recent Labs Lab 05/05/17 1339  WBC 12.9*  NEUTROABS 8.2*  HGB 13.4  HCT 38.1*  MCV 86.2  PLT 960   Basic Metabolic Panel:  Recent Labs Lab 05/05/17 1339  NA 135  K 4.3  CL 98*  CO2 26  GLUCOSE 91  BUN 16  CREATININE 0.89  CALCIUM  9.4   GFR: Estimated Creatinine Clearance: 152.2 mL/min (by C-G formula based on SCr of 0.89 mg/dL). Liver Function Tests: No results for input(s): AST, ALT, ALKPHOS, BILITOT, PROT, ALBUMIN in the last 168 hours. No results for input(s): LIPASE, AMYLASE in the last 168 hours. No results for input(s): AMMONIA in the last 168 hours. Coagulation Profile: No results for input(s): INR, PROTIME in the last 168 hours. Cardiac Enzymes:  Recent Labs Lab 05/05/17 1610  CKTOTAL 91   BNP (last 3 results) No results for input(s): PROBNP in the last 8760 hours. HbA1C: No results for input(s): HGBA1C in the last 72 hours. CBG: No results for input(s): GLUCAP in the last 168 hours. Lipid Profile: No results for input(s): CHOL, HDL, LDLCALC, TRIG, CHOLHDL, LDLDIRECT in the last 72 hours. Thyroid Function Tests: No results for input(s): TSH, T4TOTAL, FREET4, T3FREE, THYROIDAB in the last 72 hours. Anemia Panel: No results for input(s): VITAMINB12, FOLATE, FERRITIN, TIBC, IRON, RETICCTPCT in the last 72 hours.  Urine analysis:    Component Value Date/Time   COLORURINE YELLOW 01/10/2012 1455   APPEARANCEUR CLEAR 01/10/2012 1455   LABSPEC 1.024 01/10/2012 1455   PHURINE 5.5 01/10/2012 1455   GLUCOSEU NEGATIVE 01/10/2012 1455   HGBUR MODERATE (A) 01/10/2012 1455   BILIRUBINUR NEGATIVE 01/10/2012 1455   KETONESUR NEGATIVE 01/10/2012 1455   PROTEINUR 30 (A) 01/10/2012 1455   UROBILINOGEN 0.2 01/10/2012 1455   NITRITE NEGATIVE 01/10/2012 1455   LEUKOCYTESUR NEGATIVE 01/10/2012 1455   Sepsis Labs: @LABRCNTIP (procalcitonin:4,lacticidven:4) )No results found for this or any previous visit (from the past 240 hour(s)).   Radiological Exams on Admission: Ct Foot Right W Contrast  Result Date: 05/05/2017 CLINICAL DATA:  Right lower extremity infection. Leg swelling from the foot to distal femur. Patient reports dropping phone on foot 6 days ago. EXAM: CT OF THE LOWER RIGHT EXTREMITY WITH CONTRAST  TECHNIQUE: Multidetector CT imaging of the lower right extremity was performed according to the standard protocol following intravenous contrast administration. The right femur, tibia/fibula, and foot were image. COMPARISON:  None. CONTRAST:  18m ISOVUE-300 IOPAMIDOL (ISOVUE-300) INJECTION 61% FINDINGS: Femur: Osseous structures: No femur fracture. No bony destructive change or periosteal reaction. Hip joint space is maintained. No hip joint effusion. There is a small to moderate knee joint effusion. There is some high-density debris within the knee joint. Soft tissues: Anterior subcutaneous soft tissue edema about the distal femur/thigh. No fluid collection. No soft tissue air. Streak artifact from the external metallic density in the mid femur. No intramuscular fluid collection. Normal fatty striations persist. No gross filling defects within the visualized venous structures to suggest DVT. Tibia/fibula: Osseous structures: Moderate knee joint effusion with some high-density intra-articular debris in the knee joint. Tibia and fibula are intact without fracture. No bony destructive change or periosteal reaction. Minimal osseous 6 gross shins from the medial aspect of the distal tibia suggests prior injury. Soft tissues: Anterior subcutaneous soft tissue edema about the lower leg. No focal fluid collection or abscess. No soft tissue air. No intramuscular collection. Normal fatty muscle striations persists. No gross filling defects within the visualized venous structures to suggest DVT. Foot: Osseous structures: No acute fracture of the foot or ankle. No bony destructive change. There is a small ankle joint effusion. Soft tissues: Skin thickening and soft tissue edema about the foot and ankle, most prominent laterally. No focal fluid collection. No soft tissue air. IMPRESSION: 1. Soft tissue edema of the right lower extremity, most prominent about the lateral foot and ankle, extending up to the distal femur. This  may be secondary to cellulitis/infection. No abscess or soft tissue air. 2. No acute osseous abnormality of the right lower extremity. 3. Moderate knee joint effusion containing high-density material, possible small osteochondral bodies. Small tibial talar joint effusion. Electronically Signed   By: MJeb LeveringM.D.   On: 05/05/2017 18:43   Ct Femur Right W Contrast  Result Date: 05/05/2017 CLINICAL DATA:  Right lower extremity infection. Leg swelling from the foot to distal femur. Patient reports dropping phone on foot 6 days ago. EXAM: CT OF THE LOWER RIGHT EXTREMITY WITH CONTRAST TECHNIQUE: Multidetector CT imaging of the lower right extremity was performed according to the standard protocol following intravenous contrast administration. The right femur, tibia/fibula, and foot were image. COMPARISON:  None. CONTRAST:  1071mISOVUE-300 IOPAMIDOL (ISOVUE-300) INJECTION 61% FINDINGS: Femur: Osseous structures: No femur fracture. No bony destructive change or periosteal reaction. Hip joint space is maintained. No hip joint effusion. There is a small  to moderate knee joint effusion. There is some high-density debris within the knee joint. Soft tissues: Anterior subcutaneous soft tissue edema about the distal femur/thigh. No fluid collection. No soft tissue air. Streak artifact from the external metallic density in the mid femur. No intramuscular fluid collection. Normal fatty striations persist. No gross filling defects within the visualized venous structures to suggest DVT. Tibia/fibula: Osseous structures: Moderate knee joint effusion with some high-density intra-articular debris in the knee joint. Tibia and fibula are intact without fracture. No bony destructive change or periosteal reaction. Minimal osseous 6 gross shins from the medial aspect of the distal tibia suggests prior injury. Soft tissues: Anterior subcutaneous soft tissue edema about the lower leg. No focal fluid collection or abscess. No soft  tissue air. No intramuscular collection. Normal fatty muscle striations persists. No gross filling defects within the visualized venous structures to suggest DVT. Foot: Osseous structures: No acute fracture of the foot or ankle. No bony destructive change. There is a small ankle joint effusion. Soft tissues: Skin thickening and soft tissue edema about the foot and ankle, most prominent laterally. No focal fluid collection. No soft tissue air. IMPRESSION: 1. Soft tissue edema of the right lower extremity, most prominent about the lateral foot and ankle, extending up to the distal femur. This may be secondary to cellulitis/infection. No abscess or soft tissue air. 2. No acute osseous abnormality of the right lower extremity. 3. Moderate knee joint effusion containing high-density material, possible small osteochondral bodies. Small tibial talar joint effusion. Electronically Signed   By: Jeb Levering M.D.   On: 05/05/2017 18:43   Ct Tibia Fibula Right W Contrast  Result Date: 05/05/2017 CLINICAL DATA:  Right lower extremity infection. Leg swelling from the foot to distal femur. Patient reports dropping phone on foot 6 days ago. EXAM: CT OF THE LOWER RIGHT EXTREMITY WITH CONTRAST TECHNIQUE: Multidetector CT imaging of the lower right extremity was performed according to the standard protocol following intravenous contrast administration. The right femur, tibia/fibula, and foot were image. COMPARISON:  None. CONTRAST:  139m ISOVUE-300 IOPAMIDOL (ISOVUE-300) INJECTION 61% FINDINGS: Femur: Osseous structures: No femur fracture. No bony destructive change or periosteal reaction. Hip joint space is maintained. No hip joint effusion. There is a small to moderate knee joint effusion. There is some high-density debris within the knee joint. Soft tissues: Anterior subcutaneous soft tissue edema about the distal femur/thigh. No fluid collection. No soft tissue air. Streak artifact from the external metallic density in  the mid femur. No intramuscular fluid collection. Normal fatty striations persist. No gross filling defects within the visualized venous structures to suggest DVT. Tibia/fibula: Osseous structures: Moderate knee joint effusion with some high-density intra-articular debris in the knee joint. Tibia and fibula are intact without fracture. No bony destructive change or periosteal reaction. Minimal osseous 6 gross shins from the medial aspect of the distal tibia suggests prior injury. Soft tissues: Anterior subcutaneous soft tissue edema about the lower leg. No focal fluid collection or abscess. No soft tissue air. No intramuscular collection. Normal fatty muscle striations persists. No gross filling defects within the visualized venous structures to suggest DVT. Foot: Osseous structures: No acute fracture of the foot or ankle. No bony destructive change. There is a small ankle joint effusion. Soft tissues: Skin thickening and soft tissue edema about the foot and ankle, most prominent laterally. No focal fluid collection. No soft tissue air. IMPRESSION: 1. Soft tissue edema of the right lower extremity, most prominent about the lateral foot and ankle,  extending up to the distal femur. This may be secondary to cellulitis/infection. No abscess or soft tissue air. 2. No acute osseous abnormality of the right lower extremity. 3. Moderate knee joint effusion containing high-density material, possible small osteochondral bodies. Small tibial talar joint effusion. Electronically Signed   By: Jeb Levering M.D.   On: 05/05/2017 18:43    EKG: Not performed.   Assessment/Plan  1. Cellulitis, right leg  - Pt presents with progressive swelling, redness, and severe pain involving right foot, ankle, and knee  - No fevers, no tachycardia, leukocytosis to 12,900 and elevated ESR and CRP  - Venous US negative for underlying DVT, notable for inguinal adenopathy  - CT with soft-tissue edema, no abscess or gas  - Blood  cultures obtained in ED and empiric vancomycin, Zosyn, and clindamycin administered - Plan to continue empiric IV antibiotics with Rocephin for moderate non-purulent cellulitis  - Trend WBC and inflammatory markers; transition to oral abx as appropriate   2. Right knee effusion  - ~40 cc fluid aspirated in ED and sent for studies, including crystal analysis, gram stain, and culture  - Continue empiric abx, pain-control    3. Hypertension - BP at goal  - Continue losartan-HCTZ   4. Depression, anxiety  - Stable - Continue Celexa and Cymbalta  5. Daily alcohol use - No signs of withdrawal on admission - Monitor with CIWA, prn Ativan - Supplement vitamins and minerals   6. Chronic back pain  - No back pain complaints, but presents with severe right leg pain  - Managed at home with prn Norco, NSAIDs - Will allow IV analgesia now  for acute pain-control   DVT prophylaxis: sq Lovenox Code Status: Full  Family Communication: Wife updated at bedside Disposition Plan: Observe on med-surg Consults called: None Admission status: Observation    Vianne Bulls, MD [facility] Pager [phone]  If 7PM-7AM, please contact night-coverage [email or web address] Password [facility]  05/05/2017, 8:37 PM

## 2017-05-05 NOTE — ED Triage Notes (Signed)
Pt here from [facility] for eval of right leg and foot swelling and pain after dropping phone on foot

## 2017-05-05 NOTE — Progress Notes (Signed)
VASCULAR LAB PRELIMINARY  PRELIMINARY  PRELIMINARY  PRELIMINARY  Right lower extremity venous duplex completed.    Preliminary report:  There is no DVT or SVT noted in the right lower extremity.  Multiple enlarged inguinal lymph nodes noted bilaterally.  Gave report to [place], [place]  [place], Denell Cothern, RVT 05/05/2017, 3:24 PM

## 2017-05-05 NOTE — ED Provider Notes (Signed)
[place] DEPT Provider Note   CSN: [id number] Arrival date & time: 05/05/17  1305     History   Chief Complaint Chief Complaint  Patient presents with  . Leg Swelling    HPI Greogory Cornette is a 36 y.o. male.  Patient with h/o anxiety and back pain -- presents with c/o pain and swelling in R lower extremity. No fevers or vomiting. Pain is severe. One week ago patient dropped a cell phone on his right foot and had a sore area. He was vacationing in [place] and was swimming in the ocean. It was sore for several days. During car ride home yesterday he had acute worsening of pain and swelling extending up his leg to distal thigh. Pain is worse with palpation and movement. No treatments PTA. No h/o DVT. He is a daily alcohol user.       Past Medical History:  Diagnosis Date  . ADHD, predominantly inattentive type   . Elevated LFTs 11/19/2013  . Erectile dysfunction   . Generalized anxiety disorder   . Hypertension   . Recurrent cold sores   . Seborrheic dermatitis     Patient Active Problem List   Diagnosis Date Noted  . Preventative health care 03/19/2016  . Chronic back pain 03/19/2016  . Recurrent cold sores   . Seborrheic dermatitis   . ADHD, predominantly inattentive type   . Generalized anxiety disorder   . Elevated LFTs 11/19/2013  . Erectile dysfunction   . Hypertension     Past Surgical History:  Procedure Laterality Date  . VASECTOMY  2014       Home Medications    Prior to Admission medications   Medication Sig Start Date End Date Taking? Authorizing Provider  citalopram (CELEXA) 40 MG tablet TAKE 1 TABLET (40 MG TOTAL) BY MOUTH DAILY. 11/06/16   Venia Carbon, MD  DULoxetine (CYMBALTA) 30 MG capsule Take 1 capsule (30 mg total) by mouth daily. 02/19/17   Venia Carbon, MD  fluocinonide-emollient (LIDEX-E) 0.05 % cream Apply 1 application topically 2 (two) times daily. For poison ivy 05/10/16   Panosh, Standley Brooking, MD  HYDROcodone-acetaminophen  (NORCO/VICODIN) 5-325 MG tablet Take 1 tablet by mouth 2 (two) times daily as needed for moderate pain. 04/11/17   Viviana Simpler I, MD  hydrocortisone 2.5 % cream APPLY TOPICALLY 3 (THREE) TIMES DAILY AS NEEDED. 04/18/16   Venia Carbon, MD  indomethacin (INDOCIN) 50 MG capsule Take 1 capsule (50 mg total) by mouth 3 (three) times daily as needed. 03/05/17   Venia Carbon, MD  losartan-hydrochlorothiazide (HYZAAR) 100-25 MG tablet TAKE 1 TABLET BY MOUTH DAILY. 04/08/17   Venia Carbon, MD  sildenafil (REVATIO) 20 MG tablet TAKE 3-5 TABLETS BY MOUTH DAILY AS NEEDED 04/02/17   Venia Carbon, MD  valACYclovir (VALTREX) 1000 MG tablet Two tablets by mouth every 12 hours x two doses 03/06/16   Venia Carbon, MD    Family History History reviewed. No pertinent family history.  Social History Social History  Substance Use Topics  . Smoking status: Never Smoker  . Smokeless tobacco: Never Used  . Alcohol use No     Comment: no alcohol in several months     Allergies   Sulfa antibiotics   Review of Systems Review of Systems  Constitutional: Negative for fever.  HENT: Negative for rhinorrhea and sore throat.   Eyes: Negative for redness.  Respiratory: Negative for cough.   Cardiovascular: Positive for leg swelling. Negative for  chest pain.  Gastrointestinal: Negative for abdominal pain, diarrhea, nausea and vomiting.  Genitourinary: Negative for dysuria.  Musculoskeletal: Positive for gait problem and myalgias. Negative for arthralgias and joint swelling.  Skin: Positive for color change. Negative for rash.  Neurological: Negative for headaches.     Physical Exam Updated Vital Signs BP (!) 129/100 (BP Location: Right Arm)   Pulse 94   Temp 98.3 F (36.8 C) (Oral)   Resp 18   SpO2 99%   Physical Exam  Constitutional: He appears well-developed and well-nourished.  HENT:  Head: Normocephalic and atraumatic.  Mouth/Throat: Oropharynx is clear and moist.  Eyes:  Conjunctivae are normal. Right eye exhibits no discharge. Left eye exhibits no discharge.  Neck: Normal range of motion. Neck supple.  Cardiovascular: Normal rate, regular rhythm and normal heart sounds.   Pulses:      Dorsalis pedis pulses are 2+ on the right side, and 2+ on the left side.       Posterior tibial pulses are 2+ on the right side, and 2+ on the left side.  Pulmonary/Chest: Effort normal and breath sounds normal.  Abdominal: Soft. There is no tenderness.  Musculoskeletal: He exhibits edema and tenderness.       Right hip: Normal.       Right knee: He exhibits decreased range of motion and swelling. Tenderness found.       Right ankle: Tenderness.       Right upper leg: He exhibits tenderness.       Right lower leg: He exhibits tenderness, swelling and edema.       Right foot: There is tenderness and swelling.       Feet:  Neurological: He is alert.  Skin: Skin is warm and dry.  Psychiatric: He has a normal mood and affect.  Nursing note and vitals reviewed.    ED Treatments / Results  Labs (all labs ordered are listed, but only abnormal results are displayed) Labs Reviewed  CBC WITH DIFFERENTIAL/PLATELET - Abnormal; Notable for the following:       Result Value   WBC 12.9 (*)    HCT 38.1 (*)    Neutro Abs 8.2 (*)    All other components within normal limits  BASIC METABOLIC PANEL - Abnormal; Notable for the following:    Chloride 98 (*)    All other components within normal limits  I-STAT CG4 LACTIC ACID, ED - Abnormal; Notable for the following:    Lactic Acid, Venous 2.18 (*)    All other components within normal limits  CULTURE, BLOOD (ROUTINE X 2)  CULTURE, BLOOD (ROUTINE X 2)  C-REACTIVE PROTEIN  SEDIMENTATION RATE  CK    EKG  EKG Interpretation None       Radiology No results found.  Procedures Procedures (including critical care time)  Medications Ordered in ED Medications  clindamycin (CLEOCIN) IVPB 600 mg (not administered)    vancomycin (VANCOCIN) 2,500 mg in sodium chloride 0.9 % 500 mL IVPB (not administered)  vancomycin (VANCOCIN) IVPB 1000 mg/200 mL premix (not administered)  fentaNYL (SUBLIMAZE) injection 100 mcg (not administered)  piperacillin-tazobactam (ZOSYN) IVPB 3.375 g (not administered)  HYDROmorphone (DILAUDID) injection 1 mg (1 mg Intravenous Given 05/05/17 1356)  ondansetron (ZOFRAN) injection 4 mg (4 mg Intravenous Given 05/05/17 1356)  sodium chloride 0.9 % bolus 1,000 mL (0 mLs Intravenous Stopped 05/05/17 1522)  HYDROmorphone (DILAUDID) injection 1 mg (1 mg Intravenous Given 05/05/17 1450)     Initial Impression / Assessment and Plan /  ED Course  I have reviewed the triage vital signs and the nursing notes.  Pertinent labs & imaging results that were available during my care of the patient were reviewed by me and considered in my medical decision making (see chart for details).     Patient seen and examined. Work-up initiated. Medications ordered. Pt is in a lot of pain. He has excellent DP pulse. Will need DVT study.   Patient seen by Dr. Ellender Hose.   Vital signs reviewed and are as follows: BP (!) 129/100 (BP Location: Right Arm)   Pulse 94   Temp 98.3 F (36.8 C) (Oral)   Resp 18   SpO2 99%   DVT study neg --> abx ordered.   CT pending.   4:13 PM Pt stable, exam stable. Awaiting CT. Dr. Kathrynn Humble aware of patient and need for admission after CT results.   Final Clinical Impressions(s) / ED Diagnoses   Final diagnoses:  Cellulitis  Infection   Pending work-up.   New Prescriptions New Prescriptions   No medications on file     Carlisle Cater, Hershal Coria 05/05/17 1618    Duffy Bruce, MD 05/06/17 1438

## 2017-05-05 NOTE — Progress Notes (Addendum)
Pharmacy Antibiotic Note  Mark Burns is a 36 y.o. male admitted on 05/05/2017 with cellulitis.    Plan: Zosyn iv 3.375 gm iv q8h Vanc 2500 mg x 1 then 1g q8  Monitor renal fx, cx, vt prn  Height: 6\' 2"  (188 cm) Weight: 250 lb (113.4 kg) IBW/kg (Calculated) : 82.2  Temp (24hrs), Avg:98.6 F (37 C), Min:98.3 F (36.8 C), Max:98.8 F (37.1 C)   Recent Labs Lab 05/05/17 1339 05/05/17 1353  WBC 12.9*  --   CREATININE 0.89  --   LATICACIDVEN  --  2.18*    Estimated Creatinine Clearance: 152.2 mL/min (by C-G formula based on SCr of 0.89 mg/dL).    Allergies  Allergen Reactions  . Sulfa Antibiotics Hives   Levester Fresh, PharmD, BCPS, BCCCP Clinical Pharmacist Clinical phone for 05/05/2017 from 7a-3:30p: [phone] If after 3:30p, please call main pharmacy at: [phone] 05/05/2017 3:59 PM

## 2017-05-05 NOTE — ED Provider Notes (Addendum)
  Physical Exam  BP (!) 126/49   Pulse 79   Temp 98.3 F (36.8 C) (Oral)   Resp 18   Ht 6\' 2"  (1.88 m)   Wt 113.4 kg (250 lb)   SpO2 98%   BMI 32.10 kg/m   Physical Exam  ED Course  Procedures  MDM  Assuming care of patient from [facility]   Patient in the ED for RLE swelling. Pt dropped a cell phone on his foot few days back and drove in from [facility] Workup thus far shows neg DVT workup.  Pt is neurovascularly intact.  Concerning findings are as following: pain out of proportion Important pending results are: CT leg. Eval for deep space infection  Plan is to admit   Patient had no complains, no concerns from the nursing side. Will continue to monitor.   Varney Biles, MD 05/05/17 1607  8:29 PM CT scan was reassuring. There is likely cellulitis. Pt has hx of gout. Knee pain was severe. We performed arthrocentesis.    Procedures .Joint Aspiration/Arthrocentesis Date/Time: 05/05/2017 8:28 PM Performed by: Valda Lamb Authorized by: Varney Biles   Consent:    Consent obtained:  Verbal   Consent given by:  Patient   Risks discussed:  Infection, pain, bleeding and incomplete drainage   Alternatives discussed:  No treatment Location:    Location:  Knee   Knee:  R knee Anesthesia (see MAR for exact dosages):    Anesthesia method:  Local infiltration and topical application   Local anesthetic:  Lidocaine 2% w/o epi Procedure details:    Preparation: Patient was prepped and draped in usual sterile fashion     Needle gauge:  18 G   Ultrasound guidance: no     Approach:  Lateral   Aspirate amount:  55   Aspirate characteristics:  Yellow   Steroid injected: no     Specimen collected: yes   Post-procedure details:    Dressing:  Adhesive bandage   Patient tolerance of procedure:  Tolerated well, no immediate complications    Varney Biles, MD 05/05/17 2030

## 2017-05-05 NOTE — ED Triage Notes (Addendum)
Pt    Reports    Pain  r   Foot   Dropped  A     Cell  Phone    On top  Of  r     Foot  The  Foot  Is    Swollen          And  He  Has       Severe  Pain  In  The  Foot       r  Calf  Is    Swollen

## 2017-05-06 DIAGNOSIS — M25461 Effusion, right knee: Secondary | ICD-10-CM | POA: Diagnosis not present

## 2017-05-06 DIAGNOSIS — F1010 Alcohol abuse, uncomplicated: Secondary | ICD-10-CM | POA: Diagnosis not present

## 2017-05-06 DIAGNOSIS — I10 Essential (primary) hypertension: Secondary | ICD-10-CM | POA: Diagnosis not present

## 2017-05-06 DIAGNOSIS — L03115 Cellulitis of right lower limb: Secondary | ICD-10-CM | POA: Diagnosis not present

## 2017-05-06 LAB — CBC WITH DIFFERENTIAL/PLATELET
BASOS PCT: 0 %
Basophils Absolute: 0 10*3/uL (ref 0.0–0.1)
EOS PCT: 1 %
Eosinophils Absolute: 0.1 10*3/uL (ref 0.0–0.7)
HEMATOCRIT: 30.9 % — AB (ref 39.0–52.0)
Hemoglobin: 10.5 g/dL — ABNORMAL LOW (ref 13.0–17.0)
LYMPHS ABS: 3.2 10*3/uL (ref 0.7–4.0)
Lymphocytes Relative: 29 %
MCH: 29.7 pg (ref 26.0–34.0)
MCHC: 34 g/dL (ref 30.0–36.0)
MCV: 87.3 fL (ref 78.0–100.0)
MONO ABS: 0.9 10*3/uL (ref 0.1–1.0)
Monocytes Relative: 8 %
NEUTROS ABS: 7 10*3/uL (ref 1.7–7.7)
NEUTROS PCT: 62 %
Platelets: 210 10*3/uL (ref 150–400)
RBC: 3.54 MIL/uL — ABNORMAL LOW (ref 4.22–5.81)
RDW: 12.5 % (ref 11.5–15.5)
WBC: 11.2 10*3/uL — ABNORMAL HIGH (ref 4.0–10.5)

## 2017-05-06 LAB — SEDIMENTATION RATE: Sed Rate: 48 mm/hr — ABNORMAL HIGH (ref 0–16)

## 2017-05-06 LAB — BASIC METABOLIC PANEL
Anion gap: 7 (ref 5–15)
BUN: 9 mg/dL (ref 6–20)
CALCIUM: 8.5 mg/dL — AB (ref 8.9–10.3)
CHLORIDE: 100 mmol/L — AB (ref 101–111)
CO2: 30 mmol/L (ref 22–32)
CREATININE: 0.97 mg/dL (ref 0.61–1.24)
GFR calc Af Amer: >60 mL/min (ref >60)
GFR calc non Af Amer: >60 mL/min (ref >60)
GLUCOSE: 127 mg/dL — AB (ref 65–99)
Potassium: 3.4 mmol/L — ABNORMAL LOW (ref 3.5–5.1)
Sodium: 137 mmol/L (ref 135–145)

## 2017-05-06 LAB — LACTIC ACID, PLASMA: LACTIC ACID, VENOUS: 1 mmol/L (ref 0.5–1.9)

## 2017-05-06 LAB — C-REACTIVE PROTEIN: CRP: 9.5 mg/dL — ABNORMAL HIGH (ref <1.0)

## 2017-05-06 LAB — HIV ANTIBODY (ROUTINE TESTING W REFLEX): HIV Screen 4th Generation wRfx: NONREACTIVE

## 2017-05-06 MED ORDER — INDOMETHACIN 50 MG PO CAPS
50.0000 mg | ORAL_CAPSULE | Freq: Three times a day (TID) | ORAL | Status: DC | PRN
  Administered 2017-05-06 – 2017-05-08 (×3): 50 mg via ORAL
  Filled 2017-05-06 (×5): qty 1

## 2017-05-06 NOTE — Telephone Encounter (Signed)
Seen at ER.  Routed to PCP as FYI.

## 2017-05-06 NOTE — Progress Notes (Signed)
NURSING PROGRESS NOTE  Mark Burns [id number] Admission Data: 05/06/2017 1:12 AM Attending Provider: Vianne Bulls, MD QAS:[id number], Theophilus Kinds, MD Code Status: Full  Allergies:  Sulfa antibiotics Past Medical History:   has a past medical history of ADHD, predominantly inattentive type; Elevated LFTs (11/19/2013); Erectile dysfunction; Generalized anxiety disorder; Hypertension; Recurrent cold sores; and Seborrheic dermatitis. Past Surgical History:   has a past surgical history that includes Vasectomy (2014). Social History:   reports that he has never smoked. He has never used smokeless tobacco. He reports that he drinks alcohol. He reports that he does not use drugs.  Mark Burns is a 36 y.o. male patient admitted from ED:   Last Documented Vital Signs: Blood pressure 134/73, pulse 94, temperature 99.1 F (37.3 C), resp. rate 18, height 6\' 2"  (1.88 m), weight 118.5 kg (261 lb 3.9 oz), SpO2 98 %.  IV Fluids:  IV in place, occlusive dsg intact without redness, IV cath forarm left, condition patent and no redness normal saline.   Skin: Appropriate for ethnicity and intact with cellulitis on right leg and psoriasis on upper buttocks in gluteal fold.  Patient/Family orientated to room. Information packet given to patient/family. Admission inpatient armband information verified with patient/family to include name and date of birth and placed on patient arm. Side rails up x 2, fall assessment and education completed with patient/family. Patient/family able to verbalize understanding of risk associated with falls and verbalized understanding to call for assistance before getting out of bed. Call light within reach. Patient/family able to voice and demonstrate understanding of unit orientation instructions.    Will continue to evaluate and treat per MD orders.  Clydell Hakim RN, BSN

## 2017-05-06 NOTE — Progress Notes (Signed)
PROGRESS NOTE    Mark Burns  IDP:[id number] DOB: [birth year] DOA: 05/05/2017 PCP: Venia Carbon, MD    Brief Narrative: Mark Burns is a 36 y.o. male with medical history significant for hypertension, depression, anxiety, and daily alcohol use, now presenting to the emergency department for evaluation of pain, redness, and swelling involving the right lower leg. Admitted for right leg cellulitis and right knee effusion.  He was found to have right knee effusion, underwent fluid aspiration of the right knee, analysis sent.   Assessment & Plan:   Principal Problem:   Cellulitis of right leg Active Problems:   Hypertension   Generalized anxiety disorder   Chronic back pain   Alcohol abuse, daily use   Effusion of right knee joint   Cellulitis of the right lower extremity: Appears to be improving.  Currently on IV rocephin 2 g every 24 hours.  venous duplex negative for DVT.  CT shows soft tissue edema,  Blood cultures negative so far.  Afebrile, leukocytosis improving.    Right knee effusion, underwent fluid aspiration and sent for analysis. Gram stain does not show any bacteria.  No crystals seen. Wbc count elevated in 4000's. Cultures sent and pending.  Resume rocephin and pain control.    Depression and anxiety:  Resume homemeds.    Alcohol dependence:  No signs of withdrawal.  Resume CIWA   Hypertension: controlled.  Resume losartan.      DVT prophylaxis: ( lovenox) Code Status: (Full) Family Communication: (none at bedside.) Disposition Plan: pending resolution of the right leg cellulitis.    Consultants:   None.    Procedures: fluid aspiration from R knee.   Antimicrobials: rocephin.    Subjective: Pain is well controlled.   Objective: Vitals:   05/05/17 2100 05/05/17 2115 05/05/17 2215 05/06/17 0529  BP: 127/75 120/67 134/73 127/76  Pulse: 85 86 94 90  Resp:   18   Temp:   99.1 F (37.3 C) 98.6 F (37 C)  TempSrc:      SpO2:  100% 100% 98% 90%  Weight:   118.5 kg (261 lb 3.9 oz)   Height:   6\' 2"  (1.88 m)     Intake/Output Summary (Last 24 hours) at 05/06/17 0911 Last data filed at 05/06/17 0630  Gross per 24 hour  Intake          3261.67 ml  Output             1050 ml  Net          2211.67 ml   Filed Weights   05/05/17 1549 05/05/17 2215  Weight: 113.4 kg (250 lb) 118.5 kg (261 lb 3.9 oz)    Examination:  General exam: Appears calm and comfortable  Respiratory system: Clear to auscultation. Respiratory effort normal. Cardiovascular system: S1 & S2 heard, RRR. No JVD, murmurs, rubs, gallops or clicks. No pedal edema. Gastrointestinal system: Abdomen is nondistended, soft and nontender. No organomegaly or masses felt. Normal bowel sounds heard. Central nervous system: Alert and oriented. No focal neurological deficits. Extremities: rigth lower extremity cellulitis improving. Right knee swelling and tenderness improved.  Skin: No rashes, lesions or ulcers Psychiatry: Judgement and insight appear normal. Mood & affect appropriate.     Data Reviewed: I have personally reviewed following labs and imaging studies  CBC:  Recent Labs Lab 05/05/17 1339 05/06/17 0237  WBC 12.9* 11.2*  NEUTROABS 8.2* 7.0  HGB 13.4 10.5*  HCT 38.1* 30.9*  MCV 86.2 87.3  PLT 234  626   Basic Metabolic Panel:  Recent Labs Lab 05/05/17 1339 05/06/17 0237  NA 135 137  K 4.3 3.4*  CL 98* 100*  CO2 26 30  GLUCOSE 91 127*  BUN 16 9  CREATININE 0.89 0.97  CALCIUM 9.4 8.5*   GFR: Estimated Creatinine Clearance: 142.6 mL/min (by C-G formula based on SCr of 0.97 mg/dL). Liver Function Tests: No results for input(s): AST, ALT, ALKPHOS, BILITOT, PROT, ALBUMIN in the last 168 hours. No results for input(s): LIPASE, AMYLASE in the last 168 hours. No results for input(s): AMMONIA in the last 168 hours. Coagulation Profile: No results for input(s): INR, PROTIME in the last 168 hours. Cardiac Enzymes:  Recent  Labs Lab 05/05/17 1610  CKTOTAL 91   BNP (last 3 results) No results for input(s): PROBNP in the last 8760 hours. HbA1C: No results for input(s): HGBA1C in the last 72 hours. CBG: No results for input(s): GLUCAP in the last 168 hours. Lipid Profile: No results for input(s): CHOL, HDL, LDLCALC, TRIG, CHOLHDL, LDLDIRECT in the last 72 hours. Thyroid Function Tests: No results for input(s): TSH, T4TOTAL, FREET4, T3FREE, THYROIDAB in the last 72 hours. Anemia Panel: No results for input(s): VITAMINB12, FOLATE, FERRITIN, TIBC, IRON, RETICCTPCT in the last 72 hours. Sepsis Labs:  Recent Labs Lab 05/05/17 1353  LATICACIDVEN 2.18*    Recent Results (from the past 240 hour(s))  Gram stain     Status: None   Collection Time: 05/05/17  8:27 PM  Result Value Ref Range Status   Specimen Description FLUID SYNOVIAL RIGHT KNEE  Final   Special Requests Normal  Final   Gram Stain   Final    MODERATE WBC PRESENT, PREDOMINANTLY PMN NO ORGANISMS SEEN    Report Status 05/05/2017 FINAL  Final         Radiology Studies: Ct Foot Right W Contrast  Result Date: 05/05/2017 CLINICAL DATA:  Right lower extremity infection. Leg swelling from the foot to distal femur. Patient reports dropping phone on foot 6 days ago. EXAM: CT OF THE LOWER RIGHT EXTREMITY WITH CONTRAST TECHNIQUE: Multidetector CT imaging of the lower right extremity was performed according to the standard protocol following intravenous contrast administration. The right femur, tibia/fibula, and foot were image. COMPARISON:  None. CONTRAST:  150mL ISOVUE-300 IOPAMIDOL (ISOVUE-300) INJECTION 61% FINDINGS: Femur: Osseous structures: No femur fracture. No bony destructive change or periosteal reaction. Hip joint space is maintained. No hip joint effusion. There is a small to moderate knee joint effusion. There is some high-density debris within the knee joint. Soft tissues: Anterior subcutaneous soft tissue edema about the distal  femur/thigh. No fluid collection. No soft tissue air. Streak artifact from the external metallic density in the mid femur. No intramuscular fluid collection. Normal fatty striations persist. No gross filling defects within the visualized venous structures to suggest DVT. Tibia/fibula: Osseous structures: Moderate knee joint effusion with some high-density intra-articular debris in the knee joint. Tibia and fibula are intact without fracture. No bony destructive change or periosteal reaction. Minimal osseous 6 gross shins from the medial aspect of the distal tibia suggests prior injury. Soft tissues: Anterior subcutaneous soft tissue edema about the lower leg. No focal fluid collection or abscess. No soft tissue air. No intramuscular collection. Normal fatty muscle striations persists. No gross filling defects within the visualized venous structures to suggest DVT. Foot: Osseous structures: No acute fracture of the foot or ankle. No bony destructive change. There is a small ankle joint effusion. Soft tissues: Skin thickening and  soft tissue edema about the foot and ankle, most prominent laterally. No focal fluid collection. No soft tissue air. IMPRESSION: 1. Soft tissue edema of the right lower extremity, most prominent about the lateral foot and ankle, extending up to the distal femur. This may be secondary to cellulitis/infection. No abscess or soft tissue air. 2. No acute osseous abnormality of the right lower extremity. 3. Moderate knee joint effusion containing high-density material, possible small osteochondral bodies. Small tibial talar joint effusion. Electronically Signed   By: Jeb Levering M.D.   On: 05/05/2017 18:43   Ct Femur Right W Contrast  Result Date: 05/05/2017 CLINICAL DATA:  Right lower extremity infection. Leg swelling from the foot to distal femur. Patient reports dropping phone on foot 6 days ago. EXAM: CT OF THE LOWER RIGHT EXTREMITY WITH CONTRAST TECHNIQUE: Multidetector CT imaging of  the lower right extremity was performed according to the standard protocol following intravenous contrast administration. The right femur, tibia/fibula, and foot were image. COMPARISON:  None. CONTRAST:  172mL ISOVUE-300 IOPAMIDOL (ISOVUE-300) INJECTION 61% FINDINGS: Femur: Osseous structures: No femur fracture. No bony destructive change or periosteal reaction. Hip joint space is maintained. No hip joint effusion. There is a small to moderate knee joint effusion. There is some high-density debris within the knee joint. Soft tissues: Anterior subcutaneous soft tissue edema about the distal femur/thigh. No fluid collection. No soft tissue air. Streak artifact from the external metallic density in the mid femur. No intramuscular fluid collection. Normal fatty striations persist. No gross filling defects within the visualized venous structures to suggest DVT. Tibia/fibula: Osseous structures: Moderate knee joint effusion with some high-density intra-articular debris in the knee joint. Tibia and fibula are intact without fracture. No bony destructive change or periosteal reaction. Minimal osseous 6 gross shins from the medial aspect of the distal tibia suggests prior injury. Soft tissues: Anterior subcutaneous soft tissue edema about the lower leg. No focal fluid collection or abscess. No soft tissue air. No intramuscular collection. Normal fatty muscle striations persists. No gross filling defects within the visualized venous structures to suggest DVT. Foot: Osseous structures: No acute fracture of the foot or ankle. No bony destructive change. There is a small ankle joint effusion. Soft tissues: Skin thickening and soft tissue edema about the foot and ankle, most prominent laterally. No focal fluid collection. No soft tissue air. IMPRESSION: 1. Soft tissue edema of the right lower extremity, most prominent about the lateral foot and ankle, extending up to the distal femur. This may be secondary to  cellulitis/infection. No abscess or soft tissue air. 2. No acute osseous abnormality of the right lower extremity. 3. Moderate knee joint effusion containing high-density material, possible small osteochondral bodies. Small tibial talar joint effusion. Electronically Signed   By: Jeb Levering M.D.   On: 05/05/2017 18:43   Ct Tibia Fibula Right W Contrast  Result Date: 05/05/2017 CLINICAL DATA:  Right lower extremity infection. Leg swelling from the foot to distal femur. Patient reports dropping phone on foot 6 days ago. EXAM: CT OF THE LOWER RIGHT EXTREMITY WITH CONTRAST TECHNIQUE: Multidetector CT imaging of the lower right extremity was performed according to the standard protocol following intravenous contrast administration. The right femur, tibia/fibula, and foot were image. COMPARISON:  None. CONTRAST:  156mL ISOVUE-300 IOPAMIDOL (ISOVUE-300) INJECTION 61% FINDINGS: Femur: Osseous structures: No femur fracture. No bony destructive change or periosteal reaction. Hip joint space is maintained. No hip joint effusion. There is a small to moderate knee joint effusion. There is some  high-density debris within the knee joint. Soft tissues: Anterior subcutaneous soft tissue edema about the distal femur/thigh. No fluid collection. No soft tissue air. Streak artifact from the external metallic density in the mid femur. No intramuscular fluid collection. Normal fatty striations persist. No gross filling defects within the visualized venous structures to suggest DVT. Tibia/fibula: Osseous structures: Moderate knee joint effusion with some high-density intra-articular debris in the knee joint. Tibia and fibula are intact without fracture. No bony destructive change or periosteal reaction. Minimal osseous 6 gross shins from the medial aspect of the distal tibia suggests prior injury. Soft tissues: Anterior subcutaneous soft tissue edema about the lower leg. No focal fluid collection or abscess. No soft tissue air.  No intramuscular collection. Normal fatty muscle striations persists. No gross filling defects within the visualized venous structures to suggest DVT. Foot: Osseous structures: No acute fracture of the foot or ankle. No bony destructive change. There is a small ankle joint effusion. Soft tissues: Skin thickening and soft tissue edema about the foot and ankle, most prominent laterally. No focal fluid collection. No soft tissue air. IMPRESSION: 1. Soft tissue edema of the right lower extremity, most prominent about the lateral foot and ankle, extending up to the distal femur. This may be secondary to cellulitis/infection. No abscess or soft tissue air. 2. No acute osseous abnormality of the right lower extremity. 3. Moderate knee joint effusion containing high-density material, possible small osteochondral bodies. Small tibial talar joint effusion. Electronically Signed   By: Jeb Levering M.D.   On: 05/05/2017 18:43        Scheduled Meds: . citalopram  40 mg Oral Daily  . DULoxetine  30 mg Oral Daily  . enoxaparin (LOVENOX) injection  40 mg Subcutaneous Q24H  . folic acid  1 mg Oral Daily  . hydrochlorothiazide  25 mg Oral Daily  . LORazepam  0-4 mg Oral Q6H   Followed by  . [START ON 05/08/2017] LORazepam  0-4 mg Oral Q12H  . losartan  100 mg Oral Daily  . multivitamin with minerals  1 tablet Oral Daily  . thiamine  100 mg Oral Daily   Or  . thiamine  100 mg Intravenous Daily   Continuous Infusions: . cefTRIAXone (ROCEPHIN)  IV Stopped (05/05/17 2347)     LOS: 0 days    Time spent: 56 minutes    Quamesha Mullet, MD [facility] Pager [phone]  If 7PM-7AM, please contact night-coverage [email or web address] Password [email or web address] 05/06/2017, 9:11 AM

## 2017-05-06 NOTE — Telephone Encounter (Signed)
Dr Silvio Pate has the ER notes

## 2017-05-07 DIAGNOSIS — F411 Generalized anxiety disorder: Secondary | ICD-10-CM | POA: Diagnosis present

## 2017-05-07 DIAGNOSIS — M109 Gout, unspecified: Secondary | ICD-10-CM | POA: Diagnosis present

## 2017-05-07 DIAGNOSIS — L03115 Cellulitis of right lower limb: Secondary | ICD-10-CM | POA: Diagnosis present

## 2017-05-07 DIAGNOSIS — M25461 Effusion, right knee: Secondary | ICD-10-CM | POA: Diagnosis present

## 2017-05-07 DIAGNOSIS — I10 Essential (primary) hypertension: Secondary | ICD-10-CM | POA: Diagnosis present

## 2017-05-07 DIAGNOSIS — E876 Hypokalemia: Secondary | ICD-10-CM | POA: Diagnosis present

## 2017-05-07 DIAGNOSIS — M549 Dorsalgia, unspecified: Secondary | ICD-10-CM | POA: Diagnosis present

## 2017-05-07 DIAGNOSIS — F329 Major depressive disorder, single episode, unspecified: Secondary | ICD-10-CM | POA: Diagnosis present

## 2017-05-07 DIAGNOSIS — G8929 Other chronic pain: Secondary | ICD-10-CM | POA: Diagnosis present

## 2017-05-07 DIAGNOSIS — F1020 Alcohol dependence, uncomplicated: Secondary | ICD-10-CM | POA: Diagnosis present

## 2017-05-07 DIAGNOSIS — F1010 Alcohol abuse, uncomplicated: Secondary | ICD-10-CM | POA: Diagnosis not present

## 2017-05-07 DIAGNOSIS — F900 Attention-deficit hyperactivity disorder, predominantly inattentive type: Secondary | ICD-10-CM | POA: Diagnosis present

## 2017-05-07 LAB — CBC
HCT: 32 % — ABNORMAL LOW (ref 39.0–52.0)
Hemoglobin: 10.7 g/dL — ABNORMAL LOW (ref 13.0–17.0)
MCH: 29.5 pg (ref 26.0–34.0)
MCHC: 33.4 g/dL (ref 30.0–36.0)
MCV: 88.2 fL (ref 78.0–100.0)
PLATELETS: 222 10*3/uL (ref 150–400)
RBC: 3.63 MIL/uL — ABNORMAL LOW (ref 4.22–5.81)
RDW: 12.4 % (ref 11.5–15.5)
WBC: 8 10*3/uL (ref 4.0–10.5)

## 2017-05-07 LAB — BASIC METABOLIC PANEL
ANION GAP: 9 (ref 5–15)
BUN: 12 mg/dL (ref 6–20)
CALCIUM: 9.1 mg/dL (ref 8.9–10.3)
CO2: 29 mmol/L (ref 22–32)
CREATININE: 0.84 mg/dL (ref 0.61–1.24)
Chloride: 98 mmol/L — ABNORMAL LOW (ref 101–111)
GLUCOSE: 113 mg/dL — AB (ref 65–99)
Potassium: 3.8 mmol/L (ref 3.5–5.1)
Sodium: 136 mmol/L (ref 135–145)

## 2017-05-07 LAB — SYNOVIAL CELL COUNT + DIFF, W/ CRYSTALS
Eosinophils-Synovial: 0 % (ref 0–1)
LYMPHOCYTES-SYNOVIAL FLD: 4 % (ref 0–20)
MONOCYTE-MACROPHAGE-SYNOVIAL FLUID: 10 % — AB (ref 50–90)
Neutrophil, Synovial: 86 % — ABNORMAL HIGH (ref 0–25)
WBC, Synovial: 8600 /mm3 — ABNORMAL HIGH (ref 0–200)

## 2017-05-07 LAB — GRAM STAIN

## 2017-05-07 MED ORDER — COLCHICINE 0.6 MG PO TABS
0.6000 mg | ORAL_TABLET | Freq: Every day | ORAL | Status: DC
  Administered 2017-05-07 – 2017-05-08 (×2): 0.6 mg via ORAL
  Filled 2017-05-07 (×2): qty 1

## 2017-05-07 MED ORDER — LIDOCAINE HCL 1 % IJ SOLN
10.0000 mL | Freq: Once | INTRAMUSCULAR | Status: DC
  Filled 2017-05-07: qty 10

## 2017-05-07 MED ORDER — PREDNISONE 50 MG PO TABS
60.0000 mg | ORAL_TABLET | Freq: Every day | ORAL | Status: DC
  Administered 2017-05-08: 60 mg via ORAL
  Filled 2017-05-07: qty 1

## 2017-05-07 MED ORDER — METHYLPREDNISOLONE ACETATE 40 MG/ML IJ SUSP
40.0000 mg | Freq: Once | INTRAMUSCULAR | Status: DC
  Filled 2017-05-07: qty 1

## 2017-05-07 MED ORDER — BUPIVACAINE HCL (PF) 0.5 % IJ SOLN
10.0000 mL | Freq: Once | INTRAMUSCULAR | Status: DC
  Filled 2017-05-07: qty 10

## 2017-05-07 NOTE — Progress Notes (Signed)
PROGRESS NOTE    Mark Burns  XBW:[id number] DOB: [birth year] DOA: 05/05/2017 PCP: Venia Carbon, MD    Brief Narrative: Mark Burns is a 36 y.o. male with medical history significant for hypertension, depression, anxiety, and daily alcohol use, now presenting to the emergency department for evaluation of pain, redness, and swelling involving the right lower leg. Admitted for right leg cellulitis and right knee effusion.  He was found to have right knee effusion, underwent fluid aspiration of the right knee, analysis sent.  Today patient reports the right knee pain is worse and swelling appears to be worse than yesterday. Orthopedics consulted.   Assessment & Plan:   Principal Problem:   Cellulitis of right leg Active Problems:   Hypertension   Generalized anxiety disorder   Chronic back pain   Alcohol abuse, daily use   Effusion of right knee joint   Cellulitis of the right lower extremity: Appears to be improving.  Currently on IV rocephin 2 g every 24 hours.  venous duplex negative for DVT.  CT shows soft tissue edema, no other abnormalities.  Blood cultures negative so far.  Afebrile, leukocytosis improving. Lactic acid is normal   Right knee effusion, underwent fluid aspiration and sent for analysis. Gram stain does not show any bacteria.  No crystals seen. Wbc count elevated in 4000's. Cultures sent and pending.  Today the right knee is more swollen, tender and movement is restricted. Orthopedics consulted for further recommendations.  Resume rocephin and pain control.    Depression and anxiety:  Resume homemeds.    Alcohol dependence:  No signs of withdrawal.  Resume CIWA   Hypertension: controlled.  Resume losartan.    Hypokalemia: replaced.   DVT prophylaxis: ( lovenox) Code Status: (Full) Family Communication: (none at bedside.) Disposition Plan: pending resolution of the right leg cellulitis.    Consultants:   None.    Procedures: fluid  aspiration from R knee.   Antimicrobials: rocephin.    Subjective: Right knee swelling more and tender more.   Objective: Vitals:   05/06/17 1814 05/07/17 0030 05/07/17 0538 05/07/17 1256  BP: 134/80 115/70 118/73 137/88  Pulse: 86 86 86 85  Resp: 20 17 16  (!) 28  Temp: 99.2 F (37.3 C) 98.4 F (36.9 C) 99.1 F (37.3 C) 99 F (37.2 C)  TempSrc: Oral Oral Oral Oral  SpO2: 95% 94% 94% 94%  Weight:      Height:        Intake/Output Summary (Last 24 hours) at 05/07/17 1836 Last data filed at 05/07/17 0659  Gross per 24 hour  Intake              965 ml  Output             1875 ml  Net             -910 ml   Filed Weights   05/05/17 1549 05/05/17 2215  Weight: 113.4 kg (250 lb) 118.5 kg (261 lb 3.9 oz)    Examination:  General exam: Appears anxious today, but comfortable.  Respiratory system: Clear to auscultation. Respiratory effort normal. No wheezing or rhonchi.  Cardiovascular system: S1 & S2 heard, RRR. No JVD, murmurs, rubs, gallops or clicks. No pedal edema. Gastrointestinal system: Abdomen is nondistended, soft and nontender. No organomegaly or masses felt. Normal bowel sounds heard. Central nervous system: Alert and oriented. No focal neurological deficits. Extremities: rigth lower extremity cellulitis improving. Right knee swelling and tenderness  Worse today.  Skin:  No rashes, lesions or ulcers Psychiatry: Judgement and insight appear normal. Mood & affect appropriate.     Data Reviewed: I have personally reviewed following labs and imaging studies  CBC:  Recent Labs Lab 05/05/17 1339 05/06/17 0237 05/07/17 1013  WBC 12.9* 11.2* 8.0  NEUTROABS 8.2* 7.0  --   HGB 13.4 10.5* 10.7*  HCT 38.1* 30.9* 32.0*  MCV 86.2 87.3 88.2  PLT 234 210 681   Basic Metabolic Panel:  Recent Labs Lab 05/05/17 1339 05/06/17 0237 05/07/17 1013  NA 135 137 136  K 4.3 3.4* 3.8  CL 98* 100* 98*  CO2 26 30 29   GLUCOSE 91 127* 113*  BUN 16 9 12   CREATININE 0.89  0.97 0.84  CALCIUM 9.4 8.5* 9.1   GFR: Estimated Creatinine Clearance: 164.7 mL/min (by C-G formula based on SCr of 0.84 mg/dL). Liver Function Tests: No results for input(s): AST, ALT, ALKPHOS, BILITOT, PROT, ALBUMIN in the last 168 hours. No results for input(s): LIPASE, AMYLASE in the last 168 hours. No results for input(s): AMMONIA in the last 168 hours. Coagulation Profile: No results for input(s): INR, PROTIME in the last 168 hours. Cardiac Enzymes:  Recent Labs Lab 05/05/17 1610  CKTOTAL 91   BNP (last 3 results) No results for input(s): PROBNP in the last 8760 hours. HbA1C: No results for input(s): HGBA1C in the last 72 hours. CBG: No results for input(s): GLUCAP in the last 168 hours. Lipid Profile: No results for input(s): CHOL, HDL, LDLCALC, TRIG, CHOLHDL, LDLDIRECT in the last 72 hours. Thyroid Function Tests: No results for input(s): TSH, T4TOTAL, FREET4, T3FREE, THYROIDAB in the last 72 hours. Anemia Panel: No results for input(s): VITAMINB12, FOLATE, FERRITIN, TIBC, IRON, RETICCTPCT in the last 72 hours. Sepsis Labs:  Recent Labs Lab 05/05/17 1353 05/06/17 1335  LATICACIDVEN 2.18* 1.0    Recent Results (from the past 240 hour(s))  Culture, blood (Routine X 2) w Reflex to ID Panel     Status: None (Preliminary result)   Collection Time: 05/05/17  4:00 PM  Result Value Ref Range Status   Specimen Description BLOOD RIGHT ANTECUBITAL  Final   Special Requests   Final    BOTTLES DRAWN AEROBIC AND ANAEROBIC Blood Culture adequate volume   Culture NO GROWTH 2 DAYS  Final   Report Status PENDING  Incomplete  Culture, blood (Routine X 2) w Reflex to ID Panel     Status: None (Preliminary result)   Collection Time: 05/05/17  4:10 PM  Result Value Ref Range Status   Specimen Description BLOOD RIGHT ARM  Final   Special Requests   Final    BOTTLES DRAWN AEROBIC ONLY Blood Culture adequate volume   Culture NO GROWTH 2 DAYS  Final   Report Status PENDING   Incomplete  Gram stain     Status: None   Collection Time: 05/05/17  8:27 PM  Result Value Ref Range Status   Specimen Description FLUID SYNOVIAL RIGHT KNEE  Final   Special Requests Normal  Final   Gram Stain   Final    MODERATE WBC PRESENT, PREDOMINANTLY PMN NO ORGANISMS SEEN    Report Status 05/05/2017 FINAL  Final  Culture, body fluid-bottle     Status: None (Preliminary result)   Collection Time: 05/05/17  8:27 PM  Result Value Ref Range Status   Specimen Description FLUID SYNOVIAL RIGHT KNEE  Final   Special Requests   Final    BOTTLES DRAWN AEROBIC AND ANAEROBIC Blood Culture adequate volume  Culture NO GROWTH 2 DAYS  Final   Report Status PENDING  Incomplete  Stat Gram stain     Status: None   Collection Time: 05/07/17  3:03 PM  Result Value Ref Range Status   Specimen Description FLUID SYNOVIAL RIGHT KNEE  Final   Special Requests NONE  Final   Gram Stain   Final    ABUNDANT WBC PRESENT, PREDOMINANTLY PMN NO ORGANISMS SEEN    Report Status 05/07/2017 FINAL  Final         Radiology Studies: No results found.      Scheduled Meds: . bupivacaine  10 mL Infiltration Once  . citalopram  40 mg Oral Daily  . colchicine  0.6 mg Oral Daily  . DULoxetine  30 mg Oral Daily  . enoxaparin (LOVENOX) injection  40 mg Subcutaneous Q24H  . folic acid  1 mg Oral Daily  . hydrochlorothiazide  25 mg Oral Daily  . lidocaine  10 mL Infiltration Once  . LORazepam  0-4 mg Oral Q6H   Followed by  . [START ON 05/08/2017] LORazepam  0-4 mg Oral Q12H  . losartan  100 mg Oral Daily  . multivitamin with minerals  1 tablet Oral Daily  . [START ON 05/08/2017] predniSONE  60 mg Oral Q breakfast  . thiamine  100 mg Oral Daily   Or  . thiamine  100 mg Intravenous Daily   Continuous Infusions: . cefTRIAXone (ROCEPHIN)  IV Stopped (05/07/17 0001)     LOS: 0 days    Time spent: 40 minutes    Emmabelle Fear, MD [facility] Pager [phone]  If 7PM-7AM, please contact  night-coverage [email or web address] Password [email or web address] 05/07/2017, 6:36 PM

## 2017-05-07 NOTE — Procedures (Signed)
Procedure: Right knee aspiration and injection  Indication: Right knee effusion(s)  Surgeon: Silvestre Gunner, PA-C  Assist: None  Anesthesia: None  EBL: None  Complications: None  Findings: After risks/benefits explained patient desires to undergo procedure. Consent obtained and time out performed. Right knee was sterilely prepped and aspirated. 20ml dark bloody fluid obtained. Injected with Lidocaine/Marcaine. Pt tolerated the procedure well.    Lisette Abu, PA-C Orthopedic Surgery [phone]

## 2017-05-07 NOTE — Consult Note (Signed)
Reason for Consult:Knee effusion Referring Physician: V Camar Guyton is an 37 y.o. male.  HPI: Tishawn was admitted Saturday with possible sepsis. He was in [facility] with his family on the prior Monday when he used his foot to break the fall of his phone. He had immediate pain but no break in the skin. It bothered him mildly (he was able to run through it) but began to increase as the week wore on. He began to get pain in his calf and then his knee by Friday. It was bad enough at that point that he couldn't walk on it. He also noticed some redness. He has a history of gout in the left great MTP that he treats prn with Indocin. He has had some subjective fevers and chills and some nausea, all predominately on Friday. Things are better since then but the knee is still bad and, though he thinks it's a little better, his attending thinks it's a bit worse.  Past Medical History:  Diagnosis Date  . ADHD, predominantly inattentive type   . Elevated LFTs 11/19/2013  . Erectile dysfunction   . Generalized anxiety disorder   . Hypertension   . Recurrent cold sores   . Seborrheic dermatitis     Past Surgical History:  Procedure Laterality Date  . VASECTOMY  2014    History reviewed. No pertinent family history.  Social History:  reports that he has never smoked. He has never used smokeless tobacco. He reports that he drinks alcohol. He reports that he does not use drugs.  Allergies:  Allergies  Allergen Reactions  . Sulfa Antibiotics Hives    Medications: I have reviewed the patient's current medications.  Results for orders placed or performed during the hospital encounter of 05/05/17 (from the past 48 hour(s))  Culture, blood (Routine X 2) w Reflex to ID Panel     Status: None (Preliminary result)   Collection Time: 05/05/17  4:00 PM  Result Value Ref Range   Specimen Description BLOOD RIGHT ANTECUBITAL    Special Requests      BOTTLES DRAWN AEROBIC AND ANAEROBIC Blood Culture adequate  volume   Culture NO GROWTH < 24 HOURS    Report Status PENDING   Culture, blood (Routine X 2) w Reflex to ID Panel     Status: None (Preliminary result)   Collection Time: 05/05/17  4:10 PM  Result Value Ref Range   Specimen Description BLOOD RIGHT ARM    Special Requests      BOTTLES DRAWN AEROBIC ONLY Blood Culture adequate volume   Culture NO GROWTH < 24 HOURS    Report Status PENDING   C-reactive protein     Status: Abnormal   Collection Time: 05/05/17  4:10 PM  Result Value Ref Range   CRP 6.8 (H) <1.0 mg/dL  Sedimentation rate     Status: Abnormal   Collection Time: 05/05/17  4:10 PM  Result Value Ref Range   Sed Rate 31 (H) 0 - 16 mm/hr  CK     Status: None   Collection Time: 05/05/17  4:10 PM  Result Value Ref Range   Total CK 91 49 - 397 U/L  Gram stain     Status: None   Collection Time: 05/05/17  8:27 PM  Result Value Ref Range   Specimen Description FLUID SYNOVIAL RIGHT KNEE    Special Requests Normal    Gram Stain      MODERATE WBC PRESENT, PREDOMINANTLY PMN NO ORGANISMS SEEN  Report Status 05/05/2017 FINAL   Culture, body fluid-bottle     Status: None (Preliminary result)   Collection Time: 05/05/17  8:27 PM  Result Value Ref Range   Specimen Description FLUID SYNOVIAL RIGHT KNEE    Special Requests      BOTTLES DRAWN AEROBIC AND ANAEROBIC Blood Culture adequate volume   Culture NO GROWTH < 24 HOURS    Report Status PENDING   Synovial cell count + diff, w/ crystals     Status: Abnormal   Collection Time: 05/05/17  8:45 PM  Result Value Ref Range   Color, Synovial STRAW (A) YELLOW   Appearance-Synovial TURBID (A) CLEAR   Crystals, Fluid NO CRYSTALS SEEN    WBC, Synovial 4,515 (H) 0 - 200 /cu mm   Neutrophil, Synovial 77 (H) 0 - 25 %   Lymphocytes-Synovial Fld 1 0 - 20 %   Monocyte-Macrophage-Synovial Fluid 22 (L) 50 - 90 %   Eosinophils-Synovial 0 0 - 1 %  HIV antibody (Routine Testing)     Status: None   Collection Time: 05/06/17  2:37 AM  Result  Value Ref Range   HIV Screen 4th Generation wRfx Non Reactive Non Reactive    Comment: (NOTE) Performed At: [facility] [place], [place] [id number] Lindon Romp MD HC:[id number]   Basic metabolic panel     Status: Abnormal   Collection Time: 05/06/17  2:37 AM  Result Value Ref Range   Sodium 137 135 - 145 mmol/L   Potassium 3.4 (L) 3.5 - 5.1 mmol/L    Comment: DELTA CHECK NOTED   Chloride 100 (L) 101 - 111 mmol/L   CO2 30 22 - 32 mmol/L   Glucose, Bld 127 (H) 65 - 99 mg/dL   BUN 9 6 - 20 mg/dL   Creatinine, Ser 0.97 0.61 - 1.24 mg/dL   Calcium 8.5 (L) 8.9 - 10.3 mg/dL   GFR calc non Af Amer >60 >60 mL/min   GFR calc Af Amer >60 >60 mL/min    Comment: (NOTE) The eGFR has been calculated using the CKD EPI equation. This calculation has not been validated in all clinical situations. eGFR's persistently <60 mL/min signify possible Chronic Kidney Disease.    Anion gap 7 5 - 15  CBC WITH DIFFERENTIAL     Status: Abnormal   Collection Time: 05/06/17  2:37 AM  Result Value Ref Range   WBC 11.2 (H) 4.0 - 10.5 K/uL   RBC 3.54 (L) 4.22 - 5.81 MIL/uL   Hemoglobin 10.5 (L) 13.0 - 17.0 g/dL    Comment: DELTA CHECK NOTED REPEATED TO VERIFY    HCT 30.9 (L) 39.0 - 52.0 %   MCV 87.3 78.0 - 100.0 fL   MCH 29.7 26.0 - 34.0 pg   MCHC 34.0 30.0 - 36.0 g/dL   RDW 12.5 11.5 - 15.5 %   Platelets 210 150 - 400 K/uL   Neutrophils Relative % 62 %   Lymphocytes Relative 29 %   Monocytes Relative 8 %   Eosinophils Relative 1 %   Basophils Relative 0 %   Neutro Abs 7.0 1.7 - 7.7 K/uL   Lymphs Abs 3.2 0.7 - 4.0 K/uL   Monocytes Absolute 0.9 0.1 - 1.0 K/uL   Eosinophils Absolute 0.1 0.0 - 0.7 K/uL   Basophils Absolute 0.0 0.0 - 0.1 K/uL   RBC Morphology STOMATOCYTES    WBC Morphology ATYPICAL LYMPHOCYTES   Sedimentation rate     Status: Abnormal   Collection Time:  05/06/17  2:37 AM  Result Value Ref Range   Sed Rate 48 (H) 0 - 16 mm/hr  C-reactive protein      Status: Abnormal   Collection Time: 05/06/17  2:37 AM  Result Value Ref Range   CRP 9.5 (H) <1.0 mg/dL  Lactic acid, plasma     Status: None   Collection Time: 05/06/17  1:35 PM  Result Value Ref Range   Lactic Acid, Venous 1.0 0.5 - 1.9 mmol/L  CBC     Status: Abnormal   Collection Time: 05/07/17 10:13 AM  Result Value Ref Range   WBC 8.0 4.0 - 10.5 K/uL   RBC 3.63 (L) 4.22 - 5.81 MIL/uL   Hemoglobin 10.7 (L) 13.0 - 17.0 g/dL   HCT 32.0 (L) 39.0 - 52.0 %   MCV 88.2 78.0 - 100.0 fL   MCH 29.5 26.0 - 34.0 pg   MCHC 33.4 30.0 - 36.0 g/dL   RDW 12.4 11.5 - 15.5 %   Platelets 222 150 - 400 K/uL  Basic metabolic panel     Status: Abnormal   Collection Time: 05/07/17 10:13 AM  Result Value Ref Range   Sodium 136 135 - 145 mmol/L   Potassium 3.8 3.5 - 5.1 mmol/L   Chloride 98 (L) 101 - 111 mmol/L   CO2 29 22 - 32 mmol/L   Glucose, Bld 113 (H) 65 - 99 mg/dL   BUN 12 6 - 20 mg/dL   Creatinine, Ser 0.84 0.61 - 1.24 mg/dL   Calcium 9.1 8.9 - 10.3 mg/dL   GFR calc non Af Amer >60 >60 mL/min   GFR calc Af Amer >60 >60 mL/min    Comment: (NOTE) The eGFR has been calculated using the CKD EPI equation. This calculation has not been validated in all clinical situations. eGFR's persistently <60 mL/min signify possible Chronic Kidney Disease.    Anion gap 9 5 - 15    Ct Foot Right W Contrast  Result Date: 05/05/2017 CLINICAL DATA:  Right lower extremity infection. Leg swelling from the foot to distal femur. Patient reports dropping phone on foot 6 days ago. EXAM: CT OF THE LOWER RIGHT EXTREMITY WITH CONTRAST TECHNIQUE: Multidetector CT imaging of the lower right extremity was performed according to the standard protocol following intravenous contrast administration. The right femur, tibia/fibula, and foot were image. COMPARISON:  None. CONTRAST:  124m ISOVUE-300 IOPAMIDOL (ISOVUE-300) INJECTION 61% FINDINGS: Femur: Osseous structures: No femur fracture. No bony destructive change or  periosteal reaction. Hip joint space is maintained. No hip joint effusion. There is a small to moderate knee joint effusion. There is some high-density debris within the knee joint. Soft tissues: Anterior subcutaneous soft tissue edema about the distal femur/thigh. No fluid collection. No soft tissue air. Streak artifact from the external metallic density in the mid femur. No intramuscular fluid collection. Normal fatty striations persist. No gross filling defects within the visualized venous structures to suggest DVT. Tibia/fibula: Osseous structures: Moderate knee joint effusion with some high-density intra-articular debris in the knee joint. Tibia and fibula are intact without fracture. No bony destructive change or periosteal reaction. Minimal osseous 6 gross shins from the medial aspect of the distal tibia suggests prior injury. Soft tissues: Anterior subcutaneous soft tissue edema about the lower leg. No focal fluid collection or abscess. No soft tissue air. No intramuscular collection. Normal fatty muscle striations persists. No gross filling defects within the visualized venous structures to suggest DVT. Foot: Osseous structures: No acute fracture of the foot  or ankle. No bony destructive change. There is a small ankle joint effusion. Soft tissues: Skin thickening and soft tissue edema about the foot and ankle, most prominent laterally. No focal fluid collection. No soft tissue air. IMPRESSION: 1. Soft tissue edema of the right lower extremity, most prominent about the lateral foot and ankle, extending up to the distal femur. This may be secondary to cellulitis/infection. No abscess or soft tissue air. 2. No acute osseous abnormality of the right lower extremity. 3. Moderate knee joint effusion containing high-density material, possible small osteochondral bodies. Small tibial talar joint effusion. Electronically Signed   By: Jeb Levering M.D.   On: 05/05/2017 18:43   Ct Femur Right W  Contrast  Result Date: 05/05/2017 CLINICAL DATA:  Right lower extremity infection. Leg swelling from the foot to distal femur. Patient reports dropping phone on foot 6 days ago. EXAM: CT OF THE LOWER RIGHT EXTREMITY WITH CONTRAST TECHNIQUE: Multidetector CT imaging of the lower right extremity was performed according to the standard protocol following intravenous contrast administration. The right femur, tibia/fibula, and foot were image. COMPARISON:  None. CONTRAST:  143m ISOVUE-300 IOPAMIDOL (ISOVUE-300) INJECTION 61% FINDINGS: Femur: Osseous structures: No femur fracture. No bony destructive change or periosteal reaction. Hip joint space is maintained. No hip joint effusion. There is a small to moderate knee joint effusion. There is some high-density debris within the knee joint. Soft tissues: Anterior subcutaneous soft tissue edema about the distal femur/thigh. No fluid collection. No soft tissue air. Streak artifact from the external metallic density in the mid femur. No intramuscular fluid collection. Normal fatty striations persist. No gross filling defects within the visualized venous structures to suggest DVT. Tibia/fibula: Osseous structures: Moderate knee joint effusion with some high-density intra-articular debris in the knee joint. Tibia and fibula are intact without fracture. No bony destructive change or periosteal reaction. Minimal osseous 6 gross shins from the medial aspect of the distal tibia suggests prior injury. Soft tissues: Anterior subcutaneous soft tissue edema about the lower leg. No focal fluid collection or abscess. No soft tissue air. No intramuscular collection. Normal fatty muscle striations persists. No gross filling defects within the visualized venous structures to suggest DVT. Foot: Osseous structures: No acute fracture of the foot or ankle. No bony destructive change. There is a small ankle joint effusion. Soft tissues: Skin thickening and soft tissue edema about the foot and  ankle, most prominent laterally. No focal fluid collection. No soft tissue air. IMPRESSION: 1. Soft tissue edema of the right lower extremity, most prominent about the lateral foot and ankle, extending up to the distal femur. This may be secondary to cellulitis/infection. No abscess or soft tissue air. 2. No acute osseous abnormality of the right lower extremity. 3. Moderate knee joint effusion containing high-density material, possible small osteochondral bodies. Small tibial talar joint effusion. Electronically Signed   By: MJeb LeveringM.D.   On: 05/05/2017 18:43   Ct Tibia Fibula Right W Contrast  Result Date: 05/05/2017 CLINICAL DATA:  Right lower extremity infection. Leg swelling from the foot to distal femur. Patient reports dropping phone on foot 6 days ago. EXAM: CT OF THE LOWER RIGHT EXTREMITY WITH CONTRAST TECHNIQUE: Multidetector CT imaging of the lower right extremity was performed according to the standard protocol following intravenous contrast administration. The right femur, tibia/fibula, and foot were image. COMPARISON:  None. CONTRAST:  1077mISOVUE-300 IOPAMIDOL (ISOVUE-300) INJECTION 61% FINDINGS: Femur: Osseous structures: No femur fracture. No bony destructive change or periosteal reaction. Hip joint space  is maintained. No hip joint effusion. There is a small to moderate knee joint effusion. There is some high-density debris within the knee joint. Soft tissues: Anterior subcutaneous soft tissue edema about the distal femur/thigh. No fluid collection. No soft tissue air. Streak artifact from the external metallic density in the mid femur. No intramuscular fluid collection. Normal fatty striations persist. No gross filling defects within the visualized venous structures to suggest DVT. Tibia/fibula: Osseous structures: Moderate knee joint effusion with some high-density intra-articular debris in the knee joint. Tibia and fibula are intact without fracture. No bony destructive change or  periosteal reaction. Minimal osseous 6 gross shins from the medial aspect of the distal tibia suggests prior injury. Soft tissues: Anterior subcutaneous soft tissue edema about the lower leg. No focal fluid collection or abscess. No soft tissue air. No intramuscular collection. Normal fatty muscle striations persists. No gross filling defects within the visualized venous structures to suggest DVT. Foot: Osseous structures: No acute fracture of the foot or ankle. No bony destructive change. There is a small ankle joint effusion. Soft tissues: Skin thickening and soft tissue edema about the foot and ankle, most prominent laterally. No focal fluid collection. No soft tissue air. IMPRESSION: 1. Soft tissue edema of the right lower extremity, most prominent about the lateral foot and ankle, extending up to the distal femur. This may be secondary to cellulitis/infection. No abscess or soft tissue air. 2. No acute osseous abnormality of the right lower extremity. 3. Moderate knee joint effusion containing high-density material, possible small osteochondral bodies. Small tibial talar joint effusion. Electronically Signed   By: Jeb Levering M.D.   On: 05/05/2017 18:43    Review of Systems  Constitutional: Positive for chills and fever. Negative for weight loss.  HENT: Negative for ear discharge, ear pain, hearing loss and tinnitus.   Eyes: Negative for blurred vision, double vision, photophobia and pain.  Respiratory: Negative for cough, sputum production and shortness of breath.   Cardiovascular: Negative for chest pain.  Gastrointestinal: Positive for nausea. Negative for abdominal pain and vomiting.  Genitourinary: Negative for dysuria, flank pain, frequency and urgency.  Musculoskeletal: Positive for joint pain (Right knee) and myalgias (Right lower leg). Negative for back pain, falls and neck pain.  Neurological: Negative for dizziness, tingling, sensory change, focal weakness, loss of consciousness and  headaches.  Endo/Heme/Allergies: Does not bruise/bleed easily.  Psychiatric/Behavioral: Negative for depression, memory loss and substance abuse. The patient is not nervous/anxious.    Blood pressure 137/88, pulse 85, temperature 99 F (37.2 C), temperature source Oral, resp. rate (!) 28, height 6' 2" (1.88 m), weight 118.5 kg (261 lb 3.9 oz), SpO2 94 %. Physical Exam  Constitutional: He appears well-developed and well-nourished. No distress.  HENT:  Head: Normocephalic.  Eyes: Conjunctivae are normal. Right eye exhibits no discharge. Left eye exhibits no discharge. No scleral icterus.  Cardiovascular: Normal rate and regular rhythm.   Respiratory: Effort normal. No respiratory distress.  Musculoskeletal:  RLE No traumatic wounds, ecchymosis, or rash  Knee and forefoot TTP  Large knee effusion  Knee stable to varus/ valgus and anterior/posterior stress, A/PROM limited and mod painful  Sens DPN, SPN, TN intact  Motor EHL, ext, flex, evers 5/5  DP 2+, PT 2+   LLE No traumatic wounds, ecchymosis, or rash  Nontender  No effusions  Knee stable to varus/ valgus and anterior/posterior stress  Sens DPN, SPN, TN intact  Motor EHL, ext, flex, evers 5/5  DP 2+, PT 2+, No significant  edema  Neurological: He is alert.  Skin: Skin is warm and dry. He is not diaphoretic.  Psychiatric: He has a normal mood and affect. His behavior is normal.    Assessment/Plan: Right knee effusion -- Suggest aspiration again, will also inject with anesthetic for symptomatic relief. Will add steroid if fluid is clear. Check x-rays. Agree with IV abx. May WBAT.    Lisette Abu, PA-C Orthopedic Surgery [phone] 05/07/2017, 2:01 PM

## 2017-05-08 DIAGNOSIS — M25461 Effusion, right knee: Secondary | ICD-10-CM

## 2017-05-08 DIAGNOSIS — F411 Generalized anxiety disorder: Secondary | ICD-10-CM

## 2017-05-08 DIAGNOSIS — I10 Essential (primary) hypertension: Secondary | ICD-10-CM

## 2017-05-08 DIAGNOSIS — M549 Dorsalgia, unspecified: Secondary | ICD-10-CM

## 2017-05-08 DIAGNOSIS — G8929 Other chronic pain: Secondary | ICD-10-CM

## 2017-05-08 DIAGNOSIS — F1010 Alcohol abuse, uncomplicated: Secondary | ICD-10-CM

## 2017-05-08 DIAGNOSIS — L03115 Cellulitis of right lower limb: Principal | ICD-10-CM

## 2017-05-08 LAB — PROTEIN, BODY FLUID (OTHER): TOTAL PROTEIN, BODY FLUID OTHER: 3.6 g/dL

## 2017-05-08 LAB — URIC ACID, BODY FLUID: Uric Acid Body Fluid: 6.6 mg/dL

## 2017-05-08 LAB — GLUCOSE, BODY FLUID OTHER: GLUCOSE, BODY FLUID OTHER: 88 mg/dL

## 2017-05-08 MED ORDER — PREDNISONE 20 MG PO TABS: 40.0000 mg | ORAL_TABLET | Freq: Every day | ORAL | 0 refills | Status: DC

## 2017-05-08 MED ORDER — BISACODYL 5 MG PO TBEC: 5.0000 mg | DELAYED_RELEASE_TABLET | Freq: Every day | ORAL | 0 refills | Status: DC | PRN

## 2017-05-08 MED ORDER — ACETAMINOPHEN 325 MG PO TABS: 650.0000 mg | ORAL_TABLET | Freq: Four times a day (QID) | ORAL | Status: DC | PRN

## 2017-05-08 MED ORDER — CEFUROXIME AXETIL 250 MG PO TABS
250.0000 mg | ORAL_TABLET | Freq: Two times a day (BID) | ORAL | 0 refills | Status: AC
Start: 2017-05-08 — End: 2017-05-18

## 2017-05-08 MED ORDER — LOSARTAN POTASSIUM 100 MG PO TABS: 100.0000 mg | ORAL_TABLET | Freq: Every day | ORAL | 0 refills | Status: DC

## 2017-05-08 MED ORDER — SENNOSIDES-DOCUSATE SODIUM 8.6-50 MG PO TABS
1.0000 | ORAL_TABLET | Freq: Every evening | ORAL | Status: DC | PRN
Start: 2017-05-08 — End: 2017-05-15

## 2017-05-08 NOTE — Progress Notes (Signed)
Patient discharge teaching given, including activity, diet, follow-up appoints, and medications. Patient verbalized understanding of all discharge instructions. IV access was d/c'd. Vitals are stable. Skin is intact except as charted in most recent assessments. Pt to be escorted out by NT, to be driven home by family. 

## 2017-05-08 NOTE — Progress Notes (Signed)
Patient ID: Mark Burns, male   DOB: 1980-12-21, 36 y.o.   MRN: [id number]   LOS: 1 day   Subjective: Thought he might have had some relief yesterday afternoon but after a few steps the knee gave way and he fell. Hurting significantly this am.   Objective: Vital signs in last 24 hours: Temp:  [98.5 F (36.9 C)-99 F (37.2 C)] 98.8 F (37.1 C) (07/25 0550) Pulse Rate:  [81-98] 98 (07/25 0550) Resp:  [17-28] 17 (07/25 0550) BP: (133-137)/(71-88) 133/82 (07/25 0550) SpO2:  [94 %-97 %] 97 % (07/25 0550) Last BM Date: 05/08/17   Laboratory  CBC  Recent Labs  05/06/17 0237 05/07/17 1013  WBC 11.2* 8.0  HGB 10.5* 10.7*  HCT 30.9* 32.0*  PLT 210 222   BMET  Recent Labs  05/06/17 0237 05/07/17 1013  NA 137 136  K 3.4* 3.8  CL 100* 98*  CO2 30 29  GLUCOSE 127* 113*  BUN 9 12  CREATININE 0.97 0.84  CALCIUM 8.5* 9.1     Ref Range & Units 1d ago 3d ago   Color, Synovial YELLOW AMBER   STRAW     Appearance-Synovial CLEAR TURBID   TURBID     Crystals, Fluid  INTRACELLULAR MONOSODIUM URATE CRYS...  NO CRYSTALS SEEN   INTRACELLULAR MONOSODIUM URATE CRYSTALS   WBC, Synovial 0 - 200 /cu mm 8,600   4,515     Neutrophil, Synovial 0 - 25 % 86   77     Lymphocytes-Synovial Fld 0 - 20 % 4  1    Monocyte-Macrophage-Synovial Fluid 50 - 90 % 10   22     Eosinophils-Synovial 0 - 1 % 0  0   Resulting Agency  [facility]       Gram stain: No organisms seen.  Physical Exam RLE No traumatic wounds, ecchymosis, or rash  TTP, severe pain with A/PROM  Effusion stable  Knee stable to varus/ valgus and anterior/posterior stress  Sens DPN, SPN, TN intact  Motor EHL, ext, flex, evers 5/5  DP 2+, PT 2+, No significant edema   Assessment/Plan: Gout -- Likely precipitated by the cellulitis. Will start colchicine and prednisone. Will leave further management to attending service. He should follow up with the provider who manages his gout as an outpatient. Orthopedic surgery will  sign off.    Lisette Abu, PA-C Orthopedic Surgery [phone] 05/08/2017

## 2017-05-08 NOTE — Discharge Summary (Addendum)
Physician Discharge Summary  Mark Burns VVO:[id number] DOB: 1981/03/13 DOA: 05/05/2017  PCP: Venia Carbon, MD  Admit date: 05/05/2017 Discharge date: 05/08/2017  Admitted From: home  Disposition:  home   Recommendations for Outpatient Follow-up:  1. PCP to decide on further preventative medication for Gout once acute attack has resolved  Home Health:  none  Equipment/Devices:  walker    Discharge Condition:  stable   CODE STATUS:  Full code   Consultations:  Orthos    Discharge Diagnoses:  Principal Problem:   Cellulitis of right leg Active Problems:   Effusion of right knee joint   Hypertension   Generalized anxiety disorder   Chronic back pain   Alcohol abuse, daily use    Subjective: Pain in right knee is not severe but he still finds it painful to walk. Right foot is much better.   Brief Summary: Mark Burns a 36 y.o.malewith medical history significant forhypertension, depression, anxiety, and daily alcohol use  presented to the emergency department for evaluation of pain, redness, and swelling involving the right foot.  Admitted for cellulitis and right knee effusion which was aspirated in the ER. Fluid analysis was      Hospital Course:  Right foot cellulitis - occurring after he dropped a phone on the same foot - has improved with Rocephin- will continue Ceftin for 7 more days  Right knee effusion and pain - described pain as excruciating - aspirated in ER- 4512 WBC- 75% neutrophils, no crystals - evaluated by ortho and aspirated again on 7/24 and Lidocaine injected- - 47 cc of dark bloody fluid- 8600 WBC, 86% neutrophils and intracellular monosodium urate crystals - started on Prednisone on 7/24 which he will continue to treat the gout flare - needs to stop HCTZ and alcohol use - should not take Indomethacin while he is on Prednisone- has been discontinued  HTN - stop HCTZ- cont Losartan  Chronic back pain - prescribed narcotics for  this   Discharge Instructions  Discharge Instructions    Diet - low sodium heart healthy    Complete by:  As directed    Increase activity slowly    Complete by:  As directed      Allergies as of 05/08/2017      Reactions   Sulfa Antibiotics Hives      Medication List    STOP taking these medications   indomethacin 50 MG capsule Commonly known as:  INDOCIN   losartan-hydrochlorothiazide 100-25 MG tablet Commonly known as:  HYZAAR     TAKE these medications   acetaminophen 325 MG tablet Commonly known as:  TYLENOL Take 2 tablets (650 mg total) by mouth every 6 (six) hours as needed for mild pain (or Fever >/= 101).   bisacodyl 5 MG EC tablet Commonly known as:  DULCOLAX Take 1 tablet (5 mg total) by mouth daily as needed for moderate constipation.   cefUROXime 250 MG tablet Commonly known as:  CEFTIN Take 1 tablet (250 mg total) by mouth 2 (two) times daily.   citalopram 40 MG tablet Commonly known as:  CELEXA TAKE 1 TABLET (40 MG TOTAL) BY MOUTH DAILY.   DULoxetine 30 MG capsule Commonly known as:  CYMBALTA Take 1 capsule (30 mg total) by mouth daily.   HYDROcodone-acetaminophen 5-325 MG tablet Commonly known as:  NORCO/VICODIN Take 1 tablet by mouth 2 (two) times daily as needed for moderate pain.   hydrocortisone 2.5 % cream APPLY TOPICALLY 3 (THREE) TIMES DAILY AS NEEDED. What changed:  See the new instructions.   losartan 100 MG tablet Commonly known as:  COZAAR Take 1 tablet (100 mg total) by mouth daily.   predniSONE 20 MG tablet Commonly known as:  DELTASONE Take 2 tablets (40 mg total) by mouth daily with breakfast.   senna-docusate 8.6-50 MG tablet Commonly known as:  Senokot-S Take 1 tablet by mouth at bedtime as needed for mild constipation.            Durable Medical Equipment        Start     Ordered   05/08/17 1112  For home use only DME Walker  Once    Question:  Patient needs a walker to treat with the following condition   Answer:  Gout   05/08/17 [facility] Follow up.   Why:  rolling walker to be delivered to bedside prior to discharge Contact information: [place] [phone]          Allergies  Allergen Reactions  . Sulfa Antibiotics Hives     Procedures/Studies: Aspiration of right knee x 2  Ct Foot Right W Contrast  Result Date: 05/05/2017 CLINICAL DATA:  Right lower extremity infection. Leg swelling from the foot to distal femur. Patient reports dropping phone on foot 6 days ago. EXAM: CT OF THE LOWER RIGHT EXTREMITY WITH CONTRAST TECHNIQUE: Multidetector CT imaging of the lower right extremity was performed according to the standard protocol following intravenous contrast administration. The right femur, tibia/fibula, and foot were image. COMPARISON:  None. CONTRAST:  152mL ISOVUE-300 IOPAMIDOL (ISOVUE-300) INJECTION 61% FINDINGS: Femur: Osseous structures: No femur fracture. No bony destructive change or periosteal reaction. Hip joint space is maintained. No hip joint effusion. There is a small to moderate knee joint effusion. There is some high-density debris within the knee joint. Soft tissues: Anterior subcutaneous soft tissue edema about the distal femur/thigh. No fluid collection. No soft tissue air. Streak artifact from the external metallic density in the mid femur. No intramuscular fluid collection. Normal fatty striations persist. No gross filling defects within the visualized venous structures to suggest DVT. Tibia/fibula: Osseous structures: Moderate knee joint effusion with some high-density intra-articular debris in the knee joint. Tibia and fibula are intact without fracture. No bony destructive change or periosteal reaction. Minimal osseous 6 gross shins from the medial aspect of the distal tibia suggests prior injury. Soft tissues: Anterior subcutaneous soft tissue edema about the lower leg. No  focal fluid collection or abscess. No soft tissue air. No intramuscular collection. Normal fatty muscle striations persists. No gross filling defects within the visualized venous structures to suggest DVT. Foot: Osseous structures: No acute fracture of the foot or ankle. No bony destructive change. There is a small ankle joint effusion. Soft tissues: Skin thickening and soft tissue edema about the foot and ankle, most prominent laterally. No focal fluid collection. No soft tissue air. IMPRESSION: 1. Soft tissue edema of the right lower extremity, most prominent about the lateral foot and ankle, extending up to the distal femur. This may be secondary to cellulitis/infection. No abscess or soft tissue air. 2. No acute osseous abnormality of the right lower extremity. 3. Moderate knee joint effusion containing high-density material, possible small osteochondral bodies. Small tibial talar joint effusion. Electronically Signed   By: Jeb Levering M.D.   On: 05/05/2017 18:43   Ct Femur Right W Contrast  Result Date: 05/05/2017 CLINICAL DATA:  Right lower extremity infection. Leg swelling from the foot to distal femur. Patient reports dropping phone on foot 6 days ago. EXAM: CT OF THE LOWER RIGHT EXTREMITY WITH CONTRAST TECHNIQUE: Multidetector CT imaging of the lower right extremity was performed according to the standard protocol following intravenous contrast administration. The right femur, tibia/fibula, and foot were image. COMPARISON:  None. CONTRAST:  183mL ISOVUE-300 IOPAMIDOL (ISOVUE-300) INJECTION 61% FINDINGS: Femur: Osseous structures: No femur fracture. No bony destructive change or periosteal reaction. Hip joint space is maintained. No hip joint effusion. There is a small to moderate knee joint effusion. There is some high-density debris within the knee joint. Soft tissues: Anterior subcutaneous soft tissue edema about the distal femur/thigh. No fluid collection. No soft tissue air. Streak artifact  from the external metallic density in the mid femur. No intramuscular fluid collection. Normal fatty striations persist. No gross filling defects within the visualized venous structures to suggest DVT. Tibia/fibula: Osseous structures: Moderate knee joint effusion with some high-density intra-articular debris in the knee joint. Tibia and fibula are intact without fracture. No bony destructive change or periosteal reaction. Minimal osseous 6 gross shins from the medial aspect of the distal tibia suggests prior injury. Soft tissues: Anterior subcutaneous soft tissue edema about the lower leg. No focal fluid collection or abscess. No soft tissue air. No intramuscular collection. Normal fatty muscle striations persists. No gross filling defects within the visualized venous structures to suggest DVT. Foot: Osseous structures: No acute fracture of the foot or ankle. No bony destructive change. There is a small ankle joint effusion. Soft tissues: Skin thickening and soft tissue edema about the foot and ankle, most prominent laterally. No focal fluid collection. No soft tissue air. IMPRESSION: 1. Soft tissue edema of the right lower extremity, most prominent about the lateral foot and ankle, extending up to the distal femur. This may be secondary to cellulitis/infection. No abscess or soft tissue air. 2. No acute osseous abnormality of the right lower extremity. 3. Moderate knee joint effusion containing high-density material, possible small osteochondral bodies. Small tibial talar joint effusion. Electronically Signed   By: Jeb Levering M.D.   On: 05/05/2017 18:43   Ct Tibia Fibula Right W Contrast  Result Date: 05/05/2017 CLINICAL DATA:  Right lower extremity infection. Leg swelling from the foot to distal femur. Patient reports dropping phone on foot 6 days ago. EXAM: CT OF THE LOWER RIGHT EXTREMITY WITH CONTRAST TECHNIQUE: Multidetector CT imaging of the lower right extremity was performed according to the  standard protocol following intravenous contrast administration. The right femur, tibia/fibula, and foot were image. COMPARISON:  None. CONTRAST:  165mL ISOVUE-300 IOPAMIDOL (ISOVUE-300) INJECTION 61% FINDINGS: Femur: Osseous structures: No femur fracture. No bony destructive change or periosteal reaction. Hip joint space is maintained. No hip joint effusion. There is a small to moderate knee joint effusion. There is some high-density debris within the knee joint. Soft tissues: Anterior subcutaneous soft tissue edema about the distal femur/thigh. No fluid collection. No soft tissue air. Streak artifact from the external metallic density in the mid femur. No intramuscular fluid collection. Normal fatty striations persist. No gross filling defects within the visualized venous structures to suggest DVT. Tibia/fibula: Osseous structures: Moderate knee joint effusion with some high-density intra-articular debris in the knee joint. Tibia and fibula are intact without fracture. No bony destructive change or periosteal reaction. Minimal osseous 6 gross shins from the medial aspect of the distal tibia suggests prior injury. Soft tissues: Anterior subcutaneous soft tissue edema about the  lower leg. No focal fluid collection or abscess. No soft tissue air. No intramuscular collection. Normal fatty muscle striations persists. No gross filling defects within the visualized venous structures to suggest DVT. Foot: Osseous structures: No acute fracture of the foot or ankle. No bony destructive change. There is a small ankle joint effusion. Soft tissues: Skin thickening and soft tissue edema about the foot and ankle, most prominent laterally. No focal fluid collection. No soft tissue air. IMPRESSION: 1. Soft tissue edema of the right lower extremity, most prominent about the lateral foot and ankle, extending up to the distal femur. This may be secondary to cellulitis/infection. No abscess or soft tissue air. 2. No acute osseous  abnormality of the right lower extremity. 3. Moderate knee joint effusion containing high-density material, possible small osteochondral bodies. Small tibial talar joint effusion. Electronically Signed   By: Jeb Levering M.D.   On: 05/05/2017 18:43       Discharge Exam: Vitals:   05/08/17 0007 05/08/17 0550  BP: 133/86 133/82  Pulse: 81 98  Resp: 17 17  Temp: 98.5 F (36.9 C) 98.8 F (37.1 C)   Vitals:   05/07/17 1256 05/07/17 1959 05/08/17 0007 05/08/17 0550  BP: 137/88 133/71 133/86 133/82  Pulse: 85 81 81 98  Resp: (!) 28 20 17 17   Temp: 99 F (37.2 C) 98.7 F (37.1 C) 98.5 F (36.9 C) 98.8 F (37.1 C)  TempSrc: Oral     SpO2: 94% 95% 96% 97%  Weight:      Height:        General: Pt is alert, awake, not in acute distress Cardiovascular: RRR, S1/S2 +, no rubs, no gallops Respiratory: CTA bilaterally, no wheezing, no rhonchi Abdominal: Soft, NT, ND, bowel sounds + Extremities: right knee is moderately swollen, mildly erythematous and tender to the touch, no swelling, erythema or tenderness in right foot   - The results of significant diagnostics from this hospitalization (including imaging, microbiology, ancillary and laboratory) are listed below for reference.     Microbiology: Recent Results (from the past 240 hour(s))  Culture, blood (Routine X 2) w Reflex to ID Panel     Status: None (Preliminary result)   Collection Time: 05/05/17  4:00 PM  Result Value Ref Range Status   Specimen Description BLOOD RIGHT ANTECUBITAL  Final   Special Requests   Final    BOTTLES DRAWN AEROBIC AND ANAEROBIC Blood Culture adequate volume   Culture NO GROWTH 3 DAYS  Final   Report Status PENDING  Incomplete  Culture, blood (Routine X 2) w Reflex to ID Panel     Status: None (Preliminary result)   Collection Time: 05/05/17  4:10 PM  Result Value Ref Range Status   Specimen Description BLOOD RIGHT ARM  Final   Special Requests   Final    BOTTLES DRAWN AEROBIC ONLY Blood  Culture adequate volume   Culture NO GROWTH 3 DAYS  Final   Report Status PENDING  Incomplete  Gram stain     Status: None   Collection Time: 05/05/17  8:27 PM  Result Value Ref Range Status   Specimen Description FLUID SYNOVIAL RIGHT KNEE  Final   Special Requests Normal  Final   Gram Stain   Final    MODERATE WBC PRESENT, PREDOMINANTLY PMN NO ORGANISMS SEEN    Report Status 05/05/2017 FINAL  Final  Culture, body fluid-bottle     Status: None (Preliminary result)   Collection Time: 05/05/17  8:27 PM  Result Value  Ref Range Status   Specimen Description FLUID SYNOVIAL RIGHT KNEE  Final   Special Requests   Final    BOTTLES DRAWN AEROBIC AND ANAEROBIC Blood Culture adequate volume   Culture NO GROWTH 3 DAYS  Final   Report Status PENDING  Incomplete  Stat Gram stain     Status: None   Collection Time: 05/07/17  3:03 PM  Result Value Ref Range Status   Specimen Description FLUID SYNOVIAL RIGHT KNEE  Final   Special Requests NONE  Final   Gram Stain   Final    ABUNDANT WBC PRESENT, PREDOMINANTLY PMN NO ORGANISMS SEEN    Report Status 05/07/2017 FINAL  Final     Labs: BNP (last 3 results) No results for input(s): BNP in the last 8760 hours. Basic Metabolic Panel:  Recent Labs Lab 05/05/17 1339 05/06/17 0237 05/07/17 1013  NA 135 137 136  K 4.3 3.4* 3.8  CL 98* 100* 98*  CO2 26 30 29   GLUCOSE 91 127* 113*  BUN 16 9 12   CREATININE 0.89 0.97 0.84  CALCIUM 9.4 8.5* 9.1   Liver Function Tests: No results for input(s): AST, ALT, ALKPHOS, BILITOT, PROT, ALBUMIN in the last 168 hours. No results for input(s): LIPASE, AMYLASE in the last 168 hours. No results for input(s): AMMONIA in the last 168 hours. CBC:  Recent Labs Lab 05/05/17 1339 05/06/17 0237 05/07/17 1013  WBC 12.9* 11.2* 8.0  NEUTROABS 8.2* 7.0  --   HGB 13.4 10.5* 10.7*  HCT 38.1* 30.9* 32.0*  MCV 86.2 87.3 88.2  PLT 234 210 222   Cardiac Enzymes:  Recent Labs Lab 05/05/17 1610  CKTOTAL 91    BNP: Invalid input(s): POCBNP CBG: No results for input(s): GLUCAP in the last 168 hours. D-Dimer No results for input(s): DDIMER in the last 72 hours. Hgb A1c No results for input(s): HGBA1C in the last 72 hours. Lipid Profile No results for input(s): CHOL, HDL, LDLCALC, TRIG, CHOLHDL, LDLDIRECT in the last 72 hours. Thyroid function studies No results for input(s): TSH, T4TOTAL, T3FREE, THYROIDAB in the last 72 hours.  Invalid input(s): FREET3 Anemia work up No results for input(s): VITAMINB12, FOLATE, FERRITIN, TIBC, IRON, RETICCTPCT in the last 72 hours. Urinalysis    Component Value Date/Time   COLORURINE YELLOW 01/10/2012 1455   APPEARANCEUR CLEAR 01/10/2012 1455   LABSPEC 1.024 01/10/2012 1455   PHURINE 5.5 01/10/2012 1455   GLUCOSEU NEGATIVE 01/10/2012 1455   HGBUR MODERATE (A) 01/10/2012 1455   BILIRUBINUR NEGATIVE 01/10/2012 1455   KETONESUR NEGATIVE 01/10/2012 1455   PROTEINUR 30 (A) 01/10/2012 1455   UROBILINOGEN 0.2 01/10/2012 1455   NITRITE NEGATIVE 01/10/2012 1455   LEUKOCYTESUR NEGATIVE 01/10/2012 1455   Sepsis Labs Invalid input(s): PROCALCITONIN,  WBC,  LACTICIDVEN Microbiology Recent Results (from the past 240 hour(s))  Culture, blood (Routine X 2) w Reflex to ID Panel     Status: None (Preliminary result)   Collection Time: 05/05/17  4:00 PM  Result Value Ref Range Status   Specimen Description BLOOD RIGHT ANTECUBITAL  Final   Special Requests   Final    BOTTLES DRAWN AEROBIC AND ANAEROBIC Blood Culture adequate volume   Culture NO GROWTH 3 DAYS  Final   Report Status PENDING  Incomplete  Culture, blood (Routine X 2) w Reflex to ID Panel     Status: None (Preliminary result)   Collection Time: 05/05/17  4:10 PM  Result Value Ref Range Status   Specimen Description BLOOD RIGHT ARM  Final  Special Requests   Final    BOTTLES DRAWN AEROBIC ONLY Blood Culture adequate volume   Culture NO GROWTH 3 DAYS  Final   Report Status PENDING  Incomplete   Gram stain     Status: None   Collection Time: 05/05/17  8:27 PM  Result Value Ref Range Status   Specimen Description FLUID SYNOVIAL RIGHT KNEE  Final   Special Requests Normal  Final   Gram Stain   Final    MODERATE WBC PRESENT, PREDOMINANTLY PMN NO ORGANISMS SEEN    Report Status 05/05/2017 FINAL  Final  Culture, body fluid-bottle     Status: None (Preliminary result)   Collection Time: 05/05/17  8:27 PM  Result Value Ref Range Status   Specimen Description FLUID SYNOVIAL RIGHT KNEE  Final   Special Requests   Final    BOTTLES DRAWN AEROBIC AND ANAEROBIC Blood Culture adequate volume   Culture NO GROWTH 3 DAYS  Final   Report Status PENDING  Incomplete  Stat Gram stain     Status: None   Collection Time: 05/07/17  3:03 PM  Result Value Ref Range Status   Specimen Description FLUID SYNOVIAL RIGHT KNEE  Final   Special Requests NONE  Final   Gram Stain   Final    ABUNDANT WBC PRESENT, PREDOMINANTLY PMN NO ORGANISMS SEEN    Report Status 05/07/2017 FINAL  Final     Time coordinating discharge: Over 30 minutes  SIGNED:   Debbe Odea, MD  [facility] 05/08/2017, 1:05 PM Pager   If 7PM-7AM, please contact night-coverage [email or web address] Password [email or web address]

## 2017-05-08 NOTE — Discharge Instructions (Signed)
Alcohol can cause gout attacks. Try to quit drinking or at least cut back.  Please take all your medications with you for your next visit with your Primary MD. Please request your Primary MD to go over all hospital test results at the follow up. Please ask your Primary MD to get all Hospital records sent to his/her office.  If you experience worsening of your admission symptoms, develop shortness of breath, chest pain, suicidal or homicidal thoughts or a life threatening emergency, you must seek medical attention immediately by calling 911 or calling your MD.  Dennis Bast must read the complete instructions/literature along with all the possible adverse reactions/side effects for all the medicines you take including new medications that have been prescribed to you. Take new medicines after you have completely understood and accpet all the possible adverse reactions/side effects.   Do not drive when taking pain medications or sedatives.    Do not take more than prescribed Pain, Sleep and Anxiety Medications  If you have smoked or chewed Tobacco in the last 2 yrs please stop. Stop any regular alcohol and or recreational drug use.  Wear Seat belts while driving.

## 2017-05-09 ENCOUNTER — Encounter: Payer: Self-pay | Admitting: Internal Medicine

## 2017-05-10 ENCOUNTER — Telehealth: Payer: Self-pay

## 2017-05-10 LAB — CULTURE, BLOOD (ROUTINE X 2)
Culture: NO GROWTH
Culture: NO GROWTH
SPECIAL REQUESTS: ADEQUATE
SPECIAL REQUESTS: ADEQUATE

## 2017-05-10 LAB — CULTURE, BODY FLUID W GRAM STAIN -BOTTLE
Culture: NO GROWTH
Special Requests: ADEQUATE

## 2017-05-10 LAB — CULTURE, BODY FLUID-BOTTLE

## 2017-05-10 NOTE — Telephone Encounter (Signed)
Left message to call office to see how he is doing and to have him make his hospital f/u

## 2017-05-11 ENCOUNTER — Other Ambulatory Visit: Payer: Self-pay | Admitting: Internal Medicine

## 2017-05-13 ENCOUNTER — Encounter: Payer: Self-pay | Admitting: Internal Medicine

## 2017-05-13 MED ORDER — HYDROCODONE-ACETAMINOPHEN 5-325 MG PO TABS: 1.0000 | ORAL_TABLET | Freq: Two times a day (BID) | ORAL | 0 refills | Status: DC | PRN

## 2017-05-13 MED ORDER — INDOMETHACIN 50 MG PO CAPS: 50.0000 mg | ORAL_CAPSULE | Freq: Two times a day (BID) | ORAL | 0 refills | Status: DC

## 2017-05-13 NOTE — Telephone Encounter (Signed)
Last written 04-11-17 #60 Last OV 02-19-17 No OV for Hospital F/U, yet. Message left for pt last Friday. Last UDS 01-14-17

## 2017-05-13 NOTE — Telephone Encounter (Signed)
Hydrocodone last printed 04-11-17 #60 Last OV 02-19-17 No Future OV [facility] F/U) Last UDS 01-14-17  Dr Damita Dunnings had recommended he not take the Indocin due to being on prednisone recently. Do you want to refill it?

## 2017-05-13 NOTE — Telephone Encounter (Signed)
Okay to refill the indocin #30 x 0

## 2017-05-15 ENCOUNTER — Ambulatory Visit (INDEPENDENT_AMBULATORY_CARE_PROVIDER_SITE_OTHER): Payer: BC Managed Care – PPO | Admitting: Internal Medicine

## 2017-05-15 ENCOUNTER — Encounter: Payer: Self-pay | Admitting: Internal Medicine

## 2017-05-15 VITALS — BP 118/84 | HR 78 | Temp 98.2°F | Wt 256.0 lb

## 2017-05-15 DIAGNOSIS — M549 Dorsalgia, unspecified: Secondary | ICD-10-CM | POA: Diagnosis not present

## 2017-05-15 DIAGNOSIS — L03119 Cellulitis of unspecified part of limb: Secondary | ICD-10-CM | POA: Diagnosis not present

## 2017-05-15 DIAGNOSIS — M109 Gout, unspecified: Secondary | ICD-10-CM | POA: Insufficient documentation

## 2017-05-15 DIAGNOSIS — I10 Essential (primary) hypertension: Secondary | ICD-10-CM

## 2017-05-15 DIAGNOSIS — G8929 Other chronic pain: Secondary | ICD-10-CM

## 2017-05-15 MED ORDER — COLCHICINE 0.6 MG PO TABS: 0.6000 mg | ORAL_TABLET | Freq: Two times a day (BID) | ORAL | 3 refills | Status: DC | PRN

## 2017-05-15 NOTE — Assessment & Plan Note (Signed)
Indocin has helped Will give Rx for colchicine as well Check uric acid level

## 2017-05-15 NOTE — Assessment & Plan Note (Signed)
Continues on the hydrocodone CSRS checked --no Rx other than here

## 2017-05-15 NOTE — Assessment & Plan Note (Signed)
BP Readings from Last 3 Encounters:  05/15/17 118/84  05/08/17 133/82  05/05/17 120/77   Control okay Properly now off the HCTZ due to gout

## 2017-05-15 NOTE — Assessment & Plan Note (Signed)
And calf He showed me pictures of severe swelling in entire calf Likely started as gout---but clearly appropriate to treat for possible infection Finishing out the cefuroxime

## 2017-05-15 NOTE — Progress Notes (Signed)
   Subjective:    Patient ID: Mark Burns, male    DOB: October 30, 1980, 36 y.o.   MRN: [id number]  HPI Here for hospital follow up  Had dropped phone on his foot--around 2 weeks ago Corner hit his skin Noted increasing pain in the foot while jogging Then pain got really severe---hydrocodone didn't help  To ER due to severity Felt feverish--but no recorded fever Some chills  Swelling in right knee followed the foot pain Aspiration confirmed gout Treated with antibiotic and prednisone Indocin has helped since then  Current Outpatient Prescriptions on File Prior to Visit  Medication Sig Dispense Refill  . acetaminophen (TYLENOL) 325 MG tablet Take 2 tablets (650 mg total) by mouth every 6 (six) hours as needed for mild pain (or Fever >/= 101).    . cefUROXime (CEFTIN) 250 MG tablet Take 1 tablet (250 mg total) by mouth 2 (two) times daily. 14 tablet 0  . citalopram (CELEXA) 40 MG tablet TAKE 1 TABLET (40 MG TOTAL) BY MOUTH DAILY. 90 tablet 1  . DULoxetine (CYMBALTA) 30 MG capsule Take 1 capsule (30 mg total) by mouth daily. 30 capsule 3  . HYDROcodone-acetaminophen (NORCO/VICODIN) 5-325 MG tablet Take 1 tablet by mouth 2 (two) times daily as needed for moderate pain. 60 tablet 0  . hydrocortisone 2.5 % cream APPLY TOPICALLY 3 (THREE) TIMES DAILY AS NEEDED. (Patient taking differently: Apply 1 application three times a day to affected site as needed for itching) 453.6 g 3  . indomethacin (INDOCIN) 50 MG capsule Take 1 capsule (50 mg total) by mouth 2 (two) times daily with a meal. 30 capsule 0  . losartan (COZAAR) 100 MG tablet Take 1 tablet (100 mg total) by mouth daily. 30 tablet 0   No current facility-administered medications on file prior to visit.     Allergies  Allergen Reactions  . Sulfa Antibiotics Hives    Past Medical History:  Diagnosis Date  . ADHD, predominantly inattentive type   . Elevated LFTs 11/19/2013  . Erectile dysfunction   . Generalized anxiety disorder   .  Gout   . Hypertension   . Recurrent cold sores   . Seborrheic dermatitis     Past Surgical History:  Procedure Laterality Date  . VASECTOMY  2014    No family history on file.  Social History   Social History  . Marital status: Married    Spouse name: N/A  . Number of children: 2  . Years of education: N/A   Occupational History  . PE teacher     [employer]   Social History Main Topics  . Smoking status: Never Smoker  . Smokeless tobacco: Never Used  . Alcohol use Yes     Comment: daily use   . Drug use: No  . Sexual activity: Not on file   Other Topics Concern  . Not on file   Social History Narrative   Played college football, married to "Judson Roch."    Review of Systems  Taken off HCTZ--BP fine No GI symptoms Bowels more frequent     Objective:   Physical Exam  Musculoskeletal:  No clear cut right knee effusion No redness or tenderness  Skin:  Slight flaking by 5th proximal metatarsal on right--no redness Slightly tender          Assessment & Plan:

## 2017-05-15 NOTE — Addendum Note (Signed)
Addended by: Ellamae Sia on: 05/15/2017 11:58 AM   Modules accepted: Orders

## 2017-05-16 ENCOUNTER — Other Ambulatory Visit: Payer: Self-pay

## 2017-06-01 ENCOUNTER — Other Ambulatory Visit: Payer: Self-pay | Admitting: Internal Medicine

## 2017-06-04 ENCOUNTER — Encounter: Payer: Self-pay | Admitting: Internal Medicine

## 2017-06-07 ENCOUNTER — Other Ambulatory Visit: Payer: BC Managed Care – PPO

## 2017-06-13 ENCOUNTER — Other Ambulatory Visit (INDEPENDENT_AMBULATORY_CARE_PROVIDER_SITE_OTHER): Payer: BC Managed Care – PPO

## 2017-06-13 ENCOUNTER — Other Ambulatory Visit: Payer: Self-pay | Admitting: Internal Medicine

## 2017-06-13 DIAGNOSIS — I10 Essential (primary) hypertension: Secondary | ICD-10-CM | POA: Diagnosis not present

## 2017-06-13 DIAGNOSIS — M109 Gout, unspecified: Secondary | ICD-10-CM | POA: Diagnosis not present

## 2017-06-13 LAB — URIC ACID: Uric Acid, Serum: 8.7 mg/dL — ABNORMAL HIGH (ref 4.0–7.8)

## 2017-06-13 LAB — RENAL FUNCTION PANEL
Albumin: 4.6 g/dL (ref 3.5–5.2)
BUN: 17 mg/dL (ref 6–23)
CO2: 30 meq/L (ref 19–32)
Calcium: 9.7 mg/dL (ref 8.4–10.5)
Chloride: 104 meq/L (ref 96–112)
Creatinine, Ser: 0.93 mg/dL (ref 0.40–1.50)
GFR: 96.65 mL/min
Glucose, Bld: 87 mg/dL (ref 70–99)
Phosphorus: 3.3 mg/dL (ref 2.3–4.6)
Potassium: 3.9 meq/L (ref 3.5–5.1)
Sodium: 141 meq/L (ref 135–145)

## 2017-06-13 MED ORDER — HYDROCODONE-ACETAMINOPHEN 5-325 MG PO TABS: 1.0000 | ORAL_TABLET | Freq: Two times a day (BID) | ORAL | 0 refills | Status: DC | PRN

## 2017-06-13 NOTE — Telephone Encounter (Signed)
Last filled 05-15-17 #60 Last OV 05-15-17 No Future OV  Last UDS/CSA 01-14-17 CSRS 05-15-17

## 2017-06-20 ENCOUNTER — Encounter: Payer: Self-pay | Admitting: Internal Medicine

## 2017-06-20 DIAGNOSIS — F411 Generalized anxiety disorder: Secondary | ICD-10-CM

## 2017-06-21 ENCOUNTER — Encounter: Payer: Self-pay | Admitting: Internal Medicine

## 2017-07-01 ENCOUNTER — Encounter: Payer: Self-pay | Admitting: Primary Care

## 2017-07-01 ENCOUNTER — Ambulatory Visit (INDEPENDENT_AMBULATORY_CARE_PROVIDER_SITE_OTHER): Payer: BC Managed Care – PPO | Admitting: Primary Care

## 2017-07-01 VITALS — BP 142/82 | HR 90 | Temp 98.6°F | Wt 255.8 lb

## 2017-07-01 DIAGNOSIS — J069 Acute upper respiratory infection, unspecified: Secondary | ICD-10-CM | POA: Diagnosis not present

## 2017-07-01 NOTE — Patient Instructions (Signed)
Your symptoms are representative of a viral illness which will resolve on its own over time. Our goal is to treat your symptoms in order to aid your body in the healing process and to make you more comfortable.   Try Delsym or Robitussin as needed for cough.  Nasal Congestion/Sinus/Ear Pressure: Try using Flonase (fluticasone) nasal spray. Instill 1 spray in each nostril twice daily.   Start Ibuprofen 600 mg three times daily as needed for headaches.  It was a pleasure meeting you!   Upper Respiratory Infection, Adult Most upper respiratory infections (URIs) are a viral infection of the air passages leading to the lungs. A URI affects the nose, throat, and upper air passages. The most common type of URI is nasopharyngitis and is typically referred to as "the common cold." URIs run their course and usually go away on their own. Most of the time, a URI does not require medical attention, but sometimes a bacterial infection in the upper airways can follow a viral infection. This is called a secondary infection. Sinus and middle ear infections are common types of secondary upper respiratory infections. Bacterial pneumonia can also complicate a URI. A URI can worsen asthma and chronic obstructive pulmonary disease (COPD). Sometimes, these complications can require emergency medical care and may be life threatening. What are the causes? Almost all URIs are caused by viruses. A virus is a type of germ and can spread from one person to another. What increases the risk? You may be at risk for a URI if:  You smoke.  You have chronic heart or lung disease.  You have a weakened defense (immune) system.  You are very young or very old.  You have nasal allergies or asthma.  You work in crowded or poorly ventilated areas.  You work in health care facilities or schools.  What are the signs or symptoms? Symptoms typically develop 2-3 days after you come in contact with a cold virus. Most viral URIs  last 7-10 days. However, viral URIs from the influenza virus (flu virus) can last 14-18 days and are typically more severe. Symptoms may include:  Runny or stuffy (congested) nose.  Sneezing.  Cough.  Sore throat.  Headache.  Fatigue.  Fever.  Loss of appetite.  Pain in your forehead, behind your eyes, and over your cheekbones (sinus pain).  Muscle aches.  How is this diagnosed? Your health care provider may diagnose a URI by:  Physical exam.  Tests to check that your symptoms are not due to another condition such as: ? Strep throat. ? Sinusitis. ? Pneumonia. ? Asthma.  How is this treated? A URI goes away on its own with time. It cannot be cured with medicines, but medicines may be prescribed or recommended to relieve symptoms. Medicines may help:  Reduce your fever.  Reduce your cough.  Relieve nasal congestion.  Follow these instructions at home:  Take medicines only as directed by your health care provider.  Gargle warm saltwater or take cough drops to comfort your throat as directed by your health care provider.  Use a warm mist humidifier or inhale steam from a shower to increase air moisture. This may make it easier to breathe.  Drink enough fluid to keep your urine clear or pale yellow.  Eat soups and other clear broths and maintain good nutrition.  Rest as needed.  Return to work when your temperature has returned to normal or as your health care provider advises. You may need to stay home longer  to avoid infecting others. You can also use a face mask and careful hand washing to prevent spread of the virus.  Increase the usage of your inhaler if you have asthma.  Do not use any tobacco products, including cigarettes, chewing tobacco, or electronic cigarettes. If you need help quitting, ask your health care provider. How is this prevented? The best way to protect yourself from getting a cold is to practice good hygiene.  Avoid oral or hand  contact with people with cold symptoms.  Wash your hands often if contact occurs.  There is no clear evidence that vitamin C, vitamin E, echinacea, or exercise reduces the chance of developing a cold. However, it is always recommended to get plenty of rest, exercise, and practice good nutrition. Contact a health care provider if:  You are getting worse rather than better.  Your symptoms are not controlled by medicine.  You have chills.  You have worsening shortness of breath.  You have brown or red mucus.  You have yellow or brown nasal discharge.  You have pain in your face, especially when you bend forward.  You have a fever.  You have swollen neck glands.  You have pain while swallowing.  You have white areas in the back of your throat. Get help right away if:  You have severe or persistent: ? Headache. ? Ear pain. ? Sinus pain. ? Chest pain.  You have chronic lung disease and any of the following: ? Wheezing. ? Prolonged cough. ? Coughing up blood. ? A change in your usual mucus.  You have a stiff neck.  You have changes in your: ? Vision. ? Hearing. ? Thinking. ? Mood. This information is not intended to replace advice given to you by your health care provider. Make sure you discuss any questions you have with your health care provider. Document Released: 03/27/2001 Document Revised: 06/03/2016 Document Reviewed: 01/06/2014 Elsevier Interactive Patient Education  2017 [employer].

## 2017-07-01 NOTE — Progress Notes (Signed)
Subjective:    Patient ID: Mark Burns, male    DOB: 02-04-1981, 36 y.o.   MRN: [id number]  HPI  Mark Burns is a 36 year old male who presents today with a chief complaint of cough. He also reports sore throat, headache, nasal congestion, sinus pressure. His symptoms began two days ago. He's not taken anything OTC for his symptoms. He denies fevers, vomiting, diarrhea, constipation, abdominal pain, sick contacts.    Review of Systems  Constitutional: Positive for chills and fatigue. Negative for unexpected weight change.  HENT: Positive for congestion and sore throat.   Respiratory: Positive for cough. Negative for shortness of breath.   Gastrointestinal: Positive for nausea.       Past Medical History:  Diagnosis Date  . ADHD, predominantly inattentive type   . Elevated LFTs 11/19/2013  . Erectile dysfunction   . Generalized anxiety disorder   . Gout   . Hypertension   . Recurrent cold sores   . Seborrheic dermatitis      Social History   Social History  . Marital status: Married    Spouse name: N/A  . Number of children: 2  . Years of education: N/A   Occupational History  . PE teacher     [employer]   Social History Main Topics  . Smoking status: Never Smoker  . Smokeless tobacco: Never Used  . Alcohol use Yes     Comment: daily use   . Drug use: No  . Sexual activity: Not on file   Other Topics Concern  . Not on file   Social History Narrative   Played college football, married to "Sarah."     Past Surgical History:  Procedure Laterality Date  . VASECTOMY  2014    No family history on file.  Allergies  Allergen Reactions  . Sulfa Antibiotics Hives    Current Outpatient Prescriptions on File Prior to Visit  Medication Sig Dispense Refill  . acetaminophen (TYLENOL) 325 MG tablet Take 2 tablets (650 mg total) by mouth every 6 (six) hours as needed for mild pain (or Fever >/= 101).    . citalopram (CELEXA) 40 MG tablet TAKE 1  TABLET (40 MG TOTAL) BY MOUTH DAILY. 90 tablet 1  . colchicine 0.6 MG tablet Take 1 tablet (0.6 mg total) by mouth 2 (two) times daily as needed. For gout flares 60 tablet 3  . DULoxetine (CYMBALTA) 30 MG capsule Take 1 capsule (30 mg total) by mouth daily. 30 capsule 3  . HYDROcodone-acetaminophen (NORCO/VICODIN) 5-325 MG tablet Take 1 tablet by mouth 2 (two) times daily as needed for moderate pain. 60 tablet 0  . hydrocortisone 2.5 % cream APPLY TOPICALLY 3 (THREE) TIMES DAILY AS NEEDED. (Patient taking differently: Apply 1 application three times a day to affected site as needed for itching) 453.6 g 3  . indomethacin (INDOCIN) 50 MG capsule TAKE 1 CAPSULE BY MOUTH TWICE A DAY WITH A MEAL 30 capsule 0  . losartan (COZAAR) 100 MG tablet Take 1 tablet (100 mg total) by mouth daily. 30 tablet 0   No current facility-administered medications on file prior to visit.     BP (!) 142/82   Pulse 90   Temp 98.6 F (37 C) (Oral)   Wt 255 lb 12.8 oz (116 kg)   SpO2 98%   BMI 32.84 kg/m    Objective:   Physical Exam  Constitutional: He appears well-nourished. He does not appear ill.  HENT:  Right Ear:  Tympanic membrane and ear canal normal.  Left Ear: Tympanic membrane and ear canal normal.  Nose: No mucosal edema. Right sinus exhibits maxillary sinus tenderness and frontal sinus tenderness. Left sinus exhibits maxillary sinus tenderness and frontal sinus tenderness.  Mouth/Throat: Oropharynx is clear and moist.  Eyes: Conjunctivae are normal.  Neck: Neck supple.  Cardiovascular: Normal rate and regular rhythm.   Pulmonary/Chest: Effort normal and breath sounds normal. He has no wheezes. He has no rales.  Skin: Skin is warm and dry.          Assessment & Plan:  URI vs Allergic Rhinitis:  Cough, sinus pressure, headaches, chills x 2 days. Exam today very reassuring, does not appear sickly, lungs clear.  Suspect viral/allergy involvement and will treat with conservative  measures. Discussed use of Flonase, Delsym, Ibuprofen, fluids. He will notify if no improvement in 1 week.  Sheral Flow, NP

## 2017-07-02 ENCOUNTER — Inpatient Hospital Stay
Admission: EM | Admit: 2017-07-02 | Discharge: 2017-07-05 | DRG: 840 | Disposition: A | Discharge status: Other Acute Inpatient Hospital | Payer: BC Managed Care – PPO | Attending: Internal Medicine | Admitting: Internal Medicine

## 2017-07-02 ENCOUNTER — Emergency Department: Payer: BC Managed Care – PPO

## 2017-07-02 ENCOUNTER — Encounter: Payer: Self-pay | Admitting: Emergency Medicine

## 2017-07-02 DIAGNOSIS — C8310 Mantle cell lymphoma, unspecified site: Secondary | ICD-10-CM | POA: Diagnosis present

## 2017-07-02 DIAGNOSIS — C8510 Unspecified B-cell lymphoma, unspecified site: Secondary | ICD-10-CM | POA: Diagnosis not present

## 2017-07-02 DIAGNOSIS — Z882 Allergy status to sulfonamides status: Secondary | ICD-10-CM | POA: Diagnosis not present

## 2017-07-02 DIAGNOSIS — N179 Acute kidney failure, unspecified: Secondary | ICD-10-CM | POA: Diagnosis present

## 2017-07-02 DIAGNOSIS — R161 Splenomegaly, not elsewhere classified: Secondary | ICD-10-CM | POA: Diagnosis present

## 2017-07-02 DIAGNOSIS — F900 Attention-deficit hyperactivity disorder, predominantly inattentive type: Secondary | ICD-10-CM | POA: Diagnosis present

## 2017-07-02 DIAGNOSIS — F329 Major depressive disorder, single episode, unspecified: Secondary | ICD-10-CM | POA: Diagnosis not present

## 2017-07-02 DIAGNOSIS — Z79899 Other long term (current) drug therapy: Secondary | ICD-10-CM

## 2017-07-02 DIAGNOSIS — D696 Thrombocytopenia, unspecified: Secondary | ICD-10-CM | POA: Diagnosis not present

## 2017-07-02 DIAGNOSIS — S36039A Unspecified laceration of spleen, initial encounter: Secondary | ICD-10-CM | POA: Diagnosis not present

## 2017-07-02 DIAGNOSIS — R7989 Other specified abnormal findings of blood chemistry: Secondary | ICD-10-CM | POA: Diagnosis not present

## 2017-07-02 DIAGNOSIS — E876 Hypokalemia: Secondary | ICD-10-CM | POA: Diagnosis present

## 2017-07-02 DIAGNOSIS — E883 Tumor lysis syndrome: Secondary | ICD-10-CM | POA: Diagnosis present

## 2017-07-02 DIAGNOSIS — R7401 Elevation of levels of liver transaminase levels: Secondary | ICD-10-CM

## 2017-07-02 DIAGNOSIS — R590 Localized enlarged lymph nodes: Secondary | ICD-10-CM | POA: Diagnosis not present

## 2017-07-02 DIAGNOSIS — R0600 Dyspnea, unspecified: Secondary | ICD-10-CM | POA: Diagnosis not present

## 2017-07-02 DIAGNOSIS — R0602 Shortness of breath: Secondary | ICD-10-CM | POA: Diagnosis present

## 2017-07-02 DIAGNOSIS — F1010 Alcohol abuse, uncomplicated: Secondary | ICD-10-CM | POA: Diagnosis not present

## 2017-07-02 DIAGNOSIS — I10 Essential (primary) hypertension: Secondary | ICD-10-CM | POA: Diagnosis present

## 2017-07-02 DIAGNOSIS — R740 Nonspecific elevation of levels of transaminase and lactic acid dehydrogenase [LDH]: Secondary | ICD-10-CM

## 2017-07-02 DIAGNOSIS — Z79891 Long term (current) use of opiate analgesic: Secondary | ICD-10-CM | POA: Diagnosis not present

## 2017-07-02 DIAGNOSIS — R591 Generalized enlarged lymph nodes: Secondary | ICD-10-CM | POA: Diagnosis not present

## 2017-07-02 DIAGNOSIS — D72829 Elevated white blood cell count, unspecified: Secondary | ICD-10-CM | POA: Diagnosis not present

## 2017-07-02 DIAGNOSIS — M109 Gout, unspecified: Secondary | ICD-10-CM | POA: Diagnosis present

## 2017-07-02 DIAGNOSIS — R948 Abnormal results of function studies of other organs and systems: Secondary | ICD-10-CM | POA: Diagnosis not present

## 2017-07-02 DIAGNOSIS — X58XXXA Exposure to other specified factors, initial encounter: Secondary | ICD-10-CM | POA: Diagnosis present

## 2017-07-02 DIAGNOSIS — R1012 Left upper quadrant pain: Secondary | ICD-10-CM | POA: Diagnosis not present

## 2017-07-02 DIAGNOSIS — F411 Generalized anxiety disorder: Secondary | ICD-10-CM | POA: Diagnosis present

## 2017-07-02 DIAGNOSIS — N529 Male erectile dysfunction, unspecified: Secondary | ICD-10-CM | POA: Diagnosis not present

## 2017-07-02 DIAGNOSIS — E790 Hyperuricemia without signs of inflammatory arthritis and tophaceous disease: Secondary | ICD-10-CM | POA: Diagnosis not present

## 2017-07-02 DIAGNOSIS — K661 Hemoperitoneum: Secondary | ICD-10-CM | POA: Diagnosis present

## 2017-07-02 DIAGNOSIS — F909 Attention-deficit hyperactivity disorder, unspecified type: Secondary | ICD-10-CM | POA: Diagnosis not present

## 2017-07-02 DIAGNOSIS — F419 Anxiety disorder, unspecified: Secondary | ICD-10-CM | POA: Diagnosis not present

## 2017-07-02 LAB — CBC WITH DIFFERENTIAL/PLATELET
BAND NEUTROPHILS: 3 %
BASOS ABS: 0 10*3/uL (ref 0–0.1)
BLASTS: 0 %
Basophils Relative: 0 %
Eosinophils Absolute: 0 10*3/uL (ref 0–0.7)
Eosinophils Relative: 0 %
HCT: 42 % (ref 40.0–52.0)
HEMOGLOBIN: 14.5 g/dL (ref 13.0–18.0)
LYMPHS ABS: 14.5 10*3/uL — AB (ref 1.0–3.6)
Lymphocytes Relative: 35 %
MCH: 28.5 pg (ref 26.0–34.0)
MCHC: 34.6 g/dL (ref 32.0–36.0)
MCV: 82.4 fL (ref 80.0–100.0)
METAMYELOCYTES PCT: 1 %
MONOS PCT: 8 %
Monocytes Absolute: 3.3 10*3/uL — ABNORMAL HIGH (ref 0.2–1.0)
Myelocytes: 0 %
NEUTROS ABS: 21.9 10*3/uL — AB (ref 1.4–6.5)
Neutrophils Relative %: 49 %
OTHER: 4 %
Platelets: 89 10*3/uL — ABNORMAL LOW (ref 150–440)
Promyelocytes Absolute: 0 %
RBC: 5.1 MIL/uL (ref 4.40–5.90)
RDW: 13.6 % (ref 11.5–14.5)
WBC: 41.3 10*3/uL — ABNORMAL HIGH (ref 3.8–10.6)
nRBC: 0 /100 WBC

## 2017-07-02 LAB — COMPREHENSIVE METABOLIC PANEL
ALK PHOS: 74 U/L (ref 38–126)
ALT: 69 U/L — AB (ref 17–63)
ANION GAP: 14 (ref 5–15)
AST: 225 U/L — ABNORMAL HIGH (ref 15–41)
Albumin: 5.3 g/dL — ABNORMAL HIGH (ref 3.5–5.0)
BILIRUBIN TOTAL: 1.1 mg/dL (ref 0.3–1.2)
BUN: 20 mg/dL (ref 6–20)
CALCIUM: 9.8 mg/dL (ref 8.9–10.3)
CO2: 26 mmol/L (ref 22–32)
CREATININE: 2.28 mg/dL — AB (ref 0.61–1.24)
Chloride: 98 mmol/L — ABNORMAL LOW (ref 101–111)
GFR, EST AFRICAN AMERICAN: 40 mL/min — AB (ref >60)
GFR, EST NON AFRICAN AMERICAN: 35 mL/min — AB (ref >60)
Glucose, Bld: 120 mg/dL — ABNORMAL HIGH (ref 65–99)
Potassium: 3.4 mmol/L — ABNORMAL LOW (ref 3.5–5.1)
Sodium: 138 mmol/L (ref 135–145)
TOTAL PROTEIN: 8.8 g/dL — AB (ref 6.5–8.1)

## 2017-07-02 LAB — CBC
HCT: 36.1 % — ABNORMAL LOW (ref 40.0–52.0)
Hemoglobin: 12.5 g/dL — ABNORMAL LOW (ref 13.0–18.0)
MCH: 28.8 pg (ref 26.0–34.0)
MCHC: 34.7 g/dL (ref 32.0–36.0)
MCV: 82.8 fL (ref 80.0–100.0)
Platelets: 70 10*3/uL — ABNORMAL LOW (ref 150–440)
RBC: 4.36 MIL/uL — ABNORMAL LOW (ref 4.40–5.90)
RDW: 13.5 % (ref 11.5–14.5)
WBC: 34.1 10*3/uL — ABNORMAL HIGH (ref 3.8–10.6)

## 2017-07-02 LAB — BASIC METABOLIC PANEL
Anion gap: 14 (ref 5–15)
BUN: 19 mg/dL (ref 6–20)
CALCIUM: 9 mg/dL (ref 8.9–10.3)
CO2: 25 mmol/L (ref 22–32)
CREATININE: 2.43 mg/dL — AB (ref 0.61–1.24)
Chloride: 98 mmol/L — ABNORMAL LOW (ref 101–111)
GFR, EST AFRICAN AMERICAN: 37 mL/min — AB (ref >60)
GFR, EST NON AFRICAN AMERICAN: 32 mL/min — AB (ref >60)
GLUCOSE: 106 mg/dL — AB (ref 65–99)
Potassium: 3.5 mmol/L (ref 3.5–5.1)
Sodium: 137 mmol/L (ref 135–145)

## 2017-07-02 LAB — PROTIME-INR
INR: 1.16
Prothrombin Time: 14.7 seconds (ref 11.4–15.2)

## 2017-07-02 LAB — APTT: APTT: 29 s (ref 24–36)

## 2017-07-02 LAB — TYPE AND SCREEN
ABO/RH(D): O POS
Antibody Screen: NEGATIVE

## 2017-07-02 LAB — LIPASE, BLOOD: Lipase: 24 U/L (ref 11–51)

## 2017-07-02 LAB — LACTATE DEHYDROGENASE: LDH: 7082 U/L — AB (ref 98–192)

## 2017-07-02 LAB — URIC ACID: URIC ACID, SERUM: 15.4 mg/dL — AB (ref 4.4–7.6)

## 2017-07-02 LAB — TSH: TSH: 5.395 u[IU]/mL — ABNORMAL HIGH (ref 0.350–4.500)

## 2017-07-02 MED ORDER — LORAZEPAM 2 MG/ML IJ SOLN
0.5000 mg | Freq: Once | INTRAMUSCULAR | Status: AC
  Administered 2017-07-02: 0.5 mg via INTRAVENOUS

## 2017-07-02 MED ORDER — HYDROCORTISONE 2.5 % EX CREA
1.0000 "application " | TOPICAL_CREAM | Freq: Three times a day (TID) | CUTANEOUS | Status: DC | PRN
  Filled 2017-07-02: qty 30

## 2017-07-02 MED ORDER — IOPAMIDOL (ISOVUE-300) INJECTION 61%
100.0000 mL | Freq: Once | INTRAVENOUS | Status: AC | PRN
  Administered 2017-07-02: 100 mL via INTRAVENOUS

## 2017-07-02 MED ORDER — ALLOPURINOL 100 MG PO TABS
100.0000 mg | ORAL_TABLET | Freq: Every day | ORAL | Status: DC
  Filled 2017-07-02 (×2): qty 1

## 2017-07-02 MED ORDER — CITALOPRAM HYDROBROMIDE 20 MG PO TABS
40.0000 mg | ORAL_TABLET | Freq: Every day | ORAL | Status: DC
  Administered 2017-07-03 – 2017-07-05 (×3): 40 mg via ORAL
  Filled 2017-07-02 (×3): qty 2

## 2017-07-02 MED ORDER — MORPHINE SULFATE (PF) 4 MG/ML IV SOLN
4.0000 mg | Freq: Once | INTRAVENOUS | Status: AC
  Administered 2017-07-02: 4 mg via INTRAVENOUS
  Filled 2017-07-02: qty 1

## 2017-07-02 MED ORDER — ACETAMINOPHEN 500 MG PO TABS
1000.0000 mg | ORAL_TABLET | Freq: Once | ORAL | Status: AC
  Administered 2017-07-02: 1000 mg via ORAL
  Filled 2017-07-02: qty 2

## 2017-07-02 MED ORDER — OXYCODONE HCL 5 MG PO TABS
5.0000 mg | ORAL_TABLET | Freq: Once | ORAL | Status: AC
  Administered 2017-07-02: 5 mg via ORAL
  Filled 2017-07-02: qty 1

## 2017-07-02 MED ORDER — ONDANSETRON HCL 4 MG/2ML IJ SOLN
4.0000 mg | Freq: Once | INTRAMUSCULAR | Status: AC
  Administered 2017-07-02: 4 mg via INTRAVENOUS
  Filled 2017-07-02: qty 2

## 2017-07-02 MED ORDER — ACETAMINOPHEN 325 MG PO TABS
650.0000 mg | ORAL_TABLET | Freq: Four times a day (QID) | ORAL | Status: DC | PRN
  Filled 2017-07-02: qty 2

## 2017-07-02 MED ORDER — MORPHINE SULFATE (PF) 2 MG/ML IV SOLN
2.0000 mg | INTRAVENOUS | Status: DC | PRN
Start: 2017-07-02 — End: 2017-07-05
  Administered 2017-07-02 – 2017-07-05 (×12): 4 mg via INTRAVENOUS
  Filled 2017-07-02 (×11): qty 2

## 2017-07-02 MED ORDER — LORAZEPAM 2 MG/ML IJ SOLN
0.2500 mg | Freq: Three times a day (TID) | INTRAMUSCULAR | Status: DC | PRN
  Administered 2017-07-02 – 2017-07-03 (×3): 0.25 mg via INTRAVENOUS
  Filled 2017-07-02 (×3): qty 1

## 2017-07-02 MED ORDER — MORPHINE SULFATE (PF) 4 MG/ML IV SOLN
INTRAVENOUS | Status: AC
  Filled 2017-07-02: qty 1

## 2017-07-02 MED ORDER — SODIUM CHLORIDE 0.9 % IV SOLN
6.0000 mg | Freq: Once | INTRAVENOUS | Status: AC
  Administered 2017-07-03: 01:00:00 6 mg via INTRAVENOUS
  Filled 2017-07-02: qty 4

## 2017-07-02 MED ORDER — SODIUM CHLORIDE 0.9 % IV BOLUS (SEPSIS)
1000.0000 mL | Freq: Once | INTRAVENOUS | Status: AC
  Administered 2017-07-02: 1000 mL via INTRAVENOUS

## 2017-07-02 MED ORDER — LOSARTAN POTASSIUM 50 MG PO TABS: 100.0000 mg | ORAL_TABLET | Freq: Every day | ORAL | Status: DC

## 2017-07-02 MED ORDER — COLCHICINE 0.6 MG PO TABS
0.6000 mg | ORAL_TABLET | Freq: Two times a day (BID) | ORAL | Status: DC | PRN
  Administered 2017-07-02: 0.6 mg via ORAL
  Filled 2017-07-02 (×2): qty 1

## 2017-07-02 MED ORDER — DULOXETINE HCL 30 MG PO CPEP
30.0000 mg | ORAL_CAPSULE | Freq: Every day | ORAL | Status: DC
  Administered 2017-07-03 – 2017-07-05 (×3): 30 mg via ORAL
  Filled 2017-07-02 (×3): qty 1

## 2017-07-02 MED ORDER — POTASSIUM CHLORIDE CRYS ER 20 MEQ PO TBCR
40.0000 meq | EXTENDED_RELEASE_TABLET | ORAL | Status: AC
  Administered 2017-07-02: 40 meq via ORAL
  Filled 2017-07-02: qty 2

## 2017-07-02 MED ORDER — LORAZEPAM 2 MG/ML IJ SOLN
INTRAMUSCULAR | Status: AC
  Administered 2017-07-02: 0.5 mg via INTRAVENOUS
  Filled 2017-07-02: qty 1

## 2017-07-02 MED ORDER — DOCUSATE SODIUM 100 MG PO CAPS
100.0000 mg | ORAL_CAPSULE | Freq: Two times a day (BID) | ORAL | Status: DC
  Administered 2017-07-03 – 2017-07-04 (×3): 100 mg via ORAL
  Filled 2017-07-02 (×4): qty 1

## 2017-07-02 MED ORDER — ONDANSETRON HCL 4 MG/2ML IJ SOLN: 4.0000 mg | Freq: Four times a day (QID) | INTRAMUSCULAR | Status: DC | PRN

## 2017-07-02 MED ORDER — ACETAMINOPHEN 650 MG RE SUPP: 650.0000 mg | Freq: Four times a day (QID) | RECTAL | Status: DC | PRN

## 2017-07-02 MED ORDER — SODIUM CHLORIDE 0.9 % IV SOLN
INTRAVENOUS | Status: DC
  Administered 2017-07-02 – 2017-07-05 (×8): via INTRAVENOUS

## 2017-07-02 MED ORDER — ONDANSETRON HCL 4 MG PO TABS: 4.0000 mg | ORAL_TABLET | Freq: Four times a day (QID) | ORAL | Status: DC | PRN

## 2017-07-02 MED ORDER — HYDROCODONE-ACETAMINOPHEN 5-325 MG PO TABS
1.0000 | ORAL_TABLET | Freq: Two times a day (BID) | ORAL | Status: DC | PRN
  Administered 2017-07-02 – 2017-07-03 (×2): 1 via ORAL
  Filled 2017-07-02 (×2): qty 1

## 2017-07-02 NOTE — ED Notes (Signed)
Shannon RN, aware of bed assigned  

## 2017-07-02 NOTE — ED Notes (Signed)
PT reports pain is in LUQ of abd and into left ribs and wrapping around to lower abd and right lower abd.. Pt describes pain as feeling "full"

## 2017-07-02 NOTE — ED Provider Notes (Signed)
[facility] Emergency Department Provider Note  ____________________________________________  Time seen: Approximately 3:38 PM  I have reviewed the triage vital signs and the nursing notes.   HISTORY  Chief Complaint Abdominal Pain   HPI Chaka Jefferys is a 36 y.o. male no significant past medical history who presents for evaluation of left-sided abdominal pain.Two days ago patient was laying on the floor watching football on TV when his 80-lb daughter jumped on his abdomen. Since then patient has had constant severe sharp pain located ion the LUQ radiating to the rest of his abdomen. Yesterday had one episode of vomiting and today had 2 loose stools. No fever or chills. No prior abdominal surgeries. No dysuria or hematuria. Went to UC today and was sent here for tachycardia and tenderness to palpation.   Past Medical History:  Diagnosis Date  . ADHD, predominantly inattentive type   . Elevated LFTs 11/19/2013  . Erectile dysfunction   . Generalized anxiety disorder   . Gout   . Hypertension   . Recurrent cold sores   . Seborrheic dermatitis     Patient Active Problem List   Diagnosis Date Noted  . Splenic laceration, initial encounter   . AKI (acute kidney injury) ([place]) 07/02/2017  . Cellulitis of foot 05/15/2017  . Gout   . Alcohol abuse, daily use 05/05/2017  . Cellulitis of right leg 05/05/2017  . Effusion of right knee joint 05/05/2017  . Preventative health care 03/19/2016  . Chronic back pain 03/19/2016  . Recurrent cold sores   . Seborrheic dermatitis   . ADHD, predominantly inattentive type   . Generalized anxiety disorder   . Elevated LFTs 11/19/2013  . Erectile dysfunction   . Hypertension     Past Surgical History:  Procedure Laterality Date  . VASECTOMY  2014    Prior to Admission medications   Medication Sig Start Date End Date Taking? Authorizing Provider  citalopram (CELEXA) 40 MG tablet TAKE 1 TABLET (40 MG TOTAL) BY  MOUTH DAILY. 11/06/16  Yes Viviana Simpler I, MD  colchicine 0.6 MG tablet Take 1 tablet (0.6 mg total) by mouth 2 (two) times daily as needed. For gout flares Patient taking differently: Take 0.6 mg by mouth daily. For gout flares 05/15/17  Yes Venia Carbon, MD  DULoxetine (CYMBALTA) 30 MG capsule Take 1 capsule (30 mg total) by mouth daily. 02/19/17  Yes Venia Carbon, MD  HYDROcodone-acetaminophen (NORCO/VICODIN) 5-325 MG tablet Take 1 tablet by mouth 2 (two) times daily as needed for moderate pain. 06/13/17  Yes Viviana Simpler I, MD  hydrocortisone 2.5 % cream APPLY TOPICALLY 3 (THREE) TIMES DAILY AS NEEDED. Patient taking differently: Apply 1 application three times a day to affected site as needed for itching 04/18/16  Yes Viviana Simpler I, MD  indomethacin (INDOCIN) 50 MG capsule TAKE 1 CAPSULE BY MOUTH TWICE A DAY WITH A MEAL 06/03/17  Yes Venia Carbon, MD  losartan (COZAAR) 100 MG tablet Take 1 tablet (100 mg total) by mouth daily. 05/09/17  Yes Debbe Odea, MD  acetaminophen (TYLENOL) 325 MG tablet Take 2 tablets (650 mg total) by mouth every 6 (six) hours as needed for mild pain (or Fever >/= 101). Patient not taking: Reported on 07/02/2017 05/08/17   Debbe Odea, MD    Allergies Sulfa antibiotics  History reviewed. No pertinent family history.  Social History Social History  Substance Use Topics  . Smoking status: Never Smoker  . Smokeless tobacco: Never Used  .  Alcohol use Yes     Comment: daily use     Review of Systems  Constitutional: Negative for fever. Eyes: Negative for visual changes. ENT: Negative for sore throat. Neck: No neck pain  Cardiovascular: Negative for chest pain. Respiratory: Negative for shortness of breath. Gastrointestinal: + LUQ abdominal pain, vomiting and diarrhea. Genitourinary: Negative for dysuria. Musculoskeletal: Negative for back pain. Skin: Negative for rash. Neurological: Negative for headaches, weakness or numbness. Psych:  No SI or HI  ____________________________________________   PHYSICAL EXAM:  VITAL SIGNS: ED Triage Vitals  Enc Vitals Group     BP 07/02/17 1519 120/74     Pulse Rate 07/02/17 1519 (!) 114     Resp 07/02/17 1519 20     Temp 07/02/17 1519 98.5 F (36.9 C)     Temp Source 07/02/17 1519 Oral     SpO2 07/02/17 1519 97 %     Weight 07/02/17 1519 250 lb (113.4 kg)     Height 07/02/17 1519 _0  (1.88 m)     Head Circumference --      Peak Flow --      Pain Score 07/02/17 1518 10     Pain Loc --      Pain Edu? --      Excl. in [place]? --     Constitutional: Alert and oriented, mild distress, not pale or diaphoretic HEENT:      Head: Normocephalic and atraumatic.         Eyes: Conjunctivae are normal. Sclera is non-icteric.       Mouth/Throat: Mucous membranes are moist.       Neck: Supple with no signs of meningismus. Cardiovascular: Tachycardic with regular rhythm. No murmurs, gallops, or rubs. 2+ symmetrical distal pulses are present in all extremities. No JVD. Respiratory: ttp over the Left lower lateral rib cage. Normal respiratory effort. Lungs are clear to auscultation bilaterally. No wheezes, crackles, or rhonchi.  Gastrointestinal: Soft, diffuse abdominal ttp worse in the LUQ, and non distended with positive bowel sounds. No rebound or guarding. Genitourinary: No CVA tenderness. Musculoskeletal: Nontender with normal range of motion in all extremities. No edema, cyanosis, or erythema of extremities. Neurologic: Normal speech and language. Face is symmetric. Moving all extremities. No gross focal neurologic deficits are appreciated. Skin: Skin is warm, dry and intact. No rash noted. Psychiatric: Mood and affect are normal. Speech and behavior are normal.  ____________________________________________   LABS (all labs ordered are listed, but only abnormal results are displayed)  Labs Reviewed  CBC WITH DIFFERENTIAL/PLATELET - Abnormal; Notable for the following:        Result Value   WBC 41.3 (*)    Platelets 89 (*)    Neutro Abs 21.9 (*)    Lymphs Abs 14.5 (*)    Monocytes Absolute 3.3 (*)    All other components within normal limits  COMPREHENSIVE METABOLIC PANEL - Abnormal; Notable for the following:    Potassium 3.4 (*)    Chloride 98 (*)    Glucose, Bld 120 (*)    Creatinine, Ser 2.28 (*)    Total Protein 8.8 (*)    Albumin 5.3 (*)    AST 225 (*)    ALT 69 (*)    GFR calc non Af Amer 35 (*)    GFR calc Af Amer 40 (*)    All other components within normal limits  LACTATE DEHYDROGENASE - Abnormal; Notable for the following:    LDH 7,082 (*)    All other  components within normal limits  URIC ACID - Abnormal; Notable for the following:    Uric Acid, Serum 15.4 (*)    All other components within normal limits  BASIC METABOLIC PANEL - Abnormal; Notable for the following:    Chloride 98 (*)    Glucose, Bld 106 (*)    Creatinine, Ser 2.43 (*)    GFR calc non Af Amer 32 (*)    GFR calc Af Amer 37 (*)    All other components within normal limits  RASBURICASE - URIC ACID - Abnormal; Notable for the following:    Uric Acid, Serum 11.9 (*)    All other components within normal limits  CBC - Abnormal; Notable for the following:    WBC 34.1 (*)    RBC 4.36 (*)    Hemoglobin 12.5 (*)    HCT 36.1 (*)    Platelets 70 (*)    All other components within normal limits  COMPREHENSIVE METABOLIC PANEL - Abnormal; Notable for the following:    Potassium 3.4 (*)    BUN 22 (*)    Creatinine, Ser 2.40 (*)    Calcium 7.9 (*)    Total Protein 6.4 (*)    AST 160 (*)    GFR calc non Af Amer 33 (*)    GFR calc Af Amer 38 (*)    All other components within normal limits  TSH - Abnormal; Notable for the following:    TSH 5.395 (*)    All other components within normal limits  PROTIME-INR  APTT  LIPASE, BLOOD  PATHOLOGIST SMEAR REVIEW  PHOSPHORUS  HEMOGLOBIN A1C  T4, FREE  URINALYSIS, COMPLETE (UACMP) WITH MICROSCOPIC  COMP PANEL: LEUKEMIA/LYMPHOMA    HIV ANTIBODY (ROUTINE TESTING)  HEPATITIS B SURFACE ANTIBODY  HEPATITIS B SURFACE ANTIGEN  HEPATITIS B CORE ANTIBODY, TOTAL  HEPATITIS C ANTIBODY  T3, FREE  TYPE AND SCREEN  SURGICAL PATHOLOGY   ____________________________________________  EKG  none  ____________________________________________  RADIOLOGY  CT a/p: 1. The bandlike low-attenuation medial spleen in combination with the high attenuation fluid in the pelvis is most consistent with a subtle splenic laceration resulting in hemoperitoneum. I suspect the laceration and bleeding are subacute. There is no active hemorrhage identified on this study. 2. Splenomegaly and lymphadenopathy. While not specific, lymphoma is the most likely explanation. 3. No excretion from the kidneys on delayed imaging. Recommend correlation with renal function. Findings called to the patient's ER physician. ____________________________________________   PROCEDURES  Procedure(s) performed: None Procedures Critical Care performed:  None ____________________________________________   INITIAL IMPRESSION / ASSESSMENT AND PLAN / ED COURSE  36 y.o. male no significant past medical history who presents for evaluation of left-sided abdominal pain for two days that started when his 80-lb daughter jumped on his abdomen. patient is in mild distress due to pain, not pale or diaphoretic, afebrile, normotensive, patient is slightly tachycardic with a pulse of 114. His abdomen has diffuse tenderness to palpation worse on the left upper quadrant and also tenderness to palpation on the left lateral lower chest wall region. Differential diagnoses including splenic rupture versus rib fracture versus hematoma. CT pending. Labs pending. Will give IVF, morphine and zofran  Clinical Course as of Jul 04 1411  Tue Jul 02, 2017  1717 patient's CT scan concerning for a possible subacute very small splenic laceration with some old small amount of blood in the  pelvis. I discussed these findings with Dr. Rosana Hoes, surgeon on call who reviewed the imaging study and agrees  that there is no active bleeding and since the injury happened 2 days ago, patient is hemodynamically stable, and his hemoglobin is normal he does not believe patient needs admission to the hospital for this problem. The CT is also concerning for splenomegaly and several enlarged lymph nodes  and patient was also found to have a white count of 41,000. These findings are concerning for possible lymphoma or leukemia. I discussed with Dr. Janese Banks, oncologist on call who requested a flow cytometry, uric acid, and LDH and plan to do bone marrow biopsy in the morning. Patient has slightly elevated LFTs and history of alcohol abuse. Labs also consistent with thrombocytopenia which can be seen in the setting of the liver disease but also in the setting of a blood malignancies. Patient has acute kidney injury with a creatinine of 2.28 for which she is receiving IV fluids. I have discussed all of these findings with patient and his wife on the phone. Patient will be admitted to Hospitalist service.  [CV]    Clinical Course User Index [CV] Rudene Re, MD    Pertinent labs & imaging results that were available during my care of the patient were reviewed by me and considered in my medical decision making (see chart for details).    ____________________________________________   FINAL CLINICAL IMPRESSION(S) / ED DIAGNOSES  Final diagnoses:  AKI (acute kidney injury) ([place])  Thrombocytopenia ([facility])  Leukocytosis, unspecified type  Splenomegaly  Abdominal lymphadenopathy  Transaminitis  Splenic laceration, initial encounter      NEW MEDICATIONS STARTED DURING THIS VISIT:  Current Discharge Medication List       Note:  This document was prepared using Dragon voice recognition software and may include unintentional dictation errors.    Alfred Levins, [place], MD 07/03/17 [phone]

## 2017-07-02 NOTE — ED Triage Notes (Addendum)
Pt c/o left upper abdomen pain under ribs. Pt denies pain to ribs, describes as deeper.  Pt appears pale and is cool/diaphretic.  VSS.  daughter jumped and landed on left side Sunday and pain has been getting slowly worse.  Has had nausea and vomiting.  She landed to flank area on left but pain mostly deep LUQ.  Denies hematuria.
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
## 2017-07-03 ENCOUNTER — Inpatient Hospital Stay: Payer: BC Managed Care – PPO

## 2017-07-03 ENCOUNTER — Other Ambulatory Visit (HOSPITAL_COMMUNITY)
Admission: RE | Admit: 2017-07-03 | Discharge: 2017-07-03 | Disposition: A | Discharge status: Home / Self Care | Payer: BC Managed Care – PPO | Source: Ambulatory Visit | Attending: Oncology | Admitting: Oncology

## 2017-07-03 ENCOUNTER — Inpatient Hospital Stay (HOSPITAL_COMMUNITY)
Admit: 2017-07-03 | Discharge: 2017-07-03 | Disposition: A | Discharge status: Home / Self Care | Payer: BC Managed Care – PPO | Attending: Internal Medicine | Admitting: Internal Medicine

## 2017-07-03 DIAGNOSIS — R591 Generalized enlarged lymph nodes: Secondary | ICD-10-CM

## 2017-07-03 DIAGNOSIS — R7989 Other specified abnormal findings of blood chemistry: Secondary | ICD-10-CM

## 2017-07-03 DIAGNOSIS — Z79899 Other long term (current) drug therapy: Secondary | ICD-10-CM

## 2017-07-03 DIAGNOSIS — E790 Hyperuricemia without signs of inflammatory arthritis and tophaceous disease: Secondary | ICD-10-CM

## 2017-07-03 DIAGNOSIS — R1012 Left upper quadrant pain: Secondary | ICD-10-CM

## 2017-07-03 DIAGNOSIS — R5383 Other fatigue: Secondary | ICD-10-CM

## 2017-07-03 DIAGNOSIS — M549 Dorsalgia, unspecified: Secondary | ICD-10-CM

## 2017-07-03 DIAGNOSIS — R531 Weakness: Secondary | ICD-10-CM

## 2017-07-03 DIAGNOSIS — S36039A Unspecified laceration of spleen, initial encounter: Secondary | ICD-10-CM

## 2017-07-03 DIAGNOSIS — I10 Essential (primary) hypertension: Secondary | ICD-10-CM

## 2017-07-03 DIAGNOSIS — F329 Major depressive disorder, single episode, unspecified: Secondary | ICD-10-CM

## 2017-07-03 DIAGNOSIS — R161 Splenomegaly, not elsewhere classified: Secondary | ICD-10-CM

## 2017-07-03 DIAGNOSIS — R51 Headache: Secondary | ICD-10-CM

## 2017-07-03 DIAGNOSIS — F909 Attention-deficit hyperactivity disorder, unspecified type: Secondary | ICD-10-CM

## 2017-07-03 DIAGNOSIS — D696 Thrombocytopenia, unspecified: Secondary | ICD-10-CM

## 2017-07-03 DIAGNOSIS — F419 Anxiety disorder, unspecified: Secondary | ICD-10-CM

## 2017-07-03 DIAGNOSIS — R634 Abnormal weight loss: Secondary | ICD-10-CM

## 2017-07-03 DIAGNOSIS — G8929 Other chronic pain: Secondary | ICD-10-CM

## 2017-07-03 DIAGNOSIS — F1010 Alcohol abuse, uncomplicated: Secondary | ICD-10-CM

## 2017-07-03 DIAGNOSIS — Z8619 Personal history of other infectious and parasitic diseases: Secondary | ICD-10-CM

## 2017-07-03 DIAGNOSIS — R948 Abnormal results of function studies of other organs and systems: Secondary | ICD-10-CM

## 2017-07-03 DIAGNOSIS — N529 Male erectile dysfunction, unspecified: Secondary | ICD-10-CM

## 2017-07-03 DIAGNOSIS — R0600 Dyspnea, unspecified: Secondary | ICD-10-CM

## 2017-07-03 DIAGNOSIS — N179 Acute kidney failure, unspecified: Secondary | ICD-10-CM

## 2017-07-03 DIAGNOSIS — R740 Nonspecific elevation of levels of transaminase and lactic acid dehydrogenase [LDH]: Secondary | ICD-10-CM

## 2017-07-03 LAB — PATHOLOGIST SMEAR REVIEW

## 2017-07-03 LAB — COMPREHENSIVE METABOLIC PANEL
ALT: 47 U/L (ref 17–63)
AST: 160 U/L — ABNORMAL HIGH (ref 15–41)
Albumin: 3.7 g/dL (ref 3.5–5.0)
Alkaline Phosphatase: 52 U/L (ref 38–126)
Anion gap: 10 (ref 5–15)
BUN: 22 mg/dL — ABNORMAL HIGH (ref 6–20)
CO2: 28 mmol/L (ref 22–32)
Calcium: 7.9 mg/dL — ABNORMAL LOW (ref 8.9–10.3)
Chloride: 103 mmol/L (ref 101–111)
Creatinine, Ser: 2.4 mg/dL — ABNORMAL HIGH (ref 0.61–1.24)
GFR calc Af Amer: 38 mL/min — ABNORMAL LOW (ref >60)
GFR calc non Af Amer: 33 mL/min — ABNORMAL LOW (ref >60)
Glucose, Bld: 95 mg/dL (ref 65–99)
Potassium: 3.4 mmol/L — ABNORMAL LOW (ref 3.5–5.1)
Sodium: 141 mmol/L (ref 135–145)
Total Bilirubin: 0.9 mg/dL (ref 0.3–1.2)
Total Protein: 6.4 g/dL — ABNORMAL LOW (ref 6.5–8.1)

## 2017-07-03 LAB — HEMOGLOBIN A1C
Hgb A1c MFr Bld: 5.2 % (ref 4.8–5.6)
Mean Plasma Glucose: 102.54 mg/dL

## 2017-07-03 LAB — PHOSPHORUS: Phosphorus: 4.6 mg/dL (ref 2.5–4.6)

## 2017-07-03 LAB — T4, FREE: FREE T4: 1.04 ng/dL (ref 0.61–1.12)

## 2017-07-03 LAB — RASBURICASE - URIC ACID: Uric Acid, Serum: 11.9 mg/dL — ABNORMAL HIGH (ref 4.4–7.6)

## 2017-07-03 MED ORDER — FENTANYL CITRATE (PF) 100 MCG/2ML IJ SOLN
INTRAMUSCULAR | Status: AC | PRN
  Administered 2017-07-03: 50 ug via INTRAVENOUS
  Administered 2017-07-03: 25 ug via INTRAVENOUS
  Administered 2017-07-03: 50 ug via INTRAVENOUS
  Administered 2017-07-03 (×2): 25 ug via INTRAVENOUS

## 2017-07-03 MED ORDER — PSEUDOEPHEDRINE HCL 30 MG PO TABS
30.0000 mg | ORAL_TABLET | ORAL | Status: DC | PRN
  Administered 2017-07-03 – 2017-07-04 (×2): 30 mg via ORAL
  Filled 2017-07-03 (×3): qty 1

## 2017-07-03 MED ORDER — HYDROCODONE-ACETAMINOPHEN 5-325 MG PO TABS
1.0000 | ORAL_TABLET | ORAL | Status: DC | PRN
  Administered 2017-07-03 – 2017-07-05 (×5): 1 via ORAL
  Filled 2017-07-03 (×5): qty 1

## 2017-07-03 MED ORDER — LORAZEPAM 2 MG/ML PO CONC: 1.0000 mg | Freq: Four times a day (QID) | ORAL | Status: DC | PRN

## 2017-07-03 MED ORDER — ALLOPURINOL 100 MG PO TABS
100.0000 mg | ORAL_TABLET | Freq: Three times a day (TID) | ORAL | Status: DC
  Administered 2017-07-03 – 2017-07-05 (×6): 100 mg via ORAL
  Filled 2017-07-03 (×8): qty 1

## 2017-07-03 MED ORDER — HEPARIN SOD (PORK) LOCK FLUSH 100 UNIT/ML IV SOLN
INTRAVENOUS | Status: AC
  Administered 2017-07-03: 10:00:00
  Filled 2017-07-03: qty 5

## 2017-07-03 MED ORDER — MIDAZOLAM HCL 5 MG/5ML IJ SOLN
INTRAMUSCULAR | Status: AC
  Administered 2017-07-03: 10:00:00
  Filled 2017-07-03: qty 5

## 2017-07-03 MED ORDER — MIDAZOLAM HCL 5 MG/5ML IJ SOLN
INTRAMUSCULAR | Status: AC | PRN
  Administered 2017-07-03 (×3): 1 mg via INTRAVENOUS

## 2017-07-03 MED ORDER — GADOBENATE DIMEGLUMINE 529 MG/ML IV SOLN
10.0000 mL | Freq: Once | INTRAVENOUS | Status: AC | PRN
  Administered 2017-07-03: 21:00:00 10 mL via INTRAVENOUS
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
## 2017-07-03 NOTE — Progress Notes (Signed)
PET Scan scheduled 07/04/17 0800am

## 2017-07-03 NOTE — Progress Notes (Signed)
Pt has been NPO. Juanita RN called back from [facility]- now able to do Bone Marrow biopsy this morning. Dr. Manuella Ghazi updated.

## 2017-07-03 NOTE — Progress Notes (Signed)
Spoke with Juanita RN in [place]- unable to do bone marrow biopsy today due to scheduling- will plan to do first thing 07/04/17 morning. Dr. Manuella Ghazi updated.
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
## 2017-07-03 NOTE — Progress Notes (Signed)
Pt c/o pressure behind eyes, requesting Sudafed. See new orders.
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
## 2017-07-03 NOTE — Procedures (Signed)
Pre-procedure Diagnosis: Concern for lymphoma Post-procedure Diagnosis: Same  Technically successful CT guided bone marrow aspiration and biopsy of left iliac crest.   Complications: None Immediate  EBL: None  SignedSandi Mariscal Pager: [phone] 07/03/2017, 11:17 AM
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
## 2017-07-04 ENCOUNTER — Encounter: Payer: Self-pay | Admitting: Radiology

## 2017-07-04 ENCOUNTER — Inpatient Hospital Stay: Payer: BC Managed Care – PPO

## 2017-07-04 DIAGNOSIS — D72829 Elevated white blood cell count, unspecified: Secondary | ICD-10-CM

## 2017-07-04 DIAGNOSIS — M109 Gout, unspecified: Secondary | ICD-10-CM

## 2017-07-04 DIAGNOSIS — C8510 Unspecified B-cell lymphoma, unspecified site: Secondary | ICD-10-CM

## 2017-07-04 DIAGNOSIS — R590 Localized enlarged lymph nodes: Secondary | ICD-10-CM

## 2017-07-04 LAB — CBC
HCT: 34.2 % — ABNORMAL LOW (ref 40.0–52.0)
Hemoglobin: 12.2 g/dL — ABNORMAL LOW (ref 13.0–18.0)
MCH: 30.1 pg (ref 26.0–34.0)
MCHC: 35.7 g/dL (ref 32.0–36.0)
MCV: 84.3 fL (ref 80.0–100.0)
PLATELETS: 55 10*3/uL — AB (ref 150–440)
RBC: 4.05 MIL/uL — AB (ref 4.40–5.90)
RDW: 14.1 % (ref 11.5–14.5)
WBC: 30.5 10*3/uL — AB (ref 3.8–10.6)

## 2017-07-04 LAB — COMPREHENSIVE METABOLIC PANEL
ALT: 51 U/L (ref 17–63)
AST: 214 U/L — ABNORMAL HIGH (ref 15–41)
Albumin: 3.5 g/dL (ref 3.5–5.0)
Alkaline Phosphatase: 66 U/L (ref 38–126)
Anion gap: 9 (ref 5–15)
BUN: 16 mg/dL (ref 6–20)
CO2: 28 mmol/L (ref 22–32)
Calcium: 8.3 mg/dL — ABNORMAL LOW (ref 8.9–10.3)
Chloride: 104 mmol/L (ref 101–111)
Creatinine, Ser: 1.11 mg/dL (ref 0.61–1.24)
GFR calc Af Amer: >60 mL/min (ref >60)
GFR calc non Af Amer: >60 mL/min (ref >60)
Glucose, Bld: 90 mg/dL (ref 65–99)
Potassium: 3.6 mmol/L (ref 3.5–5.1)
Sodium: 141 mmol/L (ref 135–145)
Total Bilirubin: 1.3 mg/dL — ABNORMAL HIGH (ref 0.3–1.2)
Total Protein: 6.2 g/dL — ABNORMAL LOW (ref 6.5–8.1)

## 2017-07-04 LAB — URINALYSIS, COMPLETE (UACMP) WITH MICROSCOPIC
Bacteria, UA: NONE SEEN
Bilirubin Urine: NEGATIVE
Glucose, UA: NEGATIVE mg/dL
Ketones, ur: NEGATIVE mg/dL
Leukocytes, UA: NEGATIVE
Nitrite: NEGATIVE
Protein, ur: NEGATIVE mg/dL
Specific Gravity, Urine: 1.01 (ref 1.005–1.030)
pH: 5 (ref 5.0–8.0)

## 2017-07-04 LAB — HEPATITIS B CORE ANTIBODY, TOTAL: Hep B Core Total Ab: NEGATIVE

## 2017-07-04 LAB — T3, FREE: T3, Free: 3.7 pg/mL (ref 2.0–4.4)

## 2017-07-04 LAB — COMP PANEL: LEUKEMIA/LYMPHOMA

## 2017-07-04 LAB — HEPATITIS B SURFACE ANTIGEN: HEP B S AG: NEGATIVE

## 2017-07-04 LAB — ECHOCARDIOGRAM COMPLETE
Height: 74 in
WEIGHTICAEL: 4107.2 [oz_av]

## 2017-07-04 LAB — RASBURICASE - URIC ACID: URIC ACID, SERUM: 4 mg/dL — AB (ref 4.4–7.6)

## 2017-07-04 LAB — HIV ANTIBODY (ROUTINE TESTING W REFLEX): HIV Screen 4th Generation wRfx: NONREACTIVE

## 2017-07-04 LAB — HEPATITIS C ANTIBODY: HCV Ab: <0.1 s/co ratio (ref 0.0–0.9)

## 2017-07-04 LAB — LACTATE DEHYDROGENASE: LDH: 7835 U/L — ABNORMAL HIGH (ref 98–192)

## 2017-07-04 LAB — PHOSPHORUS: Phosphorus: 3 mg/dL (ref 2.5–4.6)

## 2017-07-04 LAB — GLUCOSE, CAPILLARY: Glucose-Capillary: 79 mg/dL (ref 65–99)

## 2017-07-04 LAB — HEPATITIS B SURFACE ANTIBODY,QUALITATIVE: HEP B S AB: NONREACTIVE

## 2017-07-04 MED ORDER — ASPIRIN-ACETAMINOPHEN-CAFFEINE 250-250-65 MG PO TABS
1.0000 | ORAL_TABLET | Freq: Four times a day (QID) | ORAL | Status: DC | PRN
  Administered 2017-07-04: 1 via ORAL
  Filled 2017-07-04 (×4): qty 1

## 2017-07-04 MED ORDER — ALPRAZOLAM 1 MG PO TABS: 1.0000 mg | ORAL_TABLET | Freq: Three times a day (TID) | ORAL | 0 refills | Status: DC | PRN

## 2017-07-04 MED ORDER — SENNOSIDES-DOCUSATE SODIUM 8.6-50 MG PO TABS
2.0000 | ORAL_TABLET | Freq: Two times a day (BID) | ORAL | Status: DC
  Administered 2017-07-04 – 2017-07-05 (×3): 2 via ORAL
  Filled 2017-07-04 (×3): qty 2

## 2017-07-04 MED ORDER — ALPRAZOLAM 1 MG PO TABS
1.0000 mg | ORAL_TABLET | Freq: Three times a day (TID) | ORAL | Status: DC | PRN
  Administered 2017-07-04 – 2017-07-05 (×2): 1 mg via ORAL
  Filled 2017-07-04 (×2): qty 1

## 2017-07-04 MED ORDER — FLUDEOXYGLUCOSE F - 18 (FDG) INJECTION
12.8000 | Freq: Once | INTRAVENOUS | Status: AC | PRN
  Administered 2017-07-04: 12.8 via INTRAVENOUS

## 2017-07-04 MED ORDER — ALLOPURINOL 100 MG PO TABS: 100.0000 mg | ORAL_TABLET | Freq: Three times a day (TID) | ORAL | Status: DC

## 2017-07-04 NOTE — Discharge Summary (Signed)
[facility] [place] NAME: Mark Burns    MR#:  [id number]  DATE OF BIRTH:  [birth year]  DATE OF ADMISSION:  07/02/2017   ADMITTING PHYSICIAN: Harrie Foreman, MD  DATE OF DISCHARGE: 07/04/2017  PRIMARY CARE PHYSICIAN: Venia Carbon, MD   ADMISSION DIAGNOSIS:  Splenomegaly [R16.1] Thrombocytopenia ([place]) [D69.6] Transaminitis [R74.0] AKI (acute kidney injury) ([place]) [N17.9] Abdominal lymphadenopathy [R59.0] Splenic laceration, initial encounter [X21.194R] Leukocytosis, unspecified type [D72.829] DISCHARGE DIAGNOSIS:  Active Problems:   AKI (acute kidney injury) ([place])   Splenic laceration, initial encounter  SECONDARY DIAGNOSIS:   Past Medical History:  Diagnosis Date  . ADHD, predominantly inattentive type   . Elevated LFTs 11/19/2013  . Erectile dysfunction   . Generalized anxiety disorder   . Gout   . Hypertension   . Recurrent cold sores   . Seborrheic dermatitis    HOSPITAL COURSE:  Patient is a 36 y.o.malepresenting with abdominal pain found to have adenopathy, splenomegaly and acute kidney injury likely from tumor lysis syndrome  * AKI: resolved with intravenous fluids. Avoid nephrotoxic agents. 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  *Shortness of breath - echo within normal limits - CT chest without  contrast history of lymphoproliferative disorder as well  DISCHARGE CONDITIONS:  fair CONSULTS OBTAINED:  Treatment Team:  Vickie Epley, MD Sindy Guadeloupe, MD DRUG ALLERGIES:   Allergies  Allergen Reactions  . Sulfa Antibiotics Hives   DISCHARGE MEDICATIONS:   Allergies as of 07/04/2017      Reactions   Sulfa Antibiotics Hives      Medication List    TAKE these medications   acetaminophen 325 MG tablet Commonly known as:  TYLENOL Take 2 tablets (650 mg total) by mouth every 6 (six) hours as needed for mild pain (or Fever >/= 101).   allopurinol 100 MG tablet Commonly known as:  ZYLOPRIM Take 1 tablet (100 mg total) by mouth 3 (three) times daily.   ALPRAZolam 1 MG tablet Commonly known as:  XANAX Take 1 tablet (1 mg total) by mouth every 8 (eight) hours as needed for anxiety.   citalopram 40 MG tablet Commonly known as:  CELEXA TAKE 1 TABLET (40 MG TOTAL) BY MOUTH DAILY.   colchicine 0.6 MG tablet Take 1 tablet (0.6 mg total) by mouth 2 (two) times daily as needed. For gout flares What changed:  when to take this  additional instructions   DULoxetine 30 MG capsule Commonly known as:  CYMBALTA Take 1 capsule (30 mg total) by mouth daily.   HYDROcodone-acetaminophen 5-325 MG tablet Commonly known as:  NORCO/VICODIN Take 1 tablet by mouth 2 (two) times daily as needed for moderate pain.   hydrocortisone 2.5 % cream APPLY TOPICALLY 3 (THREE) TIMES DAILY AS NEEDED. What changed:  See the new instructions.   indomethacin 50 MG capsule Commonly known as:  INDOCIN TAKE 1 CAPSULE BY MOUTH TWICE A DAY WITH A MEAL  losartan 100 MG tablet Commonly known as:  COZAAR Take 1 tablet (100 mg total) by mouth daily.            Discharge Care Instructions        Start     Ordered   07/04/17 0000  allopurinol (ZYLOPRIM) 100 MG tablet  3 times daily     07/04/17 1825   07/04/17 0000  ALPRAZolam (XANAX) 1 MG tablet  Every 8 hours PRN     07/04/17 1825    07/04/17 0000  Increase activity slowly     07/04/17 1825   07/04/17 0000  Diet - low sodium heart healthy     07/04/17 1825       DISCHARGE INSTRUCTIONS:   DIET:  Regular diet DISCHARGE CONDITION:  Good ACTIVITY:  Activity as tolerated OXYGEN:  Home Oxygen: No.  Oxygen Delivery: room air DISCHARGE LOCATION:  [place]   If you experience worsening of your admission symptoms, develop shortness of breath, life threatening emergency, suicidal or homicidal thoughts you must seek medical attention immediately by calling 911 or calling your MD immediately  if symptoms less severe.  You Must read complete instructions/literature along with all the possible adverse reactions/side effects for all the Medicines you take and that have been prescribed to you. Take any new Medicines after you have completely understood and accpet all the possible adverse reactions/side effects.   Please note  You were cared for by a hospitalist during your hospital stay. If you have any questions about your discharge medications or the care you received while you were in the hospital after you are discharged, you can call the unit and asked to speak with the hospitalist on call if the hospitalist that took care of you is not available. Once you are discharged, your primary care physician will handle any further medical issues. Please note that NO REFILLS for any discharge medications will be authorized once you are discharged, as it is imperative that you return to your primary care physician (or establish a relationship with a primary care physician if you do not have one) for your aftercare needs so that they can reassess your need for medications and monitor your lab values.    On the day of Discharge:  VITAL SIGNS:  Blood pressure 134/75, pulse 85, temperature 98.2 F (36.8 C), temperature source Oral, resp. rate 18, height _0  (1.88 m), weight 115.9 kg (255 lb 9.6 oz), SpO2 93 %. PHYSICAL  EXAMINATION:  GENERAL:  36 y.o.-year-old patient lying in the bed with no acute distress.  EYES: Pupils equal, round, reactive to light and accommodation. No scleral icterus. Extraocular muscles intact.  HEENT: Head atraumatic, normocephalic. Oropharynx and nasopharynx clear.  NECK:  Supple, no jugular venous distention. No thyroid enlargement, no tenderness.  LUNGS: Normal breath sounds bilaterally, no wheezing, rales,rhonchi or crepitation. No use of accessory muscles of respiration.  CARDIOVASCULAR: S1, S2 normal. No murmurs, rubs, or gallops.  ABDOMEN: Soft, non-tender, non-distended. Bowel sounds present. No organomegaly or mass.  EXTREMITIES: No pedal edema, cyanosis, or clubbing.  NEUROLOGIC: Cranial nerves II through XII are intact. Muscle strength 5/5 in all extremities. Sensation intact. Gait not checked.  PSYCHIATRIC: The patient is alert and oriented x 3.  SKIN: No obvious rash, lesion, or ulcer.  DATA REVIEW:   CBC  Recent Labs Lab 07/04/17 0538  WBC 30.5*  HGB 12.2*  HCT 34.2*  PLT 55*    Chemistries   Recent Labs Lab 07/04/17 [phone]  NA 141  K 3.6  CL 104  CO2 28  GLUCOSE 90  BUN 16  CREATININE 1.11  CALCIUM 8.3*  AST 214*  ALT 51  ALKPHOS 66  BILITOT 1.3*     Microbiology Results  Results for orders placed or performed during the hospital encounter of 05/05/17  Culture, blood (Routine X 2) w Reflex to ID Panel     Status: None   Collection Time: 05/05/17  4:00 PM  Result Value Ref Range Status   Specimen Description BLOOD RIGHT ANTECUBITAL  Final   Special Requests   Final    BOTTLES DRAWN AEROBIC AND ANAEROBIC Blood Culture adequate volume   Culture NO GROWTH 5 DAYS  Final   Report Status 05/10/2017 FINAL  Final  Culture, blood (Routine X 2) w Reflex to ID Panel     Status: None   Collection Time: 05/05/17  4:10 PM  Result Value Ref Range Status   Specimen Description BLOOD RIGHT ARM  Final   Special Requests   Final    BOTTLES DRAWN AEROBIC  ONLY Blood Culture adequate volume   Culture NO GROWTH 5 DAYS  Final   Report Status 05/10/2017 FINAL  Final  Gram stain     Status: None   Collection Time: 05/05/17  8:27 PM  Result Value Ref Range Status   Specimen Description FLUID SYNOVIAL RIGHT KNEE  Final   Special Requests Normal  Final   Gram Stain   Final    MODERATE WBC PRESENT, PREDOMINANTLY PMN NO ORGANISMS SEEN    Report Status 05/05/2017 FINAL  Final  Culture, body fluid-bottle     Status: None   Collection Time: 05/05/17  8:27 PM  Result Value Ref Range Status   Specimen Description FLUID SYNOVIAL RIGHT KNEE  Final   Special Requests   Final    BOTTLES DRAWN AEROBIC AND ANAEROBIC Blood Culture adequate volume   Culture NO GROWTH 5 DAYS  Final   Report Status 05/10/2017 FINAL  Final  Stat Gram stain     Status: None   Collection Time: 05/07/17  3:03 PM  Result Value Ref Range Status   Specimen Description FLUID SYNOVIAL RIGHT KNEE  Final   Special Requests NONE  Final   Gram Stain   Final    ABUNDANT WBC PRESENT, PREDOMINANTLY PMN NO ORGANISMS SEEN    Report Status 05/07/2017 FINAL  Final    RADIOLOGY:  Mr Jeri Cos JO Contrast  Result Date: 07/03/2017 CLINICAL DATA:  Initial evaluation for chronic headache. EXAM: MRI HEAD WITHOUT AND WITH CONTRAST TECHNIQUE: Multiplanar, multiecho pulse sequences of the brain and surrounding structures were obtained without and with intravenous contrast. CONTRAST:  46m MULTIHANCE GADOBENATE DIMEGLUMINE 529 MG/ML IV SOLN COMPARISON:  None available. FINDINGS: Brain: Cerebral volume normal for age. No focal parenchymal signal abnormality identified. No significant cerebral white matter changes. No abnormal foci of restricted diffusion to suggest acute or subacute ischemia. Gray-white matter differentiation maintained. No evidence for chronic infarction. No evidence for acute or chronic intracranial hemorrhage. No mass lesion, midline shift or mass effect. No hydrocephalus. No  extra-axial fluid collection. Major dural sinuses are grossly patent. No abnormal enhancement. Pituitary gland suprasellar region within normal limits. Midline structures intact and normal. Vascular: Major intracranial vascular flow voids are maintained. Skull and upper cervical spine: Craniocervical junction normal. Upper cervical spine within normal limits. Marrow signal intensity within normal limits. No focal osseous lesions. Scalp soft tissues unremarkable. Sinuses/Orbits: Globes and orbital soft tissues within normal  limits. Paranasal sinuses are clear. No mastoid effusion. Inner ear structures normal. Other: None. IMPRESSION: Normal MRI of the brain. No acute intracranial abnormality identified. Electronically Signed   By: Jeannine Boga M.D.   On: 07/03/2017 21:48   Nm Pet Image Initial (pi) Skull Base To Thigh  Result Date: 07/04/2017 CLINICAL DATA:  Initial treatment strategy for splenomegaly and thoracoabdominal lymphadenopathy. EXAM: NUCLEAR MEDICINE PET SKULL BASE TO THIGH TECHNIQUE: 12.8 mCi F-18 FDG was injected intravenously. Full-ring PET imaging was performed from the skull base to thigh after the radiotracer. CT data was obtained and used for attenuation correction and anatomic localization. FASTING BLOOD GLUCOSE:  Value: 79 mg/dl COMPARISON:  07/03/2017 chest CT.  07/02/2017 CT abdomen/ pelvis. FINDINGS: NECK: There are mildly hypermetabolic mildly enlarged lymph nodes in the bilateral neck at levels 3 and 4. For example, a 0.8 cm left level 3 lymph node with max SUV 3.5 (series 3/image 61) and a 1.2 cm left level 4 neck lymph node with max SUV 3.6 (series 3/image 73). CHEST: Mildly enlarged and mildly hypermetabolic bilateral paratracheal lymph nodes, for example 1.0 cm left paratracheal node with max SUV 3.1 (series 3/image 92). Enlarged hypermetabolic 1.2 cm subcarinal node with max SUV 5.1 (series 3/image 108). Mildly enlarged hypermetabolic 1.0 cm AP window node with max SUV 3.0  (series 3/image 92). Enlarged hypermetabolic 1.4 cm right pericardiophrenic node with max SUV 3.8 (series 3/image 125). No hypermetabolic axillary or hilar lymph nodes. Coarsely calcified left infrahilar nodes from prior granulomatous disease. subcentimeter calcified left lower lobe granuloma. Subsegmental lingular and bilateral lower lobe atelectasis. No acute consolidative airspace disease, lung masses or significant pulmonary nodules. Left anterior descending coronary atherosclerosis. No pneumothorax. No pleural effusions. ABDOMEN/PELVIS: Moderately enlarged (craniocaudal splenic length 20.5 cm) uniformly hypermetabolic spleen with max SUV 8.8 (compared to liver max SUV 6.1). No splenic masses. Bulky hypermetabolic portacaval, porta hepatis, para celiac, aortocaval and left para-aortic lymphadenopathy. Representative nodes as follows: - portacaval 3.8 cm node with max SUV 8.1 (series 3/image 171) - porta hepatis 2.3 cm node with max SUV 7.4 (series 3/image 157) - para celiac 1.5 cm node with max SUV 6.3 (series 3/image 162) - aortocaval 1.2 cm node with max SUV 5.1 (series 3/image 173) - left para- aortic 1.2 cm node with max SUV 3.9 (series 3/image 185) No abnormal hypermetabolic activity within the liver, pancreas or adrenal glands. No enlarged or hypermetabolic pelvic lymph nodes. Subacute hemoperitoneum in the deep pelvis is decreased in density and stable in volume. Atherosclerotic nonaneurysmal abdominal aorta. SKELETON: There is diffuse marrow hypermetabolism throughout the axial and proximal appendicular skeleton, with representative max SUV 7.5 in the L1 vertebral body. IMPRESSION: 1. Hypermetabolic bilateral neck, mediastinal and extensive retroperitoneal adenopathy. Moderately enlarged hypermetabolic spleen. Diffuse marrow hypermetabolism in the axial and proximal appendicular skeleton. 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 2. Mild bibasilar atelectasis. 3. Expected evolution of  small volume subacute hemoperitoneum in the deep pelvis. 4. One vessel coronary atherosclerosis. 5.  Aortic Atherosclerosis (ICD10-I70.0). Electronically Signed   By: Ilona Sorrel M.D.   On: 07/04/2017 12:04     Management plans discussed with the patient, family, Dr Janese Banks and they are in agreement.  CODE STATUS: Full Code   TOTAL TIME TAKING CARE OF THIS PATIENT: 45 minutes.    Max Sane M.D on 07/04/2017 at 6:25 PM  Between 7am to 6pm - Pager - [phone]  After 6pm go to [email or web address] - [facility]  Office  [phone]  CC: Primary care physician; Venia Carbon, MD   Note: This dictation was prepared with Dragon dictation along with smaller phrase technology. Any transcriptional errors that result from this process are unintentional.

## 2017-07-04 NOTE — Progress Notes (Signed)
[facility] at [place] NAME: Mark Burns    MR#:  [id number]  DATE OF BIRTH:  06-16-1981  SUBJECTIVE:  CHIEF COMPLAINT:   Chief Complaint  Patient presents with  . Abdominal Pain  In tears, overwhelmed to hear all this new information and possible lymphoma diagnosis, concerned at the same time for overall outcome, has some pain on his left lower quadrant wife at bedside REVIEW OF SYSTEMS:  Review of Systems  Constitutional: Negative for chills, fever and weight loss.  HENT: Negative for nosebleeds and sore throat.   Eyes: Negative for blurred vision.  Respiratory: Negative for cough, shortness of breath and wheezing.   Cardiovascular: Negative for chest pain, orthopnea, leg swelling and PND.  Gastrointestinal: Positive for abdominal pain. Negative for constipation, diarrhea, heartburn, nausea and vomiting.  Genitourinary: Negative for dysuria and urgency.  Musculoskeletal: Negative for back pain.  Skin: Negative for rash.  Neurological: Negative for dizziness, speech change, focal weakness and headaches.  Endo/Heme/Allergies: Does not bruise/bleed easily.  Psychiatric/Behavioral: Negative for depression.   DRUG ALLERGIES:   Allergies  Allergen Reactions  . Sulfa Antibiotics Hives   VITALS:  Blood pressure 134/75, pulse 85, temperature 98.2 F (36.8 C), temperature source Oral, resp. rate 18, height _0  (1.88 m), weight 115.9 kg (255 lb 9.6 oz), SpO2 93 %. PHYSICAL EXAMINATION:  Physical Exam  Constitutional: He is oriented to person, place, and time and well-developed, well-nourished, and in no distress.  HENT:  Head: Normocephalic and atraumatic.  Eyes: Pupils are equal, round, and reactive to light. Conjunctivae and EOM are normal.  Neck: Normal range of motion. Neck supple. No tracheal deviation present. No thyromegaly present.  Cardiovascular: Normal rate, regular rhythm and normal heart sounds.   Pulmonary/Chest:  Effort normal and breath sounds normal. No respiratory distress. He has no wheezes. He exhibits no tenderness.  Abdominal: Soft. Bowel sounds are normal. He exhibits no distension. There is tenderness in the left lower quadrant.  Musculoskeletal: Normal range of motion.  Neurological: He is alert and oriented to person, place, and time. No cranial nerve deficit.  Skin: Skin is warm and dry. No rash noted.  Psychiatric: Mood and affect normal.   LABORATORY PANEL:  Male CBC  Recent Labs Lab 07/04/17 0538  WBC 30.5*  HGB 12.2*  HCT 34.2*  PLT 55*   ------------------------------------------------------------------------------------------------------------------ Chemistries   Recent Labs Lab 07/04/17 0538  NA 141  K 3.6  CL 104  CO2 28  GLUCOSE 90  BUN 16  CREATININE 1.11  CALCIUM 8.3*  AST 214*  ALT 51  ALKPHOS 66  BILITOT 1.3*   RADIOLOGY:  Mr Jeri Cos Wo Contrast  Result Date: 07/03/2017 CLINICAL DATA:  Initial evaluation for chronic headache. EXAM: MRI HEAD WITHOUT AND WITH CONTRAST TECHNIQUE: Multiplanar, multiecho pulse sequences of the brain and surrounding structures were obtained without and with intravenous contrast. CONTRAST:  91m MULTIHANCE GADOBENATE DIMEGLUMINE 529 MG/ML IV SOLN COMPARISON:  None available. FINDINGS: Brain: Cerebral volume normal for age. No focal parenchymal signal abnormality identified. No significant cerebral white matter changes. No abnormal foci of restricted diffusion to suggest acute or subacute ischemia. Gray-white matter differentiation maintained. No evidence for chronic infarction. No evidence for acute or chronic intracranial hemorrhage. No mass lesion, midline shift or mass effect. No hydrocephalus. No extra-axial fluid collection. Major dural sinuses are grossly patent. No abnormal enhancement. Pituitary gland suprasellar region within normal limits. Midline structures  intact and normal. Vascular: Major intracranial vascular flow  voids are maintained. Skull and upper cervical spine: Craniocervical junction normal. Upper cervical spine within normal limits. Marrow signal intensity within normal limits. No focal osseous lesions. Scalp soft tissues unremarkable. Sinuses/Orbits: Globes and orbital soft tissues within normal limits. Paranasal sinuses are clear. No mastoid effusion. Inner ear structures normal. Other: None. IMPRESSION: Normal MRI of the brain. No acute intracranial abnormality identified. Electronically Signed   By: Jeannine Boga M.D.   On: 07/03/2017 21:48   Nm Pet Image Initial (pi) Skull Base To Thigh  Result Date: 07/04/2017 CLINICAL DATA:  Initial treatment strategy for splenomegaly and thoracoabdominal lymphadenopathy. EXAM: NUCLEAR MEDICINE PET SKULL BASE TO THIGH TECHNIQUE: 12.8 mCi F-18 FDG was injected intravenously. Full-ring PET imaging was performed from the skull base to thigh after the radiotracer. CT data was obtained and used for attenuation correction and anatomic localization. FASTING BLOOD GLUCOSE:  Value: 79 mg/dl COMPARISON:  07/03/2017 chest CT.  07/02/2017 CT abdomen/ pelvis. FINDINGS: NECK: There are mildly hypermetabolic mildly enlarged lymph nodes in the bilateral neck at levels 3 and 4. For example, a 0.8 cm left level 3 lymph node with max SUV 3.5 (series 3/image 61) and a 1.2 cm left level 4 neck lymph node with max SUV 3.6 (series 3/image 73). CHEST: Mildly enlarged and mildly hypermetabolic bilateral paratracheal lymph nodes, for example 1.0 cm left paratracheal node with max SUV 3.1 (series 3/image 92). Enlarged hypermetabolic 1.2 cm subcarinal node with max SUV 5.1 (series 3/image 108). Mildly enlarged hypermetabolic 1.0 cm AP window node with max SUV 3.0 (series 3/image 92). Enlarged hypermetabolic 1.4 cm right pericardiophrenic node with max SUV 3.8 (series 3/image 125). No hypermetabolic axillary or hilar lymph nodes. Coarsely calcified left infrahilar nodes from prior granulomatous  disease. subcentimeter calcified left lower lobe granuloma. Subsegmental lingular and bilateral lower lobe atelectasis. No acute consolidative airspace disease, lung masses or significant pulmonary nodules. Left anterior descending coronary atherosclerosis. No pneumothorax. No pleural effusions. ABDOMEN/PELVIS: Moderately enlarged (craniocaudal splenic length 20.5 cm) uniformly hypermetabolic spleen with max SUV 8.8 (compared to liver max SUV 6.1). No splenic masses. Bulky hypermetabolic portacaval, porta hepatis, para celiac, aortocaval and left para-aortic lymphadenopathy. Representative nodes as follows: - portacaval 3.8 cm node with max SUV 8.1 (series 3/image 171) - porta hepatis 2.3 cm node with max SUV 7.4 (series 3/image 157) - para celiac 1.5 cm node with max SUV 6.3 (series 3/image 162) - aortocaval 1.2 cm node with max SUV 5.1 (series 3/image 173) - left para- aortic 1.2 cm node with max SUV 3.9 (series 3/image 185) No abnormal hypermetabolic activity within the liver, pancreas or adrenal glands. No enlarged or hypermetabolic pelvic lymph nodes. Subacute hemoperitoneum in the deep pelvis is decreased in density and stable in volume. Atherosclerotic nonaneurysmal abdominal aorta. SKELETON: There is diffuse marrow hypermetabolism throughout the axial and proximal appendicular skeleton, with representative max SUV 7.5 in the L1 vertebral body. IMPRESSION: 1. Hypermetabolic bilateral neck, mediastinal and extensive retroperitoneal adenopathy. Moderately enlarged hypermetabolic spleen. Diffuse marrow hypermetabolism in the axial and proximal appendicular skeleton. 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 2. Mild bibasilar atelectasis. 3. Expected evolution of small volume subacute hemoperitoneum in the deep pelvis. 4. One vessel coronary atherosclerosis. 5.  Aortic Atherosclerosis (ICD10-I70.0). Electronically Signed   By: Ilona Sorrel M.D.   On: 07/04/2017 12:04   ASSESSMENT AND PLAN:    Patient is a 36 y.o. male presenting with abdominal pain found to have adenopathy, splenomegaly and acute  kidney injury likely from tumor lysis syndrome  * AKI: resolved with intravenous fluids. Avoid nephrotoxic agents. Creat 2.4->1.1 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 -Nephrology following  *Suspected lymphoma:splenomegaly, elevated LDH and uric acid, leukocytosis with atypical neutrophils and lymphocyte, thrombocytopenia -Appreciate oncology input -he may need port to start getting chemotherapy - s/p 1 dose of rasburicase  -s/p bone marrow biopsy this morning - PET/CT this morning suggestive of lymphoproliferative disorder - HIV and hepatitis negative  * spontaneous tumor lysis syndrome -  s/p 1 dose of rasburicase  - checking daily CMP, LDH, uric acid and phosphorus as per oncology recommendation - continue allopurinol 100 mg TID for prophylaxis  * Enlarged spleen/splenic laceration:  Likely nonsurgical at this time. Continue to follow platelet count to monitor for sequestration. -Consult surgery  *Shortness of breath - echo within normal limits - CT chest without contrast history of lymphoproliferative disorder as well     All the records are reviewed and case discussed with Care Management/Social Worker. Management plans discussed with the patient, nursing and they are in agreement.  CODE STATUS: Full Code  TOTAL TIME TAKING CARE OF THIS PATIENT: 35 minutes.   More than 50% of the time was spent in counseling/coordination of care: YES  POSSIBLE D/C IN 3-4 DAYS, DEPENDING ON CLINICAL CONDITION.  And oncology evaluation   Max Sane M.D on 07/04/2017 at 1:55 PM  Between 7am to 6pm - Pager - [phone]  After 6pm go to [email or web address] - Proofreader  [facility]  Office  [phone]  CC: Primary care physician; Venia Carbon, MD  Note: This dictation was prepared with Dragon dictation along with smaller phrase  technology. Any transcriptional errors that result from this process are unintentional.
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
## 2017-07-04 NOTE — Progress Notes (Signed)
SURGICAL PROGRESS NOTE (cpt [phone])  Hospital Day(s): 2.   Post op day(s):  Mark Burns   Interval History: Patient seen and examined, no acute events or new complaints overnight. Patient reports some anxiety regarding diagnosis, but seems to appreciate recognition of diagnosis and is eager to begin treatment upon confirmation of diagnosis, reports his LUQ abdominal pain has been controlled and slowly improving, and denies fever/chills, N/V, CP, or SOB.  Review of Systems:  Constitutional: denies fever, chills  HEENT: denies cough or congestion  Respiratory: denies any shortness of breath  Cardiovascular: denies chest pain or palpitations  Gastrointestinal: abdominal pain, N/V, and bowel function as per interval history Genitourinary: denies burning with urination or urinary frequency Musculoskeletal: denies pain, decreased motor or sensation Integumentary: denies any other rashes or skin discolorations Neurological: denies HA or vision/hearing changes   Vital signs in last 24 hours: [min-max] current  Temp:  [98.3 F (36.8 C)-98.7 F (37.1 C)] 98.3 F (36.8 C) (09/20 0626) Pulse Rate:  [87-100] 87 (09/20 0627) Resp:  [18-32] 20 (09/20 0626) BP: (125-140)/(75-84) 125/75 (09/20 0626) SpO2:  [89 %-97 %] 91 % (09/20 0627) Weight:  [255 lb 9.6 oz (115.9 kg)] 255 lb 9.6 oz (115.9 kg) (09/20 0500)     Height: 6\' 2"  (188 cm) Weight: 255 lb 9.6 oz (115.9 kg) BMI (Calculated): 32.8   Intake/Output this shift:  No intake/output data recorded.   Intake/Output last 2 shifts:  @IOLAST2SHIFTS @   Physical Exam:  Constitutional: alert, cooperative and no distress  HENT: normocephalic without obvious abnormality  Eyes: PERRL, EOM's grossly intact and symmetric  Neuro: CN II - XII grossly intact and symmetric without deficit  Respiratory: breathing non-labored at rest  Cardiovascular: regular rate and sinus rhythm  Gastrointestinal: soft and non-distended with mild-/moderate- LUQ abdominal  tenderness to palpation Musculoskeletal: UE and LE FROM, no edema or wounds, motor and sensation grossly intact, NT   Labs:  CBC Latest Ref Rng & Units 07/04/2017 07/02/2017 07/02/2017  WBC 3.8 - 10.6 K/uL 30.5(H) 34.1(H) 41.3(H)  Hemoglobin 13.0 - 18.0 g/dL 12.2(L) 12.5(L) 14.5  Hematocrit 40.0 - 52.0 % 34.2(L) 36.1(L) 42.0  Platelets 150 - 440 K/uL 55(L) 70(L) 89(L)   CMP Latest Ref Rng & Units 07/04/2017 07/03/2017 07/02/2017  Glucose 65 - 99 mg/dL 90 95 106(H)  BUN 6 - 20 mg/dL 16 22(H) 19  Creatinine 0.61 - 1.24 mg/dL 1.11 2.40(H) 2.43(H)  Sodium 135 - 145 mmol/L 141 141 137  Potassium 3.5 - 5.1 mmol/L 3.6 3.4(L) 3.5  Chloride 101 - 111 mmol/L 104 103 98(L)  CO2 22 - 32 mmol/L 28 28 25   Calcium 8.9 - 10.3 mg/dL 8.3(L) 7.9(L) 9.0  Total Protein 6.5 - 8.1 g/dL 6.2(L) 6.4(L) -  Total Bilirubin 0.3 - 1.2 mg/dL 1.3(H) 0.9 -  Alkaline Phos 38 - 126 U/L 66 52 -  AST 15 - 41 U/L 214(H) 160(H) -  ALT 17 - 63 U/L 51 47 -   Peripheral blood smear (07/02/2017) Blood smear reviewed at the double headed microscope with Dr. Janese Banks. There is moderate thrombocytopenia. RBCs are unremarkable. There is leukocytosis with predominantly medium sized lymphocytes with round nuclei and small nucleoli. There are occasional  blast like cells. A few myelocytes are also noted. Correlation with flow cytometry immunophenotype and bone marrow exam is advised.    Imaging studies:  MRI Brain (07/03/2017) Normal MRI of the brain. No acute intracranial abnormality Identified.  CT Chest Without Contrast (07/03/2017) Multi station supraclavicular, mediastinal, and axillary lymphadenopathy  with marked splenomegaly, concerning for lymphoproliferative disorder.   Assessment/Plan: (ICD-10's: S49.030A) 36 y.o. male with subacute splenic and pelvic hematomas with subtle splenic laceration attributed to recent mild blunt abdominal trauma in context of splenomegaly, thrombocytopenia, diffuse lymphadenopathy, and leukocytosis  concerning for and likely attributable to presumptive lymphoma, possible leukemia, complicated by likely dilutional anemia +/- acute blood loss +/- chronic anemia (Hb 10.7, 05/07/2017), resolved AKI, and by pertinent comorbidities including HTN, gout, generalized anxiety disorder and ADHD.   - Hb stable  - pain control prn  - no surgical intervention indicated at this time  - will continue to follow peripherally for port placement if/when indicated   - medical management of comorbidities as per medical and oncology teams  - please call if any questions or if port is requested  All of the above findings and recommendations were discussed with the patient and patient's family, and all of patient's and family's questions were answered to their expressed satisfaction.  Thank you for the opportunity to participate in this patient's care.  -- Marilynne Drivers Rosana Hoes, MD, [place]: [facility] - Partnering for exceptional care. Office: [phone]
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
## 2017-07-04 NOTE — Progress Notes (Signed)
Patient has been accepted at [facility] by Hospitalist Dr Ignacia Bayley. Waiting for beds.  D/w Dr Janese Banks

## 2017-07-05 LAB — RASBURICASE - URIC ACID: Uric Acid, Serum: 3.5 mg/dL — ABNORMAL LOW (ref 4.4–7.6)

## 2017-07-05 LAB — COMPREHENSIVE METABOLIC PANEL
ALBUMIN: 3.4 g/dL — AB (ref 3.5–5.0)
ALK PHOS: 76 U/L (ref 38–126)
ALT: 50 U/L (ref 17–63)
ANION GAP: 10 (ref 5–15)
AST: 208 U/L — ABNORMAL HIGH (ref 15–41)
BUN: 13 mg/dL (ref 6–20)
CALCIUM: 8.2 mg/dL — AB (ref 8.9–10.3)
CHLORIDE: 104 mmol/L (ref 101–111)
CO2: 27 mmol/L (ref 22–32)
Creatinine, Ser: 0.94 mg/dL (ref 0.61–1.24)
GFR calc non Af Amer: >60 mL/min (ref >60)
GLUCOSE: 95 mg/dL (ref 65–99)
Potassium: 3.5 mmol/L (ref 3.5–5.1)
Sodium: 141 mmol/L (ref 135–145)
Total Bilirubin: 1.5 mg/dL — ABNORMAL HIGH (ref 0.3–1.2)
Total Protein: 6.1 g/dL — ABNORMAL LOW (ref 6.5–8.1)

## 2017-07-05 LAB — CBC
HEMATOCRIT: 31.8 % — AB (ref 40.0–52.0)
Hemoglobin: 11.1 g/dL — ABNORMAL LOW (ref 13.0–18.0)
MCH: 29.1 pg (ref 26.0–34.0)
MCHC: 34.9 g/dL (ref 32.0–36.0)
MCV: 83.2 fL (ref 80.0–100.0)
Platelets: 60 10*3/uL — ABNORMAL LOW (ref 150–440)
RBC: 3.82 MIL/uL — AB (ref 4.40–5.90)
RDW: 14.3 % (ref 11.5–14.5)
WBC: 36.8 10*3/uL — AB (ref 3.8–10.6)

## 2017-07-05 LAB — LACTATE DEHYDROGENASE: LDH: 8328 U/L — ABNORMAL HIGH (ref 98–192)

## 2017-07-05 LAB — PHOSPHORUS: Phosphorus: 2.8 mg/dL (ref 2.5–4.6)

## 2017-07-05 MED ORDER — HYDROMORPHONE HCL 1 MG/ML IJ SOLN: 2.0000 mg | Freq: Once | INTRAMUSCULAR | Status: DC

## 2017-07-05 MED ORDER — HYDROMORPHONE HCL 1 MG/ML IJ SOLN
2.0000 mg | INTRAMUSCULAR | Status: DC | PRN
  Administered 2017-07-05 (×2): 2 mg via INTRAVENOUS
  Filled 2017-07-05 (×2): qty 2

## 2017-07-05 NOTE — Progress Notes (Signed)
[facility]  ROUNDING NOTE   Subjective:  Renal function continues to improve. Creatinine down to 0.94. He is in the process of being transferred to [facility].  Objective:  Vital signs in last 24 hours:  Temp:  [98 F (36.7 C)-98.7 F (37.1 C)] 98.2 F (36.8 C) (09/21 0420) Pulse Rate:  [85-100] 100 (09/21 0420) Resp:  [13-18] 13 (09/21 0420) BP: (129-141)/(75-91) 136/91 (09/21 0420) SpO2:  [92 %-95 %] 92 % (09/21 0420) Weight:  [119.7 kg (263 lb 12.8 oz)] 119.7 kg (263 lb 12.8 oz) (09/21 0500)  Weight change: 3.719 kg (8 lb 3.2 oz) Filed Weights   07/02/17 2029 07/04/17 0500 07/05/17 0500  Weight: 116.4 kg (256 lb 11.2 oz) 115.9 kg (255 lb 9.6 oz) 119.7 kg (263 lb 12.8 oz)    Intake/Output: I/O last 3 completed shifts: In: 1448 [I.V.:6140] Out: -    Intake/Output this shift:  No intake/output data recorded.  Physical Exam: General: No acute distress  Head: Normocephalic, atraumatic. Moist oral mucosal membranes  Eyes: Anicteric  Neck: Supple, trachea midline  Lungs:  Clear to auscultation, normal effort  Heart: S1S2 no rubs  Abdomen:  Soft, nontender, splenomegaly  Extremities: Trace peripheral edema.  Neurologic: Awake, alert, following commands  Skin: No lesions       Basic Metabolic Panel:  Recent Labs Lab 07/02/17 1527 07/02/17 1728 07/03/17 0415 07/04/17 0538 07/05/17 0421  NA 138 137 141 141 141  K 3.4* 3.5 3.4* 3.6 3.5  CL 98* 98* 103 104 104  CO2 26 25 28 28 27   GLUCOSE 120* 106* 95 90 95  BUN 20 19 22* 16 13  CREATININE 2.28* 2.43* 2.40* 1.11 0.94  CALCIUM 9.8 9.0 7.9* 8.3* 8.2*  PHOS  --   --  4.6 3.0 2.8    Liver Function Tests:  Recent Labs Lab 07/02/17 1527 07/03/17 0415 07/04/17 0538 07/05/17 0421  AST 225* 160* 214* 208*  ALT 69* 47 51 50  ALKPHOS 74 52 66 76  BILITOT 1.1 0.9 1.3* 1.5*  PROT 8.8* 6.4* 6.2* 6.1*  ALBUMIN 5.3* 3.7 3.5 3.4*    Recent Labs Lab 07/02/17 1527  LIPASE 24   No results  for input(s): AMMONIA in the last 168 hours.  CBC:  Recent Labs Lab 07/02/17 1527 07/02/17 2139 07/04/17 0538 07/05/17 0421  WBC 41.3* 34.1* 30.5* 36.8*  NEUTROABS 21.9*  --   --   --   HGB 14.5 12.5* 12.2* 11.1*  HCT 42.0 36.1* 34.2* 31.8*  MCV 82.4 82.8 84.3 83.2  PLT 89* 70* 55* 60*    Cardiac Enzymes: No results for input(s): CKTOTAL, CKMB, CKMBINDEX, TROPONINI in the last 168 hours.  BNP: Invalid input(s): POCBNP  CBG:  Recent Labs Lab 07/04/17 0829  GLUCAP 91    Microbiology: Results for orders placed or performed during the hospital encounter of 05/05/17  Culture, blood (Routine X 2) w Reflex to ID Panel     Status: None   Collection Time: 05/05/17  4:00 PM  Result Value Ref Range Status   Specimen Description BLOOD RIGHT ANTECUBITAL  Final   Special Requests   Final    BOTTLES DRAWN AEROBIC AND ANAEROBIC Blood Culture adequate volume   Culture NO GROWTH 5 DAYS  Final   Report Status 05/10/2017 FINAL  Final  Culture, blood (Routine X 2) w Reflex to ID Panel     Status: None   Collection Time: 05/05/17  4:10 PM  Result Value Ref Range  Status   Specimen Description BLOOD RIGHT ARM  Final   Special Requests   Final    BOTTLES DRAWN AEROBIC ONLY Blood Culture adequate volume   Culture NO GROWTH 5 DAYS  Final   Report Status 05/10/2017 FINAL  Final  Gram stain     Status: None   Collection Time: 05/05/17  8:27 PM  Result Value Ref Range Status   Specimen Description FLUID SYNOVIAL RIGHT KNEE  Final   Special Requests Normal  Final   Gram Stain   Final    MODERATE WBC PRESENT, PREDOMINANTLY PMN NO ORGANISMS SEEN    Report Status 05/05/2017 FINAL  Final  Culture, body fluid-bottle     Status: None   Collection Time: 05/05/17  8:27 PM  Result Value Ref Range Status   Specimen Description FLUID SYNOVIAL RIGHT KNEE  Final   Special Requests   Final    BOTTLES DRAWN AEROBIC AND ANAEROBIC Blood Culture adequate volume   Culture NO GROWTH 5 DAYS  Final    Report Status 05/10/2017 FINAL  Final  Stat Gram stain     Status: None   Collection Time: 05/07/17  3:03 PM  Result Value Ref Range Status   Specimen Description FLUID SYNOVIAL RIGHT KNEE  Final   Special Requests NONE  Final   Gram Stain   Final    ABUNDANT WBC PRESENT, PREDOMINANTLY PMN NO ORGANISMS SEEN    Report Status 05/07/2017 FINAL  Final    Coagulation Studies:  Recent Labs  07/02/17 1527  LABPROT 14.7  INR 1.16    Urinalysis:  Recent Labs  07/04/17 0357  COLORURINE YELLOW*  LABSPEC 1.010  PHURINE 5.0  GLUCOSEU NEGATIVE  HGBUR SMALL*  BILIRUBINUR NEGATIVE  KETONESUR NEGATIVE  PROTEINUR NEGATIVE  NITRITE NEGATIVE  LEUKOCYTESUR NEGATIVE      Imaging: Ct Chest Wo Contrast  Result Date: 07/03/2017 CLINICAL DATA:  Shortness of breath. EXAM: CT CHEST WITHOUT CONTRAST TECHNIQUE: Multidetector CT imaging of the chest was performed following the standard protocol without IV contrast. COMPARISON:  Chest x-ray dated April 01, 2010. FINDINGS: Cardiovascular: No significant vascular findings. Normal heart size. No pericardial effusion. Normal thoracic aorta. Mild left anterior descending coronary artery atherosclerotic calcifications. Mediastinum/Nodes: Prominent lower cervical and left supraclavicular lymph nodes measuring up to 12 mm in short axis. Prominent bilateral axillary lymph nodes measuring up to 12 mm in short axis. Prominent mediastinal lymph nodes, with an enlarged subcarinal lymph node measuring 14 mm in short axis. Enlarged right cardiophrenic lymph node measuring up to 13 mm in short axis. Calcified left hilar lymph nodes. The thyroid, esophagus, and trachea are unremarkable. Lungs/Pleura: Bibasilar and lingular atelectasis/scarring. No suspicious pulmonary nodule. Calcified granuloma in the left lower lobe. No pleural effusion or pneumothorax. Upper Abdomen: Prominent splenomegaly, measuring up to 20 cm in AP dimension. Hepatic steatosis. Musculoskeletal: No  chest wall mass or suspicious bone lesions identified. No fracture. IMPRESSION: 1. 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 2. Hepatic steatosis. Electronically Signed   By: Titus Dubin M.D.   On: 07/03/2017 12:08   Mr Jeri Cos EX Contrast  Result Date: 07/03/2017 CLINICAL DATA:  Initial evaluation for chronic headache. EXAM: MRI HEAD WITHOUT AND WITH CONTRAST TECHNIQUE: Multiplanar, multiecho pulse sequences of the brain and surrounding structures were obtained without and with intravenous contrast. CONTRAST:  89m MULTIHANCE GADOBENATE DIMEGLUMINE 529 MG/ML IV SOLN COMPARISON:  None available. FINDINGS: Brain: Cerebral volume normal for age. No focal parenchymal  signal abnormality identified. No significant cerebral white matter changes. No abnormal foci of restricted diffusion to suggest acute or subacute ischemia. Gray-white matter differentiation maintained. No evidence for chronic infarction. No evidence for acute or chronic intracranial hemorrhage. No mass lesion, midline shift or mass effect. No hydrocephalus. No extra-axial fluid collection. Major dural sinuses are grossly patent. No abnormal enhancement. Pituitary gland suprasellar region within normal limits. Midline structures intact and normal. Vascular: Major intracranial vascular flow voids are maintained. Skull and upper cervical spine: Craniocervical junction normal. Upper cervical spine within normal limits. Marrow signal intensity within normal limits. No focal osseous lesions. Scalp soft tissues unremarkable. Sinuses/Orbits: Globes and orbital soft tissues within normal limits. Paranasal sinuses are clear. No mastoid effusion. Inner ear structures normal. Other: None. IMPRESSION: Normal MRI of the brain. No acute intracranial abnormality identified. Electronically Signed   By: Jeannine Boga M.D.   On: 07/03/2017 21:48   Nm Pet  Image Initial (pi) Skull Base To Thigh  Result Date: 07/04/2017 CLINICAL DATA:  Initial treatment strategy for splenomegaly and thoracoabdominal lymphadenopathy. EXAM: NUCLEAR MEDICINE PET SKULL BASE TO THIGH TECHNIQUE: 12.8 mCi F-18 FDG was injected intravenously. Full-ring PET imaging was performed from the skull base to thigh after the radiotracer. CT data was obtained and used for attenuation correction and anatomic localization. FASTING BLOOD GLUCOSE:  Value: 79 mg/dl COMPARISON:  07/03/2017 chest CT.  07/02/2017 CT abdomen/ pelvis. FINDINGS: NECK: There are mildly hypermetabolic mildly enlarged lymph nodes in the bilateral neck at levels 3 and 4. For example, a 0.8 cm left level 3 lymph node with max SUV 3.5 (series 3/image 61) and a 1.2 cm left level 4 neck lymph node with max SUV 3.6 (series 3/image 73). CHEST: Mildly enlarged and mildly hypermetabolic bilateral paratracheal lymph nodes, for example 1.0 cm left paratracheal node with max SUV 3.1 (series 3/image 92). Enlarged hypermetabolic 1.2 cm subcarinal node with max SUV 5.1 (series 3/image 108). Mildly enlarged hypermetabolic 1.0 cm AP window node with max SUV 3.0 (series 3/image 92). Enlarged hypermetabolic 1.4 cm right pericardiophrenic node with max SUV 3.8 (series 3/image 125). No hypermetabolic axillary or hilar lymph nodes. Coarsely calcified left infrahilar nodes from prior granulomatous disease. subcentimeter calcified left lower lobe granuloma. Subsegmental lingular and bilateral lower lobe atelectasis. No acute consolidative airspace disease, lung masses or significant pulmonary nodules. Left anterior descending coronary atherosclerosis. No pneumothorax. No pleural effusions. ABDOMEN/PELVIS: Moderately enlarged (craniocaudal splenic length 20.5 cm) uniformly hypermetabolic spleen with max SUV 8.8 (compared to liver max SUV 6.1). No splenic masses. Bulky hypermetabolic portacaval, porta hepatis, para celiac, aortocaval and left para-aortic  lymphadenopathy. Representative nodes as follows: - portacaval 3.8 cm node with max SUV 8.1 (series 3/image 171) - porta hepatis 2.3 cm node with max SUV 7.4 (series 3/image 157) - para celiac 1.5 cm node with max SUV 6.3 (series 3/image 162) - aortocaval 1.2 cm node with max SUV 5.1 (series 3/image 173) - left para- aortic 1.2 cm node with max SUV 3.9 (series 3/image 185) No abnormal hypermetabolic activity within the liver, pancreas or adrenal glands. No enlarged or hypermetabolic pelvic lymph nodes. Subacute hemoperitoneum in the deep pelvis is decreased in density and stable in volume. Atherosclerotic nonaneurysmal abdominal aorta. SKELETON: There is diffuse marrow hypermetabolism throughout the axial and proximal appendicular skeleton, with representative max SUV 7.5 in the L1 vertebral body. IMPRESSION: 1. Hypermetabolic bilateral neck, mediastinal and extensive retroperitoneal adenopathy. Moderately enlarged hypermetabolic spleen. Diffuse marrow hypermetabolism in the axial and proximal appendicular skeleton. 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 2. Mild bibasilar atelectasis. 3. Expected evolution of small volume subacute hemoperitoneum in the deep pelvis. 4. One vessel coronary atherosclerosis. 5.  Aortic Atherosclerosis (ICD10-I70.0). Electronically Signed   By: Ilona Sorrel M.D.   On: 07/04/2017 12:04   Ct Bone Marrow Biopsy & Aspiration  Result Date: 07/03/2017 INDICATION: Concern for lymphoma. Please perform CT-guided biopsy bone marrow biopsy for tissue diagnostic purposes. EXAM: CT-GUIDED BONE MARROW BIOPSY AND ASPIRATION MEDICATIONS: None ANESTHESIA/SEDATION: Fentanyl 175 mcg IV; Versed 3 mg IV Sedation Time: 11 Minutes; The patient was continuously monitored during the procedure by the interventional radiology nurse under my direct supervision. COMPLICATIONS: None immediate. PROCEDURE: Informed consent was obtained from the patient following an explanation of the procedure,  risks, benefits and alternatives. The patient understands, agrees and consents for the procedure. All questions were addressed. A time out was performed prior to the initiation of the procedure. The patient was positioned prone and non-contrast localization CT was performed of the pelvis to demonstrate the iliac marrow spaces. The operative site was prepped and draped in the usual sterile fashion. Under sterile conditions and local anesthesia, a 22 gauge spinal needle was utilized for procedural planning. Next, an 11 gauge coaxial bone biopsy needle was advanced into the left iliac marrow space. Needle position was confirmed with CT imaging. Initially, bone marrow aspiration was performed. Next, a bone marrow biopsy was obtained with the 11 gauge outer bone marrow device. Samples were prepared with the cytotechnologist and deemed adequate. The needle was removed intact. Hemostasis was obtained with compression and a dressing was placed. The patient tolerated the procedure well without immediate post procedural complication. IMPRESSION: Successful CT guided left iliac bone marrow aspiration and core biopsy. Electronically Signed   By: Sandi Mariscal M.D.   On: 07/03/2017 11:26     Medications:   . sodium chloride 100 mL/hr at 07/05/17 1042   . allopurinol  100 mg Oral TID  . citalopram  40 mg Oral Daily  . DULoxetine  30 mg Oral Daily  . senna-docusate  2 tablet Oral BID   acetaminophen **OR** acetaminophen, ALPRAZolam, aspirin-acetaminophen-caffeine, colchicine, HYDROcodone-acetaminophen, hydrocortisone, HYDROmorphone (DILAUDID) injection, ondansetron **OR** ondansetron (ZOFRAN) IV, pseudoephedrine  Assessment/ Plan:  36 y.o. male with a PMHx of ADHD, erectile dysfunction, generalized anxiety disorder, gout, hypertension, who was admitted to [facility] on 07/02/2017 for evaluation of abdominal pain and significant splenomegaly.   1. 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 2.Hypokalemia. 3.Hypertension. 4. Splenomegaly with leukocytosis and concern for lymphoma/leukemia. 5. Hyperuricemia.   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  PET scan showed rather diffuse involvement. However his renal function has improved as has serum uric acid level. Patient is at very high risk of further tumor lysis syndrome with further treatment. Therefore we recommend continued monitoring of renal function, calcium, phosphorus, and uric acid levels. Patient was given 1 dose of rasburicase. We wish patient the best of luck with his future therapy.    LOS: 3 Kalis Friese 9/21/201811:06 AM

## 2017-07-05 NOTE — Discharge Summary (Signed)
[place] [facility] [place] NAME: Mark Burns    MR#:  [id number]  DATE OF BIRTH:  1981-06-09  DATE OF ADMISSION:  07/02/2017   ADMITTING PHYSICIAN: Harrie Foreman, MD  DATE OF DISCHARGE: 07/05/2017  PRIMARY CARE PHYSICIAN: Venia Carbon, MD   ADMISSION DIAGNOSIS:  Splenomegaly [R16.1] Thrombocytopenia ([place]) [D69.6] Transaminitis [R74.0] AKI (acute kidney injury) ([place]) [N17.9] Abdominal lymphadenopathy [R59.0] Splenic laceration, initial encounter [Q67.619J] Leukocytosis, unspecified type [D72.829] DISCHARGE DIAGNOSIS:  Active Problems:   AKI (acute kidney injury) ([place])   Splenic laceration, initial encounter  SECONDARY DIAGNOSIS:   Past Medical History:  Diagnosis Date  . ADHD, predominantly inattentive type   . Elevated LFTs 11/19/2013  . Erectile dysfunction   . Generalized anxiety disorder   . Gout   . Hypertension   . Recurrent cold sores   . Seborrheic dermatitis    HOSPITAL COURSE:  Patient is a 36 y.o.malepresenting with abdominal pain found to have adenopathy, splenomegaly and acute kidney injury likely from tumor lysis syndrome  * AKI: resolved with intravenous fluids.  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  *Suspected lymphoma:splenomegaly, elevated LDH and uric acid, leukocytosis with atypical neutrophils and lymphocyte, thrombocytopenia -Appreciate oncology input - pt being trasferd to [facility] for further treatment - s/p 1 dose of rasburicase  -s/p bone marrow biopsy worrisome for high grade lymphoma per onco - PET/CT on 07/04/2017 suggestive of lymphoproliferative disorder - HIV and hepatitis negative  * spontaneous tumor lysis syndrome -  s/p 1 dose of rasburicase  - continue allopurinol 100 mg TID for prophylaxis  * Enlarged spleen/splenic laceration: conservative mgmt per surgery.  *Shortness of breath - echo within normal limits - CT chest without contrast history of lymphoproliferative  disorder as well  DISCHARGE CONDITIONS:  fair CONSULTS OBTAINED:  Treatment Team:  Sindy Guadeloupe, MD DRUG ALLERGIES:   Allergies  Allergen Reactions  . Sulfa Antibiotics Hives   DISCHARGE MEDICATIONS:   Allergies as of 07/05/2017      Reactions   Sulfa Antibiotics Hives      Medication List    TAKE these medications   acetaminophen 325 MG tablet Commonly known as:  TYLENOL Take 2 tablets (650 mg total) by mouth every 6 (six) hours as needed for mild pain (or Fever >/= 101).   allopurinol 100 MG tablet Commonly known as:  ZYLOPRIM Take 1 tablet (100 mg total) by mouth 3 (three) times daily.   ALPRAZolam 1 MG tablet Commonly known as:  XANAX Take 1 tablet (1 mg total) by mouth every 8 (eight) hours as needed for anxiety.   citalopram 40 MG tablet Commonly known as:  CELEXA TAKE 1 TABLET (40 MG TOTAL) BY MOUTH DAILY.   colchicine 0.6 MG tablet Take 1 tablet (0.6 mg total) by mouth 2 (two) times daily as needed. For gout flares What changed:  when to take this  additional instructions   DULoxetine 30 MG capsule Commonly known as:  CYMBALTA Take 1 capsule (30 mg total) by mouth daily.   HYDROcodone-acetaminophen 5-325 MG tablet Commonly known as:  NORCO/VICODIN Take 1 tablet by mouth 2 (two) times daily as needed for moderate pain.   hydrocortisone 2.5 % cream APPLY TOPICALLY 3 (THREE) TIMES DAILY AS NEEDED. What changed:  See the new instructions.   indomethacin 50 MG capsule Commonly known as:  INDOCIN TAKE 1 CAPSULE BY MOUTH TWICE A DAY WITH A MEAL   losartan 100 MG tablet Commonly known as:  COZAAR Take 1 tablet (100 mg total) by mouth daily.            Discharge Care Instructions        Start     Ordered   07/04/17 0000  allopurinol (ZYLOPRIM) 100 MG tablet  3 times daily     07/04/17 1825   07/04/17 0000  ALPRAZolam (XANAX) 1 MG tablet  Every 8 hours PRN     07/04/17 1825   07/04/17 0000  Increase activity slowly     07/04/17 1825    07/04/17 0000  Diet - low sodium heart healthy     07/04/17 1825       DISCHARGE INSTRUCTIONS:   DIET:  Regular diet DISCHARGE CONDITION:  Good ACTIVITY:  Activity as tolerated OXYGEN:  Home Oxygen: No.  Oxygen Delivery: room air DISCHARGE LOCATION:  [place]   If you experience worsening of your admission symptoms, develop shortness of breath, life threatening emergency, suicidal or homicidal thoughts you must seek medical attention immediately by calling 911 or calling your MD immediately  if symptoms less severe.  You Must read complete instructions/literature along with all the possible adverse reactions/side effects for all the Medicines you take and that have been prescribed to you. Take any new Medicines after you have completely understood and accpet all the possible adverse reactions/side effects.   Please note  You were cared for by a hospitalist during your hospital stay. If you have any questions about your discharge medications or the care you received while you were in the hospital after you are discharged, you can call the unit and asked to speak with the hospitalist on call if the hospitalist that took care of you is not available. Once you are discharged, your primary care physician will handle any further medical issues. Please note that NO REFILLS for any discharge medications will be authorized once you are discharged, as it is imperative that you return to your primary care physician (or establish a relationship with a primary care physician if you do not have one) for your aftercare needs so that they can reassess your need for medications and monitor your lab values.    On the day of Discharge:  Pt still has abdominal pain VITAL SIGNS:  Blood pressure (!) 136/91, pulse 100, temperature 98.2 F (36.8 C), temperature source Oral, resp. rate 13, height _0  (1.88 m), weight 263 lb 12.8 oz (119.7 kg), SpO2 92 %. PHYSICAL EXAMINATION:  GENERAL:  36 y.o.-year-old patient lying in the bed with no acute distress.  EYES: Pupils equal, round, reactive to light and accommodation. No scleral icterus. Extraocular muscles intact.  HEENT: Head atraumatic, normocephalic. Oropharynx and nasopharynx clear.  NECK:  Supple, no jugular venous distention. No thyroid enlargement, no tenderness.  LUNGS: Normal breath sounds bilaterally, no wheezing, rales,rhonchi or crepitation. No use of accessory muscles of respiration.  CARDIOVASCULAR: S1, S2 normal. No murmurs, rubs, or gallops.  ABDOMEN: Soft, non-tender, non-distended. Bowel sounds present. No organomegaly or mass.  EXTREMITIES: No pedal edema, cyanosis, or clubbing.  NEUROLOGIC: Cranial nerves II through XII are intact. Muscle strength 5/5 in all extremities. Sensation intact. Gait not checked.  PSYCHIATRIC: The patient is alert and oriented x 3.  SKIN: No obvious rash, lesion, or ulcer.  DATA REVIEW:   CBC  Recent Labs Lab 07/05/17 0421  WBC 36.8*  HGB 11.1*  HCT 31.8*  PLT 60*    Chemistries   Recent Labs Lab 07/05/17 0421  NA  141  K 3.5  CL 104  CO2 27  GLUCOSE 95  BUN 13  CREATININE 0.94  CALCIUM 8.2*  AST 208*  ALT 50  ALKPHOS 74  BILITOT 1.5*     Microbiology Results  Results for orders placed or performed during the hospital encounter of 05/05/17  Culture, blood (Routine X 2) w Reflex to ID Panel     Status: None   Collection Time: 05/05/17  4:00 PM  Result Value Ref Range Status   Specimen Description BLOOD RIGHT ANTECUBITAL  Final   Special Requests   Final    BOTTLES DRAWN AEROBIC AND ANAEROBIC Blood Culture adequate volume   Culture NO GROWTH 5 DAYS  Final   Report Status 05/10/2017 FINAL  Final  Culture, blood (Routine X 2) w Reflex to ID Panel     Status: None   Collection Time: 05/05/17  4:10 PM  Result Value Ref Range Status   Specimen Description BLOOD RIGHT ARM  Final   Special Requests   Final    BOTTLES DRAWN AEROBIC ONLY Blood Culture adequate  volume   Culture NO GROWTH 5 DAYS  Final   Report Status 05/10/2017 FINAL  Final  Gram stain     Status: None   Collection Time: 05/05/17  8:27 PM  Result Value Ref Range Status   Specimen Description FLUID SYNOVIAL RIGHT KNEE  Final   Special Requests Normal  Final   Gram Stain   Final    MODERATE WBC PRESENT, PREDOMINANTLY PMN NO ORGANISMS SEEN    Report Status 05/05/2017 FINAL  Final  Culture, body fluid-bottle     Status: None   Collection Time: 05/05/17  8:27 PM  Result Value Ref Range Status   Specimen Description FLUID SYNOVIAL RIGHT KNEE  Final   Special Requests   Final    BOTTLES DRAWN AEROBIC AND ANAEROBIC Blood Culture adequate volume   Culture NO GROWTH 5 DAYS  Final   Report Status 05/10/2017 FINAL  Final  Stat Gram stain     Status: None   Collection Time: 05/07/17  3:03 PM  Result Value Ref Range Status   Specimen Description FLUID SYNOVIAL RIGHT KNEE  Final   Special Requests NONE  Final   Gram Stain   Final    ABUNDANT WBC PRESENT, PREDOMINANTLY PMN NO ORGANISMS SEEN    Report Status 05/07/2017 FINAL  Final    RADIOLOGY:  Nm Pet Image Initial (pi) Skull Base To Thigh  Result Date: 07/04/2017 CLINICAL DATA:  Initial treatment strategy for splenomegaly and thoracoabdominal lymphadenopathy. EXAM: NUCLEAR MEDICINE PET SKULL BASE TO THIGH TECHNIQUE: 12.8 mCi F-18 FDG was injected intravenously. Full-ring PET imaging was performed from the skull base to thigh after the radiotracer. CT data was obtained and used for attenuation correction and anatomic localization. FASTING BLOOD GLUCOSE:  Value: 79 mg/dl COMPARISON:  07/03/2017 chest CT.  07/02/2017 CT abdomen/ pelvis. FINDINGS: NECK: There are mildly hypermetabolic mildly enlarged lymph nodes in the bilateral neck at levels 3 and 4. For example, a 0.8 cm left level 3 lymph node with max SUV 3.5 (series 3/image 61) and a 1.2 cm left level 4 neck lymph node with max SUV 3.6 (series 3/image 73). CHEST: Mildly enlarged and  mildly hypermetabolic bilateral paratracheal lymph nodes, for example 1.0 cm left paratracheal node with max SUV 3.1 (series 3/image 92). Enlarged hypermetabolic 1.2 cm subcarinal node with max SUV 5.1 (series 3/image 108). Mildly enlarged hypermetabolic 1.0 cm AP window node with max SUV  3.0 (series 3/image 92). Enlarged hypermetabolic 1.4 cm right pericardiophrenic node with max SUV 3.8 (series 3/image 125). No hypermetabolic axillary or hilar lymph nodes. Coarsely calcified left infrahilar nodes from prior granulomatous disease. subcentimeter calcified left lower lobe granuloma. Subsegmental lingular and bilateral lower lobe atelectasis. No acute consolidative airspace disease, lung masses or significant pulmonary nodules. Left anterior descending coronary atherosclerosis. No pneumothorax. No pleural effusions. ABDOMEN/PELVIS: Moderately enlarged (craniocaudal splenic length 20.5 cm) uniformly hypermetabolic spleen with max SUV 8.8 (compared to liver max SUV 6.1). No splenic masses. Bulky hypermetabolic portacaval, porta hepatis, para celiac, aortocaval and left para-aortic lymphadenopathy. Representative nodes as follows: - portacaval 3.8 cm node with max SUV 8.1 (series 3/image 171) - porta hepatis 2.3 cm node with max SUV 7.4 (series 3/image 157) - para celiac 1.5 cm node with max SUV 6.3 (series 3/image 162) - aortocaval 1.2 cm node with max SUV 5.1 (series 3/image 173) - left para- aortic 1.2 cm node with max SUV 3.9 (series 3/image 185) No abnormal hypermetabolic activity within the liver, pancreas or adrenal glands. No enlarged or hypermetabolic pelvic lymph nodes. Subacute hemoperitoneum in the deep pelvis is decreased in density and stable in volume. Atherosclerotic nonaneurysmal abdominal aorta. SKELETON: There is diffuse marrow hypermetabolism throughout the axial and proximal appendicular skeleton, with representative max SUV 7.5 in the L1 vertebral body. IMPRESSION: 1. Hypermetabolic bilateral neck,  mediastinal and extensive retroperitoneal adenopathy. Moderately enlarged hypermetabolic spleen. Diffuse marrow hypermetabolism in the axial and proximal appendicular skeleton. 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 2. Mild bibasilar atelectasis. 3. Expected evolution of small volume subacute hemoperitoneum in the deep pelvis. 4. One vessel coronary atherosclerosis. 5.  Aortic Atherosclerosis (ICD10-I70.0). Electronically Signed   By: Ilona Sorrel M.D.   On: 07/04/2017 12:04     Management plans discussed with the patient, family, Dr Janese Banks and they are in agreement.  CODE STATUS: Full Code   TOTAL TIME TAKING CARE OF THIS PATIENT: 45 minutes.    Dustin Flock M.D on 07/05/2017 at 9:35 AM  Between 7am to 6pm - Pager - [phone]  After 6pm go to [email or web address] - Proofreader  [facility] [place] [facility]  Office  [phone]  CC: Primary care physician; Venia Carbon, MD   Note: This dictation was prepared with Dragon dictation along with smaller phrase technology. Any transcriptional errors that result from this process are unintentional.

## 2017-07-05 NOTE — Progress Notes (Signed)
Patient transferred to [facility] hospital. VSS. Report given to nurse on 4 oncology hall. [facility] to transport.

## 2017-07-05 NOTE — Progress Notes (Signed)
Patient prepared to transfer to [facility].  [facility] called, bed available, room 4852 4-Oncology hall,   report given to [place], Therapist, sports.  Awaiting on [facility] for transport.

## 2017-07-06 DIAGNOSIS — R591 Generalized enlarged lymph nodes: Secondary | ICD-10-CM

## 2017-07-06 DIAGNOSIS — D72829 Elevated white blood cell count, unspecified: Secondary | ICD-10-CM

## 2017-07-06 DIAGNOSIS — R590 Localized enlarged lymph nodes: Secondary | ICD-10-CM

## 2017-07-10 ENCOUNTER — Telehealth: Payer: Self-pay | Admitting: Internal Medicine

## 2017-07-10 NOTE — Telephone Encounter (Signed)
Yes---he has new lymphoma and is at [facility] now

## 2017-07-10 NOTE — Telephone Encounter (Signed)
Called the patient to check if he had called Dr Varney Biles office to schedule his appointment. He said he was recently diagnosed with Blood Cancer and to nevermind the Psychiatry Referral and then hung the phone. I will cancel the Referral in Epic.

## 2017-07-29 ENCOUNTER — Encounter (HOSPITAL_COMMUNITY): Payer: Self-pay

## 2017-07-29 LAB — TISSUE HYBRIDIZATION TO NCBH

## 2017-07-29 LAB — CHROMOSOME ANALYSIS, BONE MARROW

## 2017-08-07 ENCOUNTER — Telehealth: Payer: Self-pay | Admitting: Internal Medicine

## 2017-08-07 ENCOUNTER — Encounter: Payer: Self-pay | Admitting: Internal Medicine

## 2017-08-07 MED ORDER — LOSARTAN POTASSIUM 100 MG PO TABS: 100.0000 mg | ORAL_TABLET | Freq: Every day | ORAL | 3 refills | Status: DC

## 2017-08-07 MED ORDER — HYDROCODONE-ACETAMINOPHEN 5-325 MG PO TABS: 1.0000 | ORAL_TABLET | Freq: Two times a day (BID) | ORAL | 0 refills | Status: DC | PRN

## 2017-08-07 NOTE — Telephone Encounter (Signed)
Please let him know I got a notice about his second round. Let him know I am thinking about him and he should call or send email if he has any questions

## 2017-08-07 NOTE — Telephone Encounter (Signed)
See previous message  Already done

## 2017-08-24 ENCOUNTER — Other Ambulatory Visit: Payer: Self-pay | Admitting: Internal Medicine

## 2017-08-28 MED ORDER — FAMOTIDINE 20 MG/2ML IV SOLN
20.00 mg | INTRAVENOUS | Status: DC
Start: ? — End: 2017-08-28

## 2017-08-28 MED ORDER — CLONAZEPAM 0.5 MG PO TABS
.50 mg | ORAL_TABLET | ORAL | Status: DC
Start: ? — End: 2017-08-28

## 2017-08-28 MED ORDER — GENERIC EXTERNAL MEDICATION
0.50 | Status: DC
Start: ? — End: 2017-08-28

## 2017-08-28 MED ORDER — PROCHLORPERAZINE EDISYLATE 5 MG/ML IJ SOLN
10.00 mg | INTRAMUSCULAR | Status: DC
Start: ? — End: 2017-08-28

## 2017-08-28 MED ORDER — SODIUM CHLORIDE 0.9 % IJ SOLN
10.00 | INTRAMUSCULAR | Status: DC
Start: 2017-08-28 — End: 2017-08-28

## 2017-08-28 MED ORDER — ACETAMINOPHEN 325 MG PO TABS
650.00 mg | ORAL_TABLET | ORAL | Status: DC
Start: ? — End: 2017-08-28

## 2017-08-28 MED ORDER — EPINEPHRINE 0.3 MG/0.3ML IJ SOAJ
0.30 mg | INTRAMUSCULAR | Status: DC
Start: ? — End: 2017-08-28

## 2017-08-28 MED ORDER — OXYCODONE HCL 5 MG PO TABS
5.00 mg | ORAL_TABLET | ORAL | Status: DC
Start: ? — End: 2017-08-28

## 2017-08-28 MED ORDER — SODIUM CHLORIDE 0.9 % IV SOLN
20.00 | INTRAVENOUS | Status: DC
Start: ? — End: 2017-08-28

## 2017-08-28 MED ORDER — GENERIC EXTERNAL MEDICATION
Status: DC
Start: ? — End: 2017-08-28

## 2017-08-28 MED ORDER — VALACYCLOVIR HCL 500 MG PO TABS
500.00 mg | ORAL_TABLET | ORAL | Status: DC
Start: 2017-08-29 — End: 2017-08-28

## 2017-08-28 MED ORDER — GENERIC EXTERNAL MEDICATION
1.00 | Status: DC
Start: ? — End: 2017-08-28

## 2017-08-28 MED ORDER — MEPERIDINE HCL 25 MG/ML IJ SOLN
25.00 mg | INTRAMUSCULAR | Status: DC
Start: ? — End: 2017-08-28

## 2017-08-28 MED ORDER — SODIUM CHLORIDE 0.9 % IV SOLN
1000.00 | INTRAVENOUS | Status: DC
Start: ? — End: 2017-08-28

## 2017-08-28 MED ORDER — METHYLPREDNISOLONE SODIUM SUCC 125 MG IJ SOLR
125.00 mg | INTRAMUSCULAR | Status: DC
Start: ? — End: 2017-08-28

## 2017-08-28 MED ORDER — LOSARTAN POTASSIUM 100 MG PO TABS
100.00 mg | ORAL_TABLET | ORAL | Status: DC
Start: 2017-08-29 — End: 2017-08-28

## 2017-08-28 MED ORDER — DIPHENHYDRAMINE HCL 50 MG/ML IJ SOLN
25.00 mg | INTRAMUSCULAR | Status: DC
Start: ? — End: 2017-08-28

## 2017-08-28 MED ORDER — LOPERAMIDE HCL 2 MG PO CAPS
2.00 mg | ORAL_CAPSULE | ORAL | Status: DC
Start: ? — End: 2017-08-28

## 2017-08-28 MED ORDER — LOPERAMIDE HCL 2 MG PO CAPS
4.00 mg | ORAL_CAPSULE | ORAL | Status: DC
Start: ? — End: 2017-08-28

## 2017-08-28 MED ORDER — PROCHLORPERAZINE MALEATE 10 MG PO TABS
10.00 mg | ORAL_TABLET | ORAL | Status: DC
Start: ? — End: 2017-08-28

## 2017-08-28 MED ORDER — DIPHENHYDRAMINE HCL 50 MG/ML IJ SOLN
50.00 mg | INTRAMUSCULAR | Status: DC
Start: ? — End: 2017-08-28

## 2017-08-28 MED ORDER — DAPSONE 100 MG PO TABS
100.00 mg | ORAL_TABLET | ORAL | Status: DC
Start: 2017-08-29 — End: 2017-08-28

## 2017-08-28 MED ORDER — POLYETHYLENE GLYCOL 3350 17 G PO PACK
17.00 | PACK | ORAL | Status: DC
Start: ? — End: 2017-08-28

## 2017-08-28 MED ORDER — DULOXETINE HCL 20 MG PO CPEP
40.00 mg | ORAL_CAPSULE | ORAL | Status: DC
Start: 2017-08-29 — End: 2017-08-28

## 2017-09-02 ENCOUNTER — Other Ambulatory Visit: Payer: Self-pay | Admitting: Internal Medicine

## 2017-09-03 ENCOUNTER — Other Ambulatory Visit: Payer: Self-pay | Admitting: Internal Medicine

## 2017-09-03 MED ORDER — HYDROCODONE-ACETAMINOPHEN 5-325 MG PO TABS: 1.0000 | ORAL_TABLET | Freq: Two times a day (BID) | ORAL | 0 refills | Status: DC | PRN

## 2017-09-03 NOTE — Telephone Encounter (Signed)
Noted. Checked PMP aware site, no suspicious activity. Rx printed and placed in Shannon's inbox.

## 2017-09-03 NOTE — Telephone Encounter (Signed)
Last printed 08-07-17 #60 Last OV 07-01-17 No Future OV Last CSA/UDS 01-14-17  Asking for refill due to holiday. Will you do in Dr Alla German absence?

## 2017-09-04 MED ORDER — HYDROCODONE-ACETAMINOPHEN 5-325 MG PO TABS: 1.0000 | ORAL_TABLET | Freq: Two times a day (BID) | ORAL | 0 refills | Status: DC | PRN

## 2017-09-04 NOTE — Telephone Encounter (Signed)
Lm on pts vm and advised Rx is available for pickup from the front desk

## 2017-09-18 MED ORDER — SODIUM CHLORIDE 0.9 % IJ SOLN
10.00 | INTRAMUSCULAR | Status: DC
Start: 2017-09-18 — End: 2017-09-18

## 2017-09-18 MED ORDER — MEPERIDINE HCL 25 MG/ML IJ SOLN
25.00 mg | INTRAMUSCULAR | Status: DC
Start: ? — End: 2017-09-18

## 2017-09-18 MED ORDER — LOSARTAN POTASSIUM 100 MG PO TABS
100.00 mg | ORAL_TABLET | ORAL | Status: DC
Start: 2017-09-19 — End: 2017-09-18

## 2017-09-18 MED ORDER — DAPSONE 100 MG PO TABS
100.00 mg | ORAL_TABLET | ORAL | Status: DC
Start: 2017-09-19 — End: 2017-09-18

## 2017-09-18 MED ORDER — VALACYCLOVIR HCL 500 MG PO TABS
500.00 mg | ORAL_TABLET | ORAL | Status: DC
Start: 2017-09-19 — End: 2017-09-18

## 2017-09-18 MED ORDER — SODIUM CHLORIDE 0.9 % IV SOLN
1000.00 | INTRAVENOUS | Status: DC
Start: ? — End: 2017-09-18

## 2017-09-18 MED ORDER — DOCUSATE SODIUM 100 MG PO CAPS
200.00 mg | ORAL_CAPSULE | ORAL | Status: DC
Start: 2017-09-18 — End: 2017-09-18

## 2017-09-18 MED ORDER — ACETAMINOPHEN 325 MG PO TABS
650.00 mg | ORAL_TABLET | ORAL | Status: DC
Start: ? — End: 2017-09-18

## 2017-09-18 MED ORDER — GENERIC EXTERNAL MEDICATION
1.00 | Status: DC
Start: ? — End: 2017-09-18

## 2017-09-18 MED ORDER — FAMOTIDINE 20 MG/2ML IV SOLN
20.00 mg | INTRAVENOUS | Status: DC
Start: ? — End: 2017-09-18

## 2017-09-18 MED ORDER — PREDNISOLONE ACETATE 1 % OP SUSP
2.00 | OPHTHALMIC | Status: DC
Start: 2017-09-18 — End: 2017-09-18

## 2017-09-18 MED ORDER — EPINEPHRINE 0.3 MG/0.3ML IJ SOAJ
0.30 mg | INTRAMUSCULAR | Status: DC
Start: ? — End: 2017-09-18

## 2017-09-18 MED ORDER — SODIUM CHLORIDE 0.9 % IV SOLN
20.00 | INTRAVENOUS | Status: DC
Start: ? — End: 2017-09-18

## 2017-09-18 MED ORDER — LOPERAMIDE HCL 2 MG PO CAPS
2.00 mg | ORAL_CAPSULE | ORAL | Status: DC
Start: ? — End: 2017-09-18

## 2017-09-18 MED ORDER — PROCHLORPERAZINE EDISYLATE 5 MG/ML IJ SOLN
10.00 mg | INTRAMUSCULAR | Status: DC
Start: ? — End: 2017-09-18

## 2017-09-18 MED ORDER — GENERIC EXTERNAL MEDICATION
90.00 mg | Status: DC
Start: 2017-09-19 — End: 2017-09-18

## 2017-09-18 MED ORDER — DRONABINOL 5 MG PO CAPS
5.00 mg | ORAL_CAPSULE | ORAL | Status: DC
Start: 2017-09-18 — End: 2017-09-18

## 2017-09-18 MED ORDER — DIPHENHYDRAMINE HCL 50 MG/ML IJ SOLN
50.00 mg | INTRAMUSCULAR | Status: DC
Start: ? — End: 2017-09-18

## 2017-09-18 MED ORDER — METHYLPREDNISOLONE SODIUM SUCC 125 MG IJ SOLR
125.00 mg | INTRAMUSCULAR | Status: DC
Start: ? — End: 2017-09-18

## 2017-09-18 MED ORDER — LORAZEPAM 0.5 MG PO TABS
.50 mg | ORAL_TABLET | ORAL | Status: DC
Start: ? — End: 2017-09-18

## 2017-09-18 MED ORDER — LOPERAMIDE HCL 2 MG PO CAPS
4.00 mg | ORAL_CAPSULE | ORAL | Status: DC
Start: ? — End: 2017-09-18

## 2017-09-18 MED ORDER — INFLUENZA VAC SPLIT QUAD 0.5 ML IM SUSY
.50 | PREFILLED_SYRINGE | INTRAMUSCULAR | Status: DC
Start: ? — End: 2017-09-18

## 2017-09-18 MED ORDER — GENERIC EXTERNAL MEDICATION
Status: DC
Start: ? — End: 2017-09-18

## 2017-09-18 MED ORDER — POLYETHYLENE GLYCOL 3350 17 G PO PACK
17.00 | PACK | ORAL | Status: DC
Start: ? — End: 2017-09-18

## 2017-09-18 MED ORDER — HYDROCODONE-ACETAMINOPHEN 5-325 MG PO TABS
1.00 | ORAL_TABLET | ORAL | Status: DC
Start: ? — End: 2017-09-18

## 2017-09-18 MED ORDER — PROCHLORPERAZINE MALEATE 5 MG PO TABS
10.00 mg | ORAL_TABLET | ORAL | Status: DC
Start: ? — End: 2017-09-18

## 2017-09-18 MED ORDER — DIPHENHYDRAMINE HCL 50 MG/ML IJ SOLN
25.00 mg | INTRAMUSCULAR | Status: DC
Start: ? — End: 2017-09-18

## 2017-10-03 ENCOUNTER — Other Ambulatory Visit: Payer: Self-pay | Admitting: Primary Care

## 2017-10-03 MED ORDER — HYDROCODONE-ACETAMINOPHEN 5-325 MG PO TABS: 1.0000 | ORAL_TABLET | Freq: Two times a day (BID) | ORAL | 0 refills | Status: DC | PRN

## 2017-10-03 NOTE — Telephone Encounter (Signed)
Rx up front

## 2017-10-03 NOTE — Telephone Encounter (Signed)
Last printed 09-04-17 #60 Last OV Acute 07-01-17 No Future OV Last CSA 01-14-17

## 2017-10-03 NOTE — Telephone Encounter (Signed)
Undergoing chemo for lymphoma but reviewed [facility] records and they note awareness of his being on this--so okay to refill. He will still need a follow up appt her per state law and our policy

## 2017-10-14 MED ORDER — GENERIC EXTERNAL MEDICATION
Status: DC
Start: ? — End: 2017-10-14

## 2017-10-14 MED ORDER — SODIUM CHLORIDE 0.9 % IV SOLN
20.00 | INTRAVENOUS | Status: DC
Start: ? — End: 2017-10-14

## 2017-10-14 MED ORDER — DRONABINOL 5 MG PO CAPS
5.00 mg | ORAL_CAPSULE | ORAL | Status: DC
Start: 2017-10-14 — End: 2017-10-14

## 2017-10-14 MED ORDER — LOPERAMIDE HCL 2 MG PO CAPS
2.00 mg | ORAL_CAPSULE | ORAL | Status: DC
Start: ? — End: 2017-10-14

## 2017-10-14 MED ORDER — ACETAMINOPHEN 325 MG PO TABS
650.00 mg | ORAL_TABLET | ORAL | Status: DC
Start: ? — End: 2017-10-14

## 2017-10-14 MED ORDER — POLYETHYLENE GLYCOL 3350 17 G PO PACK
17.00 | PACK | ORAL | Status: DC
Start: ? — End: 2017-10-14

## 2017-10-14 MED ORDER — SODIUM CHLORIDE 0.9 % IJ SOLN
10.00 | INTRAMUSCULAR | Status: DC
Start: 2017-10-14 — End: 2017-10-14

## 2017-10-14 MED ORDER — VALACYCLOVIR HCL 500 MG PO TABS
500.00 mg | ORAL_TABLET | ORAL | Status: DC
Start: 2017-10-15 — End: 2017-10-14

## 2017-10-14 MED ORDER — DAPSONE 100 MG PO TABS
100.00 mg | ORAL_TABLET | ORAL | Status: DC
Start: 2017-10-15 — End: 2017-10-14

## 2017-10-14 MED ORDER — DIPHENHYDRAMINE HCL 50 MG/ML IJ SOLN
50.00 mg | INTRAMUSCULAR | Status: DC
Start: ? — End: 2017-10-14

## 2017-10-14 MED ORDER — MEPERIDINE HCL 25 MG/ML IJ SOLN
25.00 mg | INTRAMUSCULAR | Status: DC
Start: ? — End: 2017-10-14

## 2017-10-14 MED ORDER — SODIUM CHLORIDE 0.9 % IV SOLN
100.00 | INTRAVENOUS | Status: DC
Start: ? — End: 2017-10-14

## 2017-10-14 MED ORDER — LOPERAMIDE HCL 2 MG PO CAPS
4.00 mg | ORAL_CAPSULE | ORAL | Status: DC
Start: ? — End: 2017-10-14

## 2017-10-14 MED ORDER — OXYCODONE HCL 5 MG PO TABS
5.00 mg | ORAL_TABLET | ORAL | Status: DC
Start: ? — End: 2017-10-14

## 2017-10-14 MED ORDER — PROCHLORPERAZINE MALEATE 10 MG PO TABS
10.00 mg | ORAL_TABLET | ORAL | Status: DC
Start: ? — End: 2017-10-14

## 2017-10-14 MED ORDER — SODIUM CHLORIDE 0.9 % IV SOLN
1000.00 | INTRAVENOUS | Status: DC
Start: ? — End: 2017-10-14

## 2017-10-14 MED ORDER — LORAZEPAM 0.5 MG PO TABS
0.50 mg | ORAL_TABLET | ORAL | Status: DC
Start: ? — End: 2017-10-14

## 2017-10-14 MED ORDER — PROCHLORPERAZINE EDISYLATE 5 MG/ML IJ SOLN
10.00 mg | INTRAMUSCULAR | Status: DC
Start: ? — End: 2017-10-14

## 2017-10-14 MED ORDER — EPINEPHRINE 0.3 MG/0.3ML IJ SOAJ
0.30 mg | INTRAMUSCULAR | Status: DC
Start: ? — End: 2017-10-14

## 2017-10-14 MED ORDER — GENERIC EXTERNAL MEDICATION
90.00 mg | Status: DC
Start: 2017-10-15 — End: 2017-10-14

## 2017-10-14 MED ORDER — DIPHENHYDRAMINE HCL 50 MG/ML IJ SOLN
25.00 mg | INTRAMUSCULAR | Status: DC
Start: ? — End: 2017-10-14

## 2017-10-14 MED ORDER — FAMOTIDINE 20 MG/2ML IV SOLN
20.00 mg | INTRAVENOUS | Status: DC
Start: ? — End: 2017-10-14

## 2017-10-14 MED ORDER — METHYLPREDNISOLONE SODIUM SUCC 125 MG IJ SOLR
125.00 mg | INTRAMUSCULAR | Status: DC
Start: ? — End: 2017-10-14

## 2017-10-14 MED ORDER — LOSARTAN POTASSIUM 100 MG PO TABS
100.00 mg | ORAL_TABLET | ORAL | Status: DC
Start: 2017-10-15 — End: 2017-10-14

## 2017-10-25 ENCOUNTER — Ambulatory Visit: Payer: BC Managed Care – PPO | Admitting: Internal Medicine

## 2017-10-25 ENCOUNTER — Encounter: Payer: Self-pay | Admitting: Internal Medicine

## 2017-10-25 VITALS — BP 132/84 | HR 120 | Temp 97.7°F

## 2017-10-25 DIAGNOSIS — C8330 Diffuse large B-cell lymphoma, unspecified site: Secondary | ICD-10-CM | POA: Diagnosis not present

## 2017-10-25 DIAGNOSIS — F411 Generalized anxiety disorder: Secondary | ICD-10-CM

## 2017-10-25 DIAGNOSIS — M549 Dorsalgia, unspecified: Secondary | ICD-10-CM

## 2017-10-25 DIAGNOSIS — G8929 Other chronic pain: Secondary | ICD-10-CM | POA: Diagnosis not present

## 2017-10-25 NOTE — Assessment & Plan Note (Signed)
Has had a hard time with chemo but starting his last round

## 2017-10-25 NOTE — Progress Notes (Addendum)
Subjective:   Patient ID: Mark Burns, male    DOB: [birth year], 37 y.o.   MRN: [id number]  HPI Here for follow up of narcotic dependence  Has had a hard time with his lymphoma diagnosis Ready to start his last chemo treatment Has been a very hard thing---constant nausea, nosebleeds, blood counts dropping Has gotten over the "feeling sorry for myself"  Out of work still--had accumulated time off Hopes to go back to work next month Has been told he is in remission---but will need repeat PET scans, etc  Did have "breakdown" with his anxiety and panic with the diagnosis and chemo starting On lorazepam from [facility] cymbalta increased--- taking 60mg  (and off citalopram) Uses the lorazepam at night if he can't sleep (and rarely in day)  Still has aching and ongoing pain Cannabis does help --but won't use it due to the legal issues Uses up to 2 hydrocodone daily  Current Outpatient Medications on File Prior to Visit  Medication Sig Dispense Refill  . dapsone 100 MG tablet Take 100 mg by mouth daily.  2  . dronabinol (MARINOL) 5 MG capsule Take 5 mg by mouth.    . DULoxetine (CYMBALTA) 30 MG capsule TAKE ONE CAPSULE BY MOUTH EVERY DAY (Patient taking differently: TAKE TWO CAPSULES BY MOUTH EVERY DAY) 30 capsule 5  . HYDROcodone-acetaminophen (NORCO/VICODIN) 5-325 MG tablet Take 1 tablet by mouth 2 (two) times daily as needed for moderate pain. 60 tablet 0  . indomethacin (INDOCIN) 50 MG capsule TAKE 1 CAPSULE BY MOUTH TWICE A DAY WITH A MEAL 30 capsule 0  . levofloxacin (LEVAQUIN) 500 MG tablet TAKE ONE 500MG  TABLET BY MOUTH DAILY STARTING DAY 8 OF EACH CYCLE CHEMO  1  . LORazepam (ATIVAN) 1 MG tablet Take 1 mg by mouth daily as needed. for anxiety  0  . losartan (COZAAR) 100 MG tablet Take 1 tablet (100 mg total) by mouth daily. 90 tablet 3  . polyethylene glycol (MIRALAX / GLYCOLAX) packet Take by mouth.    . sildenafil (REVATIO) 20 MG tablet TAKE 3 TO 5 TABLETS DAILY AS NEEDED  2   No  current facility-administered medications on file prior to visit.     Allergies  Allergen Reactions  . Sulfa Antibiotics Hives    Past Medical History:  Diagnosis Date  . ADHD, predominantly inattentive type   . Diffuse large B cell lymphoma ([place])   . Elevated LFTs 11/19/2013  . Erectile dysfunction   . Generalized anxiety disorder   . Gout   . Hypertension   . Recurrent cold sores   . Seborrheic dermatitis     Past Surgical History:  Procedure Laterality Date  . VASECTOMY  2014    History reviewed. No pertinent family history.  Social History   Socioeconomic History  . Marital status: Married    Spouse name: Not on file  . Number of children: 2  . Years of education: Not on file  . Highest education level: Not on file  Social Needs  . Financial resource strain: Not on file  . Food insecurity - worry: Not on file  . Food insecurity - inability: Not on file  . Transportation needs - medical: Not on file  . Transportation needs - non-medical: Not on file  Occupational History  . Occupation: PE teacher    CommentLicensed [employer]  Tobacco Use  . Smoking status: Never Smoker  . Smokeless tobacco: Never Used  Substance and Sexual Activity  . Alcohol use:  Yes    Comment: daily use   . Drug use: No  . Sexual activity: Not on file  Other Topics Concern  . Not on file  Social History Narrative   Played college football, married to "Judson Roch."    Review of Systems Has been eating Lost considerable weight--didn't want it checked today Uses the marinol if nauseated and it helps him eat A beer will also help the nausea Has some faith--but no formal religious beliefs  Objective:   Physical Exam  Constitutional: No distress.  Psychiatric: He has a normal mood and affect. His behavior is normal.          Assessment & Plan:

## 2017-10-25 NOTE — Assessment & Plan Note (Signed)
Will continue the up to 2 hydrocodone daily CSRS reviewed---multiple providers due to his cancer

## 2017-10-25 NOTE — Assessment & Plan Note (Signed)
Using the lorazepam mostly for sleep (and occasionally for panic)

## 2017-11-02 MED ORDER — DIPHENHYDRAMINE HCL 50 MG/ML IJ SOLN
25.00 mg | INTRAMUSCULAR | Status: DC
Start: ? — End: 2017-11-02

## 2017-11-02 MED ORDER — SODIUM CHLORIDE 0.9 % IJ SOLN
10.00 | INTRAMUSCULAR | Status: DC
Start: 2017-11-02 — End: 2017-11-02

## 2017-11-02 MED ORDER — GENERIC EXTERNAL MEDICATION
Status: DC
Start: ? — End: 2017-11-02

## 2017-11-02 MED ORDER — DULOXETINE HCL 60 MG PO CPEP
60.00 mg | ORAL_CAPSULE | ORAL | Status: DC
Start: 2017-11-03 — End: 2017-11-02

## 2017-11-02 MED ORDER — SODIUM CHLORIDE 0.9 % IV SOLN
1000.00 | INTRAVENOUS | Status: DC
Start: ? — End: 2017-11-02

## 2017-11-02 MED ORDER — POLYETHYLENE GLYCOL 3350 17 G PO PACK
17.00 | PACK | ORAL | Status: DC
Start: 2017-11-03 — End: 2017-11-02

## 2017-11-02 MED ORDER — PROCHLORPERAZINE EDISYLATE 5 MG/ML IJ SOLN
10.00 mg | INTRAMUSCULAR | Status: DC
Start: ? — End: 2017-11-02

## 2017-11-02 MED ORDER — DIPHENHYDRAMINE HCL 50 MG/ML IJ SOLN
50.00 mg | INTRAMUSCULAR | Status: DC
Start: ? — End: 2017-11-02

## 2017-11-02 MED ORDER — MEPERIDINE HCL 25 MG/ML IJ SOLN
25.00 mg | INTRAMUSCULAR | Status: DC
Start: ? — End: 2017-11-02

## 2017-11-02 MED ORDER — EPINEPHRINE 0.3 MG/0.3ML IJ SOAJ
.30 mg | INTRAMUSCULAR | Status: DC
Start: ? — End: 2017-11-02

## 2017-11-02 MED ORDER — VALACYCLOVIR HCL 500 MG PO TABS
500.00 mg | ORAL_TABLET | ORAL | Status: DC
Start: 2017-11-03 — End: 2017-11-02

## 2017-11-02 MED ORDER — DRONABINOL 5 MG PO CAPS
5.00 mg | ORAL_CAPSULE | ORAL | Status: DC
Start: 2017-11-02 — End: 2017-11-02

## 2017-11-02 MED ORDER — PREDNISOLONE ACETATE 1 % OP SUSP
2.00 | OPHTHALMIC | Status: DC
Start: 2017-11-02 — End: 2017-11-02

## 2017-11-02 MED ORDER — LEUCOVORIN CALCIUM 25 MG PO TABS
25.00 mg | ORAL_TABLET | ORAL | Status: DC
Start: 2017-11-02 — End: 2017-11-02

## 2017-11-02 MED ORDER — LOSARTAN POTASSIUM 100 MG PO TABS
100.00 mg | ORAL_TABLET | ORAL | Status: DC
Start: 2017-11-03 — End: 2017-11-02

## 2017-11-02 MED ORDER — PROCHLORPERAZINE MALEATE 10 MG PO TABS
10.00 mg | ORAL_TABLET | ORAL | Status: DC
Start: ? — End: 2017-11-02

## 2017-11-02 MED ORDER — FAMOTIDINE 20 MG/2ML IV SOLN
20.00 mg | INTRAVENOUS | Status: DC
Start: ? — End: 2017-11-02

## 2017-11-02 MED ORDER — ACETAMINOPHEN 325 MG PO TABS
650.00 mg | ORAL_TABLET | ORAL | Status: DC
Start: ? — End: 2017-11-02

## 2017-11-02 MED ORDER — SODIUM CHLORIDE 0.9 % IV SOLN
20.00 | INTRAVENOUS | Status: DC
Start: ? — End: 2017-11-02

## 2017-11-02 MED ORDER — GENERIC EXTERNAL MEDICATION
150.00 | Status: DC
Start: ? — End: 2017-11-02

## 2017-11-02 MED ORDER — LOPERAMIDE HCL 2 MG PO CAPS
4.00 mg | ORAL_CAPSULE | ORAL | Status: DC
Start: ? — End: 2017-11-02

## 2017-11-02 MED ORDER — DAPSONE 100 MG PO TABS
100.00 mg | ORAL_TABLET | ORAL | Status: DC
Start: 2017-11-03 — End: 2017-11-02

## 2017-11-02 MED ORDER — LORAZEPAM 1 MG PO TABS
1.00 mg | ORAL_TABLET | ORAL | Status: DC
Start: 2017-11-02 — End: 2017-11-02

## 2017-11-02 MED ORDER — LOPERAMIDE HCL 2 MG PO CAPS
2.00 mg | ORAL_CAPSULE | ORAL | Status: DC
Start: ? — End: 2017-11-02

## 2017-11-02 MED ORDER — METHYLPREDNISOLONE SODIUM SUCC 125 MG IJ SOLR
125.00 mg | INTRAMUSCULAR | Status: DC
Start: ? — End: 2017-11-02

## 2017-11-04 ENCOUNTER — Other Ambulatory Visit: Payer: Self-pay | Admitting: Internal Medicine

## 2017-11-04 MED ORDER — HYDROCODONE-ACETAMINOPHEN 5-325 MG PO TABS: 1.0000 | ORAL_TABLET | Freq: Two times a day (BID) | ORAL | 0 refills | Status: DC | PRN

## 2017-11-04 NOTE — Telephone Encounter (Signed)
Last filled 10-03-17 #60 Last OV 10-25-17 Next OV 01-23-18  Last UDS/CSA 01-14-17

## 2017-11-06 ENCOUNTER — Telehealth: Payer: Self-pay

## 2017-11-06 NOTE — Telephone Encounter (Signed)
CoverMyMeds form filled out online. Statement reads: [employer] is processing your PA request and will respond shortly with next steps.  You are currently using the fastest method to process this prior authorization. Please do not fax or call [employer] to resubmit this request.  To check for an update later, open this request again from your dashboard.

## 2017-11-13 MED ORDER — MAGNESIUM SULFATE 2 GM/50ML IV SOLN
2.00 | INTRAVENOUS | Status: DC
Start: ? — End: 2017-11-13

## 2017-11-13 MED ORDER — LOPERAMIDE HCL 2 MG PO CAPS
2.00 mg | ORAL_CAPSULE | ORAL | Status: DC
Start: ? — End: 2017-11-13

## 2017-11-13 MED ORDER — LORAZEPAM 1 MG PO TABS
1.00 mg | ORAL_TABLET | ORAL | Status: DC
Start: 2017-11-13 — End: 2017-11-13

## 2017-11-13 MED ORDER — VALACYCLOVIR HCL 500 MG PO TABS
500.00 mg | ORAL_TABLET | ORAL | Status: DC
Start: 2017-11-14 — End: 2017-11-13

## 2017-11-13 MED ORDER — SODIUM CHLORIDE 0.9 % IV SOLN
20.00 | INTRAVENOUS | Status: DC
Start: ? — End: 2017-11-13

## 2017-11-13 MED ORDER — SODIUM CHLORIDE 0.9 % IJ SOLN
10.00 | INTRAMUSCULAR | Status: DC
Start: 2017-11-13 — End: 2017-11-13

## 2017-11-13 MED ORDER — POTASSIUM CHLORIDE CRYS ER 10 MEQ PO TBCR
60.00 | EXTENDED_RELEASE_TABLET | ORAL | Status: DC
Start: ? — End: 2017-11-13

## 2017-11-13 MED ORDER — DAPSONE 100 MG PO TABS
100.00 mg | ORAL_TABLET | ORAL | Status: DC
Start: 2017-11-14 — End: 2017-11-13

## 2017-11-13 MED ORDER — DULOXETINE HCL 60 MG PO CPEP
60.00 mg | ORAL_CAPSULE | ORAL | Status: DC
Start: 2017-11-14 — End: 2017-11-13

## 2017-11-13 MED ORDER — FLUCONAZOLE 200 MG PO TABS
200.00 mg | ORAL_TABLET | ORAL | Status: DC
Start: 2017-11-14 — End: 2017-11-13

## 2017-11-13 MED ORDER — SODIUM CHLORIDE 0.9 % IV SOLN
INTRAVENOUS | Status: DC
Start: ? — End: 2017-11-13

## 2017-11-13 MED ORDER — GENERIC EXTERNAL MEDICATION
Status: DC
Start: ? — End: 2017-11-13

## 2017-11-13 MED ORDER — DRONABINOL 5 MG PO CAPS
5.00 mg | ORAL_CAPSULE | ORAL | Status: DC
Start: 2017-11-13 — End: 2017-11-13

## 2017-11-13 MED ORDER — LOSARTAN POTASSIUM 100 MG PO TABS
100.00 mg | ORAL_TABLET | ORAL | Status: DC
Start: 2017-11-14 — End: 2017-11-13

## 2017-11-13 MED ORDER — OXYMETAZOLINE HCL 0.05 % NA SOLN
3.00 | NASAL | Status: DC
Start: ? — End: 2017-11-13

## 2017-11-13 MED ORDER — CEFEPIME HCL 2 GM/100ML IV SOLN
2.00 | INTRAVENOUS | Status: DC
Start: 2017-11-13 — End: 2017-11-13

## 2017-11-13 MED ORDER — LOPERAMIDE HCL 2 MG PO CAPS
4.00 mg | ORAL_CAPSULE | ORAL | Status: DC
Start: ? — End: 2017-11-13

## 2017-11-13 MED ORDER — POTASSIUM CHLORIDE CRYS ER 10 MEQ PO TBCR
40.00 | EXTENDED_RELEASE_TABLET | ORAL | Status: DC
Start: ? — End: 2017-11-13

## 2017-11-15 ENCOUNTER — Ambulatory Visit: Payer: Self-pay | Admitting: Internal Medicine

## 2017-11-15 ENCOUNTER — Telehealth: Payer: Self-pay

## 2017-11-15 NOTE — Telephone Encounter (Signed)
Copied from [place] [phone]. Topic: Appointment Scheduling - Scheduling Inquiry for Clinic >> Nov 13, 2017  9:15 AM Bea Graff, NT wrote: Reason for CRM: Pt will be discharged from [facility] tomorrow and needs a HFU with Dr. Silvio Pate within 10-14 days after discharge. Do not see anywhere I can make this appt on Dr. Silvio Pate schedule. Please call pt to schedule.  >> Nov 13, 2017 10:17 AM Helene Shoe, LPN wrote: I spoke with pt and scheduled a 70' hospital f/u appt on 11/15/17 at 12:15; pt is in hospital for fever. >> Nov 15, 2017  9:14 AM Scherrie Gerlach wrote: Pt made appt while in the hospital , but now the hospital scheduled him for blood infusion at this same time. Pt states he doesn't feel the need to reschedule appt. Nothing new to discuss

## 2017-11-15 NOTE — Telephone Encounter (Signed)
He was in the hospital for part of his cancer care and follows with his oncologist. I would not schedule with me unless he requests it specifically

## 2017-12-04 ENCOUNTER — Other Ambulatory Visit: Payer: Self-pay | Admitting: Internal Medicine

## 2017-12-04 MED ORDER — INDOMETHACIN 50 MG PO CAPS: 50.0000 mg | ORAL_CAPSULE | Freq: Two times a day (BID) | ORAL | 0 refills | Status: DC | PRN

## 2017-12-04 MED ORDER — HYDROCODONE-ACETAMINOPHEN 5-325 MG PO TABS: 1.0000 | ORAL_TABLET | Freq: Two times a day (BID) | ORAL | 0 refills | Status: DC | PRN

## 2017-12-04 NOTE — Telephone Encounter (Signed)
Last written 11-04-17 #60 Last OV 10-25-17 Next OV 01-23-18

## 2017-12-05 ENCOUNTER — Other Ambulatory Visit: Payer: Self-pay | Admitting: Internal Medicine

## 2017-12-24 ENCOUNTER — Encounter: Payer: Self-pay | Admitting: Family Medicine

## 2017-12-24 ENCOUNTER — Encounter: Payer: Self-pay | Admitting: *Deleted

## 2017-12-24 ENCOUNTER — Ambulatory Visit (INDEPENDENT_AMBULATORY_CARE_PROVIDER_SITE_OTHER): Payer: BC Managed Care – PPO | Admitting: Family Medicine

## 2017-12-24 VITALS — BP 138/80 | HR 98 | Temp 99.2°F | Ht 73.75 in

## 2017-12-24 DIAGNOSIS — R509 Fever, unspecified: Secondary | ICD-10-CM | POA: Diagnosis not present

## 2017-12-24 DIAGNOSIS — C8330 Diffuse large B-cell lymphoma, unspecified site: Secondary | ICD-10-CM | POA: Diagnosis not present

## 2017-12-24 DIAGNOSIS — J029 Acute pharyngitis, unspecified: Secondary | ICD-10-CM

## 2017-12-24 LAB — CBC WITH DIFFERENTIAL/PLATELET
BASOS ABS: 0 10*3/uL (ref 0.0–0.1)
Basophils Relative: 0.4 % (ref 0.0–3.0)
Eosinophils Absolute: 0 10*3/uL (ref 0.0–0.7)
Eosinophils Relative: 0.1 % (ref 0.0–5.0)
HCT: 32.1 % — ABNORMAL LOW (ref 39.0–52.0)
Hemoglobin: 11.2 g/dL — ABNORMAL LOW (ref 13.0–17.0)
LYMPHS ABS: 0.4 10*3/uL — AB (ref 0.7–4.0)
Lymphocytes Relative: 5.6 % — ABNORMAL LOW (ref 12.0–46.0)
MCHC: 34.8 g/dL (ref 30.0–36.0)
MCV: 97.3 fl (ref 78.0–100.0)
MONOS PCT: 12.9 % — AB (ref 3.0–12.0)
Monocytes Absolute: 0.9 10*3/uL (ref 0.1–1.0)
NEUTROS ABS: 5.5 10*3/uL (ref 1.4–7.7)
NEUTROS PCT: 81 % — AB (ref 43.0–77.0)
Platelets: 96 10*3/uL — ABNORMAL LOW (ref 150.0–400.0)
RBC: 3.3 Mil/uL — ABNORMAL LOW (ref 4.22–5.81)
RDW: 17.2 % — ABNORMAL HIGH (ref 11.5–15.5)
WBC: 6.9 10*3/uL (ref 4.0–10.5)

## 2017-12-24 LAB — POC INFLUENZA A&B (BINAX/QUICKVUE)
INFLUENZA A, POC: NEGATIVE
Influenza B, POC: NEGATIVE

## 2017-12-24 LAB — POCT RAPID STREP A (OFFICE): Rapid Strep A Screen: NEGATIVE

## 2017-12-24 MED ORDER — GUAIFENESIN-CODEINE 100-10 MG/5ML PO SYRP
5.0000 mL | ORAL_SOLUTION | Freq: Every evening | ORAL | 0 refills | Status: DC | PRN
Start: 2017-12-24 — End: 2017-12-26

## 2017-12-24 NOTE — Patient Instructions (Signed)
Likely viral URI.  Rest, fluids.  Can use prescription cough suppressant at night as needed.  Please stop at the lab to have labs drawn.

## 2017-12-24 NOTE — Assessment & Plan Note (Signed)
Neg strep and neg flu test. Most consistent with viral URI. Symptomatic care.   Will re-eval cbc for wbc and diff given recent B cell lymphoma treatment.

## 2017-12-24 NOTE — Progress Notes (Signed)
   Subjective:    Patient ID: Mark Burns, male    DOB: Nov 19, 1980, 37 y.o.   MRN: [id number]  Cough  This is a new problem. The current episode started in the past 7 days (3 days ago). The problem has been rapidly worsening. The cough is productive of sputum. Associated symptoms include a fever, myalgias, nasal congestion, a sore throat and wheezing. Pertinent negatives include no ear congestion, ear pain or shortness of breath. Associated symptoms comments: 101.2 Fever . The symptoms are aggravated by lying down. Risk factors: nonsmoker. Treatments tried: tylenol. There is no history of asthma, COPD or environmental allergies. lympham on remission.Marland Kitchen last treatment in 10/2017    Blood pressure 138/80, pulse 98, temperature 99.2 F (37.3 C), temperature source Oral, height 6' 1.75" (1.873 m), SpO2 97 %.   Review of Systems  Constitutional: Positive for fever.  HENT: Positive for sore throat. Negative for ear pain.   Respiratory: Positive for cough and wheezing. Negative for shortness of breath.   Musculoskeletal: Positive for myalgias.  Allergic/Immunologic: Negative for environmental allergies.       Objective:   Physical Exam  Constitutional: Vital signs are normal. He appears well-developed and well-nourished.  Non-toxic appearance. He does not appear ill. No distress.  HENT:  Head: Normocephalic and atraumatic.  Right Ear: Hearing, tympanic membrane, external ear and ear canal normal. No tenderness. No foreign bodies. Tympanic membrane is not retracted and not bulging.  Left Ear: Hearing, tympanic membrane, external ear and ear canal normal. No tenderness. No foreign bodies. Tympanic membrane is not retracted and not bulging.  Nose: Rhinorrhea present. No mucosal edema. Right sinus exhibits no maxillary sinus tenderness and no frontal sinus tenderness. Left sinus exhibits no maxillary sinus tenderness and no frontal sinus tenderness.  Mouth/Throat: Uvula is midline and mucous membranes  are normal. Normal dentition. No dental caries. Posterior oropharyngeal erythema present. No oropharyngeal exudate, posterior oropharyngeal edema or tonsillar abscesses.  Eyes: Conjunctivae, EOM and lids are normal. Pupils are equal, round, and reactive to light. Lids are everted and swept, no foreign bodies found.  Neck: Trachea normal, normal range of motion and phonation normal. Neck supple. Carotid bruit is not present. No thyroid mass and no thyromegaly present.  Cardiovascular: Regular rhythm, S1 normal, S2 normal, normal heart sounds, intact distal pulses and normal pulses. Tachycardia present. Exam reveals no gallop.  No murmur heard. Pulmonary/Chest: Effort normal and breath sounds normal. No respiratory distress. He has no wheezes. He has no rhonchi. He has no rales.  Abdominal: Soft. Normal appearance and bowel sounds are normal. There is no hepatosplenomegaly. There is no tenderness. There is no rebound, no guarding and no CVA tenderness. No hernia.  Neurological: He is alert. He has normal reflexes.  Skin: Skin is warm, dry and intact. No rash noted.  Psychiatric: He has a normal mood and affect. His speech is normal and behavior is normal. Judgment normal.          Assessment & Plan:

## 2017-12-26 ENCOUNTER — Encounter: Payer: Self-pay | Admitting: *Deleted

## 2017-12-26 ENCOUNTER — Inpatient Hospital Stay
Admission: EM | Admit: 2017-12-26 | Discharge: 2017-12-29 | DRG: 871 | Disposition: A | Discharge status: Home / Self Care | Payer: BC Managed Care – PPO | Attending: Internal Medicine | Admitting: Internal Medicine

## 2017-12-26 ENCOUNTER — Emergency Department: Payer: BC Managed Care – PPO

## 2017-12-26 ENCOUNTER — Other Ambulatory Visit: Payer: Self-pay

## 2017-12-26 DIAGNOSIS — Z882 Allergy status to sulfonamides status: Secondary | ICD-10-CM

## 2017-12-26 DIAGNOSIS — Z79899 Other long term (current) drug therapy: Secondary | ICD-10-CM

## 2017-12-26 DIAGNOSIS — F419 Anxiety disorder, unspecified: Secondary | ICD-10-CM | POA: Diagnosis present

## 2017-12-26 DIAGNOSIS — J154 Pneumonia due to other streptococci: Secondary | ICD-10-CM | POA: Diagnosis present

## 2017-12-26 DIAGNOSIS — F329 Major depressive disorder, single episode, unspecified: Secondary | ICD-10-CM | POA: Diagnosis present

## 2017-12-26 DIAGNOSIS — R509 Fever, unspecified: Secondary | ICD-10-CM | POA: Diagnosis not present

## 2017-12-26 DIAGNOSIS — E876 Hypokalemia: Secondary | ICD-10-CM | POA: Diagnosis present

## 2017-12-26 DIAGNOSIS — I10 Essential (primary) hypertension: Secondary | ICD-10-CM | POA: Diagnosis present

## 2017-12-26 DIAGNOSIS — M109 Gout, unspecified: Secondary | ICD-10-CM | POA: Diagnosis present

## 2017-12-26 DIAGNOSIS — Z8249 Family history of ischemic heart disease and other diseases of the circulatory system: Secondary | ICD-10-CM | POA: Diagnosis not present

## 2017-12-26 DIAGNOSIS — E872 Acidosis: Secondary | ICD-10-CM | POA: Diagnosis present

## 2017-12-26 DIAGNOSIS — N529 Male erectile dysfunction, unspecified: Secondary | ICD-10-CM | POA: Diagnosis present

## 2017-12-26 DIAGNOSIS — Z9221 Personal history of antineoplastic chemotherapy: Secondary | ICD-10-CM | POA: Diagnosis not present

## 2017-12-26 DIAGNOSIS — E871 Hypo-osmolality and hyponatremia: Secondary | ICD-10-CM | POA: Diagnosis present

## 2017-12-26 DIAGNOSIS — R710 Precipitous drop in hematocrit: Secondary | ICD-10-CM | POA: Diagnosis not present

## 2017-12-26 DIAGNOSIS — Z8572 Personal history of non-Hodgkin lymphomas: Secondary | ICD-10-CM | POA: Diagnosis not present

## 2017-12-26 DIAGNOSIS — F900 Attention-deficit hyperactivity disorder, predominantly inattentive type: Secondary | ICD-10-CM | POA: Diagnosis present

## 2017-12-26 DIAGNOSIS — A419 Sepsis, unspecified organism: Secondary | ICD-10-CM | POA: Diagnosis not present

## 2017-12-26 DIAGNOSIS — E878 Other disorders of electrolyte and fluid balance, not elsewhere classified: Secondary | ICD-10-CM | POA: Diagnosis present

## 2017-12-26 LAB — CBC WITH DIFFERENTIAL/PLATELET
BLASTS: 0 %
Band Neutrophils: 3 %
Basophils Absolute: 0 10*3/uL (ref 0–0.1)
Basophils Relative: 0 %
Eosinophils Absolute: 0 10*3/uL (ref 0–0.7)
Eosinophils Relative: 0 %
HEMATOCRIT: 32.3 % — AB (ref 40.0–52.0)
HEMOGLOBIN: 11.4 g/dL — AB (ref 13.0–18.0)
LYMPHS PCT: 7 %
Lymphs Abs: 0.5 10*3/uL — ABNORMAL LOW (ref 1.0–3.6)
MCH: 33.5 pg (ref 26.0–34.0)
MCHC: 35.3 g/dL (ref 32.0–36.0)
MCV: 95 fL (ref 80.0–100.0)
MONOS PCT: 10 %
Metamyelocytes Relative: 2 %
Monocytes Absolute: 0.8 10*3/uL (ref 0.2–1.0)
Myelocytes: 1 %
NEUTROS ABS: 6.4 10*3/uL (ref 1.4–6.5)
NEUTROS PCT: 77 %
NRBC: 0 /100{WBCs}
OTHER: 0 %
PROMYELOCYTES ABS: 0 %
Platelets: 81 10*3/uL — ABNORMAL LOW (ref 150–440)
RBC: 3.4 MIL/uL — AB (ref 4.40–5.90)
RDW: 16.3 % — AB (ref 11.5–14.5)
WBC: 7.7 10*3/uL (ref 3.8–10.6)

## 2017-12-26 LAB — PROCALCITONIN: Procalcitonin: 0.21 ng/mL

## 2017-12-26 LAB — COMPREHENSIVE METABOLIC PANEL
ALT: 138 U/L — ABNORMAL HIGH (ref 17–63)
ANION GAP: 13 (ref 5–15)
AST: 117 U/L — AB (ref 15–41)
Albumin: 4.6 g/dL (ref 3.5–5.0)
Alkaline Phosphatase: 68 U/L (ref 38–126)
BUN: 14 mg/dL (ref 6–20)
CHLORIDE: 98 mmol/L — AB (ref 101–111)
CO2: 21 mmol/L — ABNORMAL LOW (ref 22–32)
Calcium: 9.3 mg/dL (ref 8.9–10.3)
Creatinine, Ser: 0.89 mg/dL (ref 0.61–1.24)
Glucose, Bld: 117 mg/dL — ABNORMAL HIGH (ref 65–99)
POTASSIUM: 3.4 mmol/L — AB (ref 3.5–5.1)
Sodium: 132 mmol/L — ABNORMAL LOW (ref 135–145)
Total Bilirubin: 1 mg/dL (ref 0.3–1.2)
Total Protein: 7.1 g/dL (ref 6.5–8.1)

## 2017-12-26 LAB — URINALYSIS, COMPLETE (UACMP) WITH MICROSCOPIC
Bacteria, UA: NONE SEEN
Bilirubin Urine: NEGATIVE
GLUCOSE, UA: NEGATIVE mg/dL
Ketones, ur: NEGATIVE mg/dL
Leukocytes, UA: NEGATIVE
Nitrite: NEGATIVE
PROTEIN: NEGATIVE mg/dL
Specific Gravity, Urine: 1.012 (ref 1.005–1.030)
Squamous Epithelial / LPF: NONE SEEN
WBC UA: NONE SEEN WBC/hpf (ref 0–5)
pH: 5 (ref 5.0–8.0)

## 2017-12-26 LAB — LACTIC ACID, PLASMA: LACTIC ACID, VENOUS: 2.7 mmol/L — AB (ref 0.5–1.9)

## 2017-12-26 LAB — TROPONIN I

## 2017-12-26 MED ORDER — LOSARTAN POTASSIUM 50 MG PO TABS
100.0000 mg | ORAL_TABLET | Freq: Every day | ORAL | Status: DC
  Administered 2017-12-27 – 2017-12-29 (×3): 100 mg via ORAL
  Filled 2017-12-26 (×3): qty 2

## 2017-12-26 MED ORDER — SODIUM CHLORIDE 0.9 % IV SOLN
500.0000 mg | INTRAVENOUS | Status: DC
  Administered 2017-12-27: 500 mg via INTRAVENOUS
  Filled 2017-12-26 (×2): qty 500

## 2017-12-26 MED ORDER — POLYETHYLENE GLYCOL 3350 17 G PO PACK
17.0000 g | PACK | Freq: Once | ORAL | Status: DC
  Filled 2017-12-26: qty 1

## 2017-12-26 MED ORDER — INDOMETHACIN 50 MG PO CAPS
50.0000 mg | ORAL_CAPSULE | Freq: Two times a day (BID) | ORAL | Status: DC
  Administered 2017-12-27: 50 mg via ORAL
  Filled 2017-12-26 (×2): qty 1

## 2017-12-26 MED ORDER — DULOXETINE HCL 30 MG PO CPEP
60.0000 mg | ORAL_CAPSULE | Freq: Every day | ORAL | Status: DC
  Administered 2017-12-27 – 2017-12-29 (×3): 60 mg via ORAL
  Filled 2017-12-26 (×3): qty 2

## 2017-12-26 MED ORDER — SODIUM CHLORIDE 0.9 % IV BOLUS (SEPSIS)
1000.0000 mL | Freq: Once | INTRAVENOUS | Status: AC
  Administered 2017-12-26: 1000 mL via INTRAVENOUS

## 2017-12-26 MED ORDER — HEPARIN SODIUM (PORCINE) 5000 UNIT/ML IJ SOLN
5000.0000 [IU] | Freq: Three times a day (TID) | INTRAMUSCULAR | Status: DC
  Administered 2017-12-27 – 2017-12-29 (×7): 5000 [IU] via SUBCUTANEOUS
  Filled 2017-12-26 (×7): qty 1

## 2017-12-26 MED ORDER — SODIUM CHLORIDE 0.9 % IV SOLN
INTRAVENOUS | Status: DC
  Administered 2017-12-26 – 2017-12-28 (×4): via INTRAVENOUS

## 2017-12-26 MED ORDER — DIAZEPAM 5 MG PO TABS
5.0000 mg | ORAL_TABLET | Freq: Once | ORAL | Status: AC
  Administered 2017-12-26: 5 mg via ORAL
  Filled 2017-12-26: qty 1

## 2017-12-26 MED ORDER — LORAZEPAM 1 MG PO TABS
1.0000 mg | ORAL_TABLET | Freq: Two times a day (BID) | ORAL | Status: DC | PRN
  Administered 2017-12-27 – 2017-12-28 (×2): 1 mg via ORAL
  Filled 2017-12-26 (×2): qty 1

## 2017-12-26 MED ORDER — SODIUM CHLORIDE 0.9 % IV SOLN
1.0000 g | INTRAVENOUS | Status: DC
  Administered 2017-12-27 – 2017-12-29 (×3): 1 g via INTRAVENOUS
  Filled 2017-12-26 (×3): qty 10

## 2017-12-26 MED ORDER — HYDROCODONE-ACETAMINOPHEN 5-325 MG PO TABS
1.0000 | ORAL_TABLET | Freq: Two times a day (BID) | ORAL | Status: DC | PRN
Start: 2017-12-26 — End: 2017-12-27
  Administered 2017-12-27: 1 via ORAL
  Filled 2017-12-26: qty 1

## 2017-12-26 MED ORDER — POTASSIUM CHLORIDE 20 MEQ PO PACK
40.0000 meq | PACK | Freq: Once | ORAL | Status: AC
  Administered 2017-12-26: 40 meq via ORAL
  Filled 2017-12-26: qty 2

## 2017-12-26 MED ORDER — SODIUM CHLORIDE 0.9 % IV BOLUS (SEPSIS)
500.0000 mL | Freq: Once | INTRAVENOUS | Status: AC
  Administered 2017-12-26: 500 mL via INTRAVENOUS

## 2017-12-26 MED ORDER — GUAIFENESIN-DM 100-10 MG/5ML PO SYRP
5.0000 mL | ORAL_SOLUTION | ORAL | Status: DC | PRN
  Administered 2017-12-27 – 2017-12-28 (×3): 5 mL via ORAL
  Filled 2017-12-26 (×3): qty 5

## 2017-12-26 MED ORDER — VANCOMYCIN HCL IN DEXTROSE 1-5 GM/200ML-% IV SOLN
1000.0000 mg | Freq: Once | INTRAVENOUS | Status: AC
  Administered 2017-12-26: 1000 mg via INTRAVENOUS
  Filled 2017-12-26: qty 200

## 2017-12-26 MED ORDER — CEFEPIME HCL 2 G IJ SOLR
2.0000 g | Freq: Once | INTRAMUSCULAR | Status: AC
  Administered 2017-12-26: 2 g via INTRAVENOUS
  Filled 2017-12-26: qty 2

## 2017-12-26 MED ORDER — FENTANYL CITRATE (PF) 100 MCG/2ML IJ SOLN
50.0000 ug | INTRAMUSCULAR | Status: DC | PRN
  Administered 2017-12-26 (×2): 50 ug via INTRAVENOUS
  Filled 2017-12-26 (×3): qty 2

## 2017-12-26 NOTE — H&P (Signed)
[facility] NAME: Mark Burns    MR#:  [id number]  DATE OF BIRTH:  July 28, 1981  DATE OF ADMISSION:  12/26/2017  PRIMARY CARE PHYSICIAN: Venia Carbon, MD   REQUESTING/REFERRING PHYSICIAN:   CHIEF COMPLAINT:   Chief Complaint  Patient presents with  . Fever    HISTORY OF PRESENT ILLNESS: Mark Burns  is a 37 y.o. male with a known history per below which also includes B-cell lymphoma status post chemo-completed course in January 2019, presents with 2-3-day history of worsening weakness, fatigue, fevers, chills, productive cough, back pain, in the emergency room found to be febrile, tachycardic, tachypneic, lactic acid level 2.7, chest x-ray noted for bilateral pneumonia, sepsis protocol initiated, patient evaluated bedside with significant other, patient now be admitted for acute sepsis secondary to bilateral pneumonia which is compounded by immunosuppression.  PAST MEDICAL HISTORY:   Past Medical History:  Diagnosis Date  . ADHD, predominantly inattentive type   . Diffuse large B cell lymphoma ([place])   . Elevated LFTs 11/19/2013  . Erectile dysfunction   . Generalized anxiety disorder   . Gout   . Hypertension   . Recurrent cold sores   . Seborrheic dermatitis     PAST SURGICAL HISTORY:  Past Surgical History:  Procedure Laterality Date  . VASECTOMY  2014    SOCIAL HISTORY:  Social History   Tobacco Use  . Smoking status: Never Smoker  . Smokeless tobacco: Never Used  Substance Use Topics  . Alcohol use: Yes    Comment: daily use     FAMILY HISTORY:  HTN  DRUG ALLERGIES:  Allergies  Allergen Reactions  . Sulfa Antibiotics Hives    REVIEW OF SYSTEMS:   CONSTITUTIONAL: + fever, fatigue, weakness.  EYES: No blurred or double vision.  EARS, NOSE, AND THROAT: No tinnitus or ear pain.  RESPIRATORY:+ cough, shortness of breath, wheezing no hemoptysis.  CARDIOVASCULAR: No chest pain, orthopnea, edema.   GASTROINTESTINAL: No nausea, vomiting, diarrhea or abdominal pain.  GENITOURINARY: No dysuria, hematuria.  ENDOCRINE: No polyuria, nocturia,  HEMATOLOGY: No anemia, easy bruising or bleeding SKIN: No rash or lesion. MUSCULOSKELETAL: No joint pain or arthritis.   NEUROLOGIC: No tingling, numbness, weakness.  PSYCHIATRY: No anxiety or depression.   MEDICATIONS AT HOME:  Prior to Admission medications   Medication Sig Start Date End Date Taking? Authorizing Provider  DULoxetine (CYMBALTA) 30 MG capsule TAKE ONE CAPSULE BY MOUTH EVERY DAY Patient taking differently: TAKE TWO CAPSULES BY MOUTH EVERY DAY 08/26/17  Yes Venia Carbon, MD  HYDROcodone-acetaminophen (NORCO/VICODIN) 5-325 MG tablet Take 1 tablet by mouth 2 (two) times daily as needed for moderate pain. 12/04/17  Yes Venia Carbon, MD  indomethacin (INDOCIN) 50 MG capsule Take 1 capsule (50 mg total) by mouth 2 (two) times daily as needed. 12/04/17  Yes Venia Carbon, MD  LORazepam (ATIVAN) 1 MG tablet Take 1 mg by mouth daily as needed. for anxiety 09/12/17  Yes [provider]  losartan (COZAAR) 100 MG tablet Take 1 tablet (100 mg total) by mouth daily. 08/07/17  Yes Venia Carbon, MD  polyethylene glycol [facility] / GLYCOLAX) packet Take by mouth as needed.  07/27/17  Yes [provider]  sildenafil (REVATIO) 20 MG tablet TAKE 3 TO 5 TABLETS DAILY AS NEEDED 10/17/17  Yes [provider]      PHYSICAL EXAMINATION:   VITAL SIGNS: Blood pressure (!) 108/59, pulse (!) 119, temperature (!) 102.8  F (39.3 C), temperature source Oral, resp. rate (!) 26, height 6\' 2"  (1.88 m), weight 113.4 kg (250 lb), SpO2 94 %.  GENERAL:  37 y.o.-year-old patient lying in the bed with mild acute distress.  Obese, nontoxic-appearing/diaphoretic EYES: Pupils equal, round, reactive to light and accommodation. No scleral icterus. Extraocular muscles intact.  HEENT: Head atraumatic, normocephalic. Oropharynx and  nasopharynx clear.  NECK:  Supple, no jugular venous distention. No thyroid enlargement, no tenderness.  LUNGS: Normal breath sounds bilaterally, no wheezing, rales,rhonchi or crepitation. No use of accessory muscles of respiration.  CARDIOVASCULAR: S1, S2 normal. No murmurs, rubs, or gallops.  ABDOMEN: Soft, nontender, nondistended. Bowel sounds present. No organomegaly or mass.  EXTREMITIES: No pedal edema, cyanosis, or clubbing.  NEUROLOGIC: Cranial nerves II through XII are intact. Muscle strength 5/5 in all extremities. Sensation intact. Gait not checked.  PSYCHIATRIC: The patient is alert and oriented x 3.  SKIN: No obvious rash, lesion, or ulcer.   LABORATORY PANEL:   CBC Recent Labs  Lab 12/24/17 1114 12/26/17 2102  WBC 6.9 7.7  HGB 11.2* 11.4*  HCT 32.1* 32.3*  PLT 96.0* 81*  MCV 97.3 95.0  MCH  --  33.5  MCHC 34.8 35.3  RDW 17.2* 16.3*  LYMPHSABS 0.4* 0.5*  MONOABS 0.9 0.8  EOSABS 0.0 0.0  BASOSABS 0.0 0.0   ------------------------------------------------------------------------------------------------------------------  Chemistries  Recent Labs  Lab 12/26/17 2102  NA 132*  K 3.4*  CL 98*  CO2 21*  GLUCOSE 117*  BUN 14  CREATININE 0.89  CALCIUM 9.3  AST 117*  ALT 138*  ALKPHOS 68  BILITOT 1.0   ------------------------------------------------------------------------------------------------------------------ estimated creatinine clearance is 150.7 mL/min (by C-G formula based on SCr of 0.89 mg/dL). ------------------------------------------------------------------------------------------------------------------ No results for input(s): TSH, T4TOTAL, T3FREE, THYROIDAB in the last 72 hours.  Invalid input(s): FREET3   Coagulation profile No results for input(s): INR, PROTIME in the last 168 hours. ------------------------------------------------------------------------------------------------------------------- No results for input(s): DDIMER in the  last 72 hours. -------------------------------------------------------------------------------------------------------------------  Cardiac Enzymes Recent Labs  Lab 12/26/17 2102  TROPONINI <0.03   ------------------------------------------------------------------------------------------------------------------ Invalid input(s): POCBNP  ---------------------------------------------------------------------------------------------------------------  Urinalysis    Component Value Date/Time   COLORURINE YELLOW (A) 12/26/2017 2122   APPEARANCEUR HAZY (A) 12/26/2017 2122   LABSPEC 1.012 12/26/2017 2122   PHURINE 5.0 12/26/2017 2122   GLUCOSEU NEGATIVE 12/26/2017 2122   HGBUR MODERATE (A) 12/26/2017 2122   BILIRUBINUR NEGATIVE 12/26/2017 2122   KETONESUR NEGATIVE 12/26/2017 2122   PROTEINUR NEGATIVE 12/26/2017 2122   UROBILINOGEN 0.2 01/10/2012 1455   NITRITE NEGATIVE 12/26/2017 2122   LEUKOCYTESUR NEGATIVE 12/26/2017 2122     RADIOLOGY: Dg Chest Port 1 View  Result Date: 12/26/2017 CLINICAL DATA:  Acute onset of chills, generalized back pain and nausea. Fever. Current history of high-grade large B-cell lymphoma, in remission. EXAM: PORTABLE CHEST 1 VIEW COMPARISON:  PET/CT performed 07/04/2017 FINDINGS: The lungs are well-aerated. Mild bibasilar airspace opacities raise concern for pneumonia. There is no evidence of pleural effusion or pneumothorax. The cardiomediastinal silhouette is within normal limits. No acute osseous abnormalities are seen. IMPRESSION: Mild bibasilar airspace opacities raise concern for pneumonia. Electronically Signed   By: Garald Balding M.D.   On: 12/26/2017 21:24    EKG: Orders placed or performed during the hospital encounter of 12/26/17  . ED EKG 12-Lead  . ED EKG 12-Lead  . EKG 12-Lead  . EKG 12-Lead    IMPRESSION AND PLAN: 1 acute sepsis secondary to bilateral pneumonia Compounded by immunosuppression from B-cell lymphoma continue  sepsis  protocol, Follow-up on cultures, empiric Rocephin/azithromycin for now  2 acute bilateral pneumonia, community-acquired Compounded by immunosuppression stated above Pneumonia protocol, empiric Rocephin/azithromycin, follow-up on cultures   3 chronic B-cell lymphoma Completed chemotherapy January 2019, in remission will need to follow-up with Oncology status post discharge for continued surveillance/care  4 acute on chronically elevated liver function tests Exacerbated by above  Avoid hepatotoxic agents  5 acute hyponatremia, hypokalemia, hypochloremia Replete with IV fluids, check magnesium level, BMP in the morning  Disposition home in 2-3 days barring any complications  All the records are reviewed and case discussed with ED provider. Management plans discussed with the patient, family and they are in agreement.  CODE STATUS:full Code Status History    Date Active Date Inactive Code Status Order ID Comments User Context   07/02/2017 20:34 07/05/2017 18:27 Full Code [id number]  Harrie Foreman, MD Inpatient   05/05/2017 20:36 05/08/2017 16:43 Full Code [id number]  Vianne Bulls, MD ED       TOTAL TIME TAKING CARE OF THIS PATIENT: 45 minutes.    Avel Peace Salary M.D on 12/26/2017   Between 7am to 6pm - Pager - [phone]  After 6pm go to [email or web address] - password [email or web address] [facility]  Office  [phone]  CC: Primary care physician; Venia Carbon, MD   Note: This dictation was prepared with Dragon dictation along with smaller phrase technology. Any transcriptional errors that result from this process are unintentional.

## 2017-12-26 NOTE — Progress Notes (Signed)
OR received for AD at 22:33 for patient. Chaplain responded at 22:55.Chaplain went to the ED to inquire if the AD was time sensitive. The nurse who entered the request was no longer on the unit, so the Chaplain went to ED03. The patient was alarmed to see a "chaplain" and asked if his cancer had returned. I assured the patient and his wife that the AD was routine. Neither had requested the AD, however, they would like to talk with a chaplain tomorrow afternoon about completing the AD.

## 2017-12-26 NOTE — ED Triage Notes (Signed)
Pt reports onset of chills, back pain, and nausea tonight. Pt reports he had temp 104.3 at home, took tylenol within the hour. Remission from high grade large b cell  lymphoma since Feb 8. Pt says he has had recent cold symptoms, see provider for the same.

## 2017-12-26 NOTE — Progress Notes (Signed)
CODE SEPSIS - PHARMACY COMMUNICATION  **Broad Spectrum Antibiotics should be administered within 1 hour of Sepsis diagnosis**  Time Code Sepsis Called/Page Received: 2053  Antibiotics Ordered: vanc/cefepime  Time of 1st antibiotic administration: 2156  Additional action taken by pharmacy:   If necessary, Name of Provider/Nurse Contacted:     Tobie Lords ,PharmD Clinical Pharmacist  12/26/2017  10:11 PM

## 2017-12-26 NOTE — ED Provider Notes (Signed)
[facility] Emergency Department Provider Note    None    (approximate)  I have reviewed the triage vital signs and the nursing notes.   HISTORY  Chief Complaint Fever    HPI Mark Burns is a 37 y.o. male with a history of diffuse large B-cell lymphoma not currently on chemotherapy and has had port removed last week presents with chief complaint of chills back pain some shortness of breath that started this evening.  Feels that he had flulike illness earlier in the week but was checked for strep throat and flu.  Got home today as he was feeling particularly more ill he checked his temperature and was 104.3.  Denies any abdominal pain.  No dysuria or hematuria.  No history of kidney stones.  Past Medical History:  Diagnosis Date  . ADHD, predominantly inattentive type   . Diffuse large B cell lymphoma ([place])   . Elevated LFTs 11/19/2013  . Erectile dysfunction   . Generalized anxiety disorder   . Gout   . Hypertension   . Recurrent cold sores   . Seborrheic dermatitis    History reviewed. No pertinent family history. Past Surgical History:  Procedure Laterality Date  . VASECTOMY  2014   Patient Active Problem List   Diagnosis Date Noted  . Sepsis ([place]) 12/26/2017  . Fever 12/24/2017  . Diffuse large B cell lymphoma ([place])   . Gout   . Alcohol abuse, daily use 05/05/2017  . Effusion of right knee joint 05/05/2017  . Preventative health care 03/19/2016  . Chronic back pain 03/19/2016  . Recurrent cold sores   . Seborrheic dermatitis   . ADHD, predominantly inattentive type   . Generalized anxiety disorder   . Elevated LFTs 11/19/2013  . Erectile dysfunction   . Hypertension       Prior to Admission medications   Medication Sig Start Date End Date Taking? Authorizing Provider  DULoxetine (CYMBALTA) 30 MG capsule TAKE ONE CAPSULE BY MOUTH EVERY DAY Patient taking differently: TAKE TWO CAPSULES BY MOUTH EVERY DAY 08/26/17  Yes Venia Carbon, MD  HYDROcodone-acetaminophen (NORCO/VICODIN) 5-325 MG tablet Take 1 tablet by mouth 2 (two) times daily as needed for moderate pain. 12/04/17  Yes Venia Carbon, MD  indomethacin (INDOCIN) 50 MG capsule Take 1 capsule (50 mg total) by mouth 2 (two) times daily as needed. 12/04/17  Yes Venia Carbon, MD  LORazepam (ATIVAN) 1 MG tablet Take 1 mg by mouth daily as needed. for anxiety 09/12/17  Yes [provider]  losartan (COZAAR) 100 MG tablet Take 1 tablet (100 mg total) by mouth daily. 08/07/17  Yes Venia Carbon, MD  polyethylene glycol [facility] / GLYCOLAX) packet Take by mouth as needed.  07/27/17  Yes [provider]  sildenafil (REVATIO) 20 MG tablet TAKE 3 TO 5 TABLETS DAILY AS NEEDED 10/17/17  Yes [provider]    Allergies Sulfa antibiotics    Social History Social History   Tobacco Use  . Smoking status: Never Smoker  . Smokeless tobacco: Never Used  Substance Use Topics  . Alcohol use: Yes    Comment: daily use   . Drug use: No    Review of Systems Patient denies headaches, rhinorrhea, blurry vision, numbness, shortness of breath, chest pain, edema, cough, abdominal pain, nausea, vomiting, diarrhea, dysuria, fevers, rashes or hallucinations unless otherwise stated above in HPI. ____________________________________________   PHYSICAL EXAM:  VITAL SIGNS: Vitals:   12/26/17 2200 12/26/17 2231  BP: (!) 100/37 (!) 108/59  Pulse: (!) 112 (!) 119  Resp: (!) 24 (!) 26  Temp:    SpO2: 96% 94%    Constitutional: Alert and oriented. Acutely ill appearing Eyes: Conjunctivae are normal.  Head: Atraumatic. Nose: No congestion/rhinnorhea. Mouth/Throat: Mucous membranes are moist.   Neck: No stridor. Painless ROM.  Cardiovascular: Normal rate, regular rhythm. Grossly normal heart sounds.  Good peripheral circulation. Respiratory: Normal respiratory effort.  No retractions. Lungs with bibasilar inspiratory  crackles Gastrointestinal: Soft and nontender. No distention. No abdominal bruits. No CVA tenderness. Genitourinary:  Musculoskeletal: No lower extremity tenderness nor edema.  No joint effusions. Neurologic:  Normal speech and language. No gross focal neurologic deficits are appreciated. No facial droop Skin:  Skin is warm, dry and intact. No rash noted. Psychiatric: Mood and affect are normal. Speech and behavior are normal.  ____________________________________________   LABS (all labs ordered are listed, but only abnormal results are displayed)  Results for orders placed or performed during the hospital encounter of 12/26/17 (from the past 24 hour(s))  Lactic acid, plasma     Status: Abnormal   Collection Time: 12/26/17  9:01 PM  Result Value Ref Range   Lactic Acid, Venous 2.7 (HH) 0.5 - 1.9 mmol/L  Comprehensive metabolic panel     Status: Abnormal   Collection Time: 12/26/17  9:02 PM  Result Value Ref Range   Sodium 132 (L) 135 - 145 mmol/L   Potassium 3.4 (L) 3.5 - 5.1 mmol/L   Chloride 98 (L) 101 - 111 mmol/L   CO2 21 (L) 22 - 32 mmol/L   Glucose, Bld 117 (H) 65 - 99 mg/dL   BUN 14 6 - 20 mg/dL   Creatinine, Ser 0.89 0.61 - 1.24 mg/dL   Calcium 9.3 8.9 - 10.3 mg/dL   Total Protein 7.1 6.5 - 8.1 g/dL   Albumin 4.6 3.5 - 5.0 g/dL   AST 117 (H) 15 - 41 U/L   ALT 138 (H) 17 - 63 U/L   Alkaline Phosphatase 68 38 - 126 U/L   Total Bilirubin 1.0 0.3 - 1.2 mg/dL   GFR calc non Af Amer >60 >60 mL/min   GFR calc Af Amer >60 >60 mL/min   Anion gap 13 5 - 15  Troponin I     Status: None   Collection Time: 12/26/17  9:02 PM  Result Value Ref Range   Troponin I <0.03 <0.03 ng/mL  CBC WITH DIFFERENTIAL     Status: Abnormal   Collection Time: 12/26/17  9:02 PM  Result Value Ref Range   WBC 7.7 3.8 - 10.6 K/uL   RBC 3.40 (L) 4.40 - 5.90 MIL/uL   Hemoglobin 11.4 (L) 13.0 - 18.0 g/dL   HCT 32.3 (L) 40.0 - 52.0 %   MCV 95.0 80.0 - 100.0 fL   MCH 33.5 26.0 - 34.0 pg   MCHC 35.3  32.0 - 36.0 g/dL   RDW 16.3 (H) 11.5 - 14.5 %   Platelets 81 (L) 150 - 440 K/uL   Neutrophils Relative % 77 %   Lymphocytes Relative 7 %   Monocytes Relative 10 %   Eosinophils Relative 0 %   Basophils Relative 0 %   Band Neutrophils 3 %   Metamyelocytes Relative 2 %   Myelocytes 1 %   Promyelocytes Absolute 0 %   Blasts 0 %   nRBC 0 0 /100 WBC   Other 0 %   Neutro Abs 6.4 1.4 - 6.5 K/uL  Lymphs Abs 0.5 (L) 1.0 - 3.6 K/uL   Monocytes Absolute 0.8 0.2 - 1.0 K/uL   Eosinophils Absolute 0.0 0 - 0.7 K/uL   Basophils Absolute 0.0 0 - 0.1 K/uL   RBC Morphology MIXED RBC POPULATION   Urinalysis, Complete w Microscopic     Status: Abnormal   Collection Time: 12/26/17  9:22 PM  Result Value Ref Range   Color, Urine YELLOW (A) YELLOW   APPearance HAZY (A) CLEAR   Specific Gravity, Urine 1.012 1.005 - 1.030   pH 5.0 5.0 - 8.0   Glucose, UA NEGATIVE NEGATIVE mg/dL   Hgb urine dipstick MODERATE (A) NEGATIVE   Bilirubin Urine NEGATIVE NEGATIVE   Ketones, ur NEGATIVE NEGATIVE mg/dL   Protein, ur NEGATIVE NEGATIVE mg/dL   Nitrite NEGATIVE NEGATIVE   Leukocytes, UA NEGATIVE NEGATIVE   RBC / HPF 0-5 0 - 5 RBC/hpf   WBC, UA NONE SEEN 0 - 5 WBC/hpf   Bacteria, UA NONE SEEN NONE SEEN   Squamous Epithelial / LPF NONE SEEN NONE SEEN   Mucus PRESENT    ____________________________________________  EKG My review and personal interpretation at Time: 20:49   Indication: sepsis  Rate: 135  Rhythm: sinus Axis: normal Other: normal intervals, no stemi ____________________________________________  RADIOLOGY  I personally reviewed all radiographic images ordered to evaluate for the above acute complaints and reviewed radiology reports and findings.  These findings were personally discussed with the patient.  Please see medical record for radiology report.  ____________________________________________   PROCEDURES  Procedure(s) performed:  .Critical Care Performed by: Merlyn Lot,  MD Authorized by: Merlyn Lot, MD   Critical care provider statement:    Critical care time (minutes):  35   Critical care time was exclusive of:  Separately billable procedures and treating other patients   Critical care was necessary to treat or prevent imminent or life-threatening deterioration of the following conditions:  Sepsis   Critical care was time spent personally by me on the following activities:  Development of treatment plan with patient or surrogate, discussions with consultants, evaluation of patient's response to treatment, examination of patient, obtaining history from patient or surrogate, ordering and performing treatments and interventions, ordering and review of laboratory studies, ordering and review of radiographic studies, pulse oximetry, re-evaluation of patient's condition and review of old charts      Critical Care performed: yes ____________________________________________   INITIAL IMPRESSION / [place] / ED COURSE  Pertinent labs & imaging results that were available during my care of the patient were reviewed by me and considered in my medical decision making (see chart for details).  DDX: Pneumonia, bacteremia, viral syndrome, myositis, UTI, [place], stone, enteritis, colitis, appendicitis  Mataio Mele is a 37 y.o. who presents to the ED with symptoms as described above.  Patient is acutely ill-appearing crying IV fluids for resuscitation.  Certainly concerning for sepsis given his history of lymphoma on chemotherapy.  Chest x-ray ordered for above differential shows evidence of probable early pneumonia.  Will repeat flu.  Does have a lactate elevation to 2.7.  Also concern for bacteremia given recent port removal.  His abdominal exam is soft and benign.  Urinalysis shows no evidence of UTI or hematuria.  Patient will be given broad-spectrum antibiotics after obtaining cultures for further evaluation.  Do feel patient will require admission the  hospital for further medical management.      As part of my medical decision making, I reviewed the following data within the electronic  MEDICAL RECORD NUMBER Nursing notes reviewed and incorporated, Labs reviewed, notes from prior ED visits.   ____________________________________________   FINAL CLINICAL IMPRESSION(S) / ED DIAGNOSES  Final diagnoses:  Sepsis, due to unspecified organism [facility])      NEW MEDICATIONS STARTED DURING THIS VISIT:  New Prescriptions   No medications on file     Note:  This document was prepared using Dragon voice recognition software and may include unintentional dictation errors.    Merlyn Lot, MD 12/26/17 2246

## 2017-12-26 NOTE — ED Notes (Signed)
Critical lactic lab value of 2.7 made aware to Dr. Quentin Cornwall.

## 2017-12-27 ENCOUNTER — Inpatient Hospital Stay: Payer: BC Managed Care – PPO

## 2017-12-27 ENCOUNTER — Encounter: Payer: Self-pay | Admitting: Radiology

## 2017-12-27 LAB — EXPECTORATED SPUTUM ASSESSMENT W GRAM STAIN, RFLX TO RESP C

## 2017-12-27 LAB — MAGNESIUM: MAGNESIUM: 1.2 mg/dL — AB (ref 1.7–2.4)

## 2017-12-27 LAB — LACTIC ACID, PLASMA: Lactic Acid, Venous: 1.8 mmol/L (ref 0.5–1.9)

## 2017-12-27 LAB — STREP PNEUMONIAE URINARY ANTIGEN: Strep Pneumo Urinary Antigen: POSITIVE — AB

## 2017-12-27 MED ORDER — ACETAMINOPHEN 500 MG PO TABS: 1000.0000 mg | ORAL_TABLET | Freq: Four times a day (QID) | ORAL | Status: DC | PRN

## 2017-12-27 MED ORDER — INDOMETHACIN 50 MG PO CAPS
50.0000 mg | ORAL_CAPSULE | Freq: Two times a day (BID) | ORAL | Status: DC | PRN
  Filled 2017-12-27: qty 1

## 2017-12-27 MED ORDER — TEMAZEPAM 15 MG PO CAPS
15.0000 mg | ORAL_CAPSULE | Freq: Every evening | ORAL | Status: DC | PRN
  Administered 2017-12-27 – 2017-12-28 (×3): 15 mg via ORAL
  Filled 2017-12-27 (×3): qty 1

## 2017-12-27 MED ORDER — IOPAMIDOL (ISOVUE-300) INJECTION 61%
100.0000 mL | Freq: Once | INTRAVENOUS | Status: AC | PRN
  Administered 2017-12-27: 100 mL via INTRAVENOUS

## 2017-12-27 MED ORDER — HYDROCODONE-ACETAMINOPHEN 5-325 MG PO TABS
1.0000 | ORAL_TABLET | Freq: Four times a day (QID) | ORAL | Status: DC | PRN
  Administered 2017-12-27: 2 via ORAL
  Administered 2017-12-27: 1 via ORAL
  Administered 2017-12-27 – 2017-12-28 (×4): 2 via ORAL
  Filled 2017-12-27 (×6): qty 2

## 2017-12-27 MED ORDER — ACETAMINOPHEN 500 MG PO TABS
ORAL_TABLET | ORAL | Status: AC
  Filled 2017-12-27: qty 2

## 2017-12-27 MED ORDER — ACETAMINOPHEN 500 MG PO TABS
1000.0000 mg | ORAL_TABLET | Freq: Four times a day (QID) | ORAL | Status: DC | PRN
  Administered 2017-12-27 (×2): 1000 mg via ORAL
  Filled 2017-12-27: qty 2

## 2017-12-27 MED ORDER — HYDROCODONE-ACETAMINOPHEN 5-325 MG PO TABS: 1.0000 | ORAL_TABLET | Freq: Four times a day (QID) | ORAL | Status: DC | PRN

## 2017-12-27 MED ORDER — IOPAMIDOL (ISOVUE-300) INJECTION 61%
15.0000 mL | INTRAVENOUS | Status: AC
  Administered 2017-12-27 (×2): 15 mL via ORAL

## 2017-12-27 MED ORDER — IPRATROPIUM-ALBUTEROL 0.5-2.5 (3) MG/3ML IN SOLN
3.0000 mL | Freq: Four times a day (QID) | RESPIRATORY_TRACT | Status: DC | PRN
  Administered 2017-12-27: 3 mL via RESPIRATORY_TRACT
  Filled 2017-12-27: qty 3

## 2017-12-27 MED ORDER — CYCLOBENZAPRINE HCL 10 MG PO TABS
10.0000 mg | ORAL_TABLET | Freq: Three times a day (TID) | ORAL | Status: DC
  Administered 2017-12-27 – 2017-12-29 (×8): 10 mg via ORAL
  Filled 2017-12-27 (×8): qty 1

## 2017-12-27 MED ORDER — MAGNESIUM SULFATE 4 GM/100ML IV SOLN
4.0000 g | Freq: Once | INTRAVENOUS | Status: AC
  Administered 2017-12-27: 4 g via INTRAVENOUS
  Filled 2017-12-27: qty 100

## 2017-12-27 NOTE — Progress Notes (Signed)
Mark Burns at [place] NAME: Mark Burns    MR#:  [id number]  DATE OF BIRTH:  02-16-1981  SUBJECTIVE:   Patient here due to  Sepsis secondary to pneumonia. Patient presented with high fevers and noted to have chest x-ray findings suggestive of pneumonia. Afebrile this morning. Patient is complaining of some left flank/lower chest pain. Patient's wife is at bedside.  REVIEW OF SYSTEMS:    Review of Systems  Constitutional: Negative for chills and fever.  HENT: Negative for congestion and tinnitus.   Eyes: Negative for blurred vision and double vision.  Respiratory: Positive for cough and shortness of breath. Negative for wheezing.   Cardiovascular: Positive for chest pain. Negative for orthopnea and PND.  Gastrointestinal: Negative for abdominal pain, diarrhea, nausea and vomiting.  Genitourinary: Negative for dysuria and hematuria.  Neurological: Negative for dizziness, sensory change and focal weakness.  All other systems reviewed and are negative.   Nutrition: Regular Tolerating Diet: Yes Tolerating PT: Ambulatory  DRUG ALLERGIES:   Allergies  Allergen Reactions  . Sulfa Antibiotics Hives    VITALS:  Blood pressure 117/71, pulse 86, temperature 98 F (36.7 C), temperature source Oral, resp. rate 20, height 6\' 2"  (1.88 m), weight 113.4 kg (250 lb), SpO2 92 %.  PHYSICAL EXAMINATION:   Physical Exam  GENERAL:  37 y.o.-year-old patient lying in bed in no acute distress.  EYES: Pupils equal, round, reactive to light and accommodation. No scleral icterus. Extraocular muscles intact.  HEENT: Head atraumatic, normocephalic. Oropharynx and nasopharynx clear.  NECK:  Supple, no jugular venous distention. No thyroid enlargement, no tenderness.  LUNGS: Normal breath sounds bilaterally, no wheezing, bibasliar rales, some rhonchi b/l. No use of accessory muscles of respiration.  CARDIOVASCULAR: S1, S2 normal. No murmurs, rubs, or gallops.   ABDOMEN: Soft, nontender, nondistended. Bowel sounds present. No organomegaly or mass.  EXTREMITIES: No cyanosis, clubbing or edema b/l.    NEUROLOGIC: Cranial nerves II through XII are intact. No focal Motor or sensory deficits b/l.   PSYCHIATRIC: The patient is alert and oriented x 3.  SKIN: No obvious rash, lesion, or ulcer.    LABORATORY PANEL:   CBC Recent Labs  Lab 12/26/17 2102  WBC 7.7  HGB 11.4*  HCT 32.3*  PLT 81*   ------------------------------------------------------------------------------------------------------------------  Chemistries  Recent Labs  Lab 12/26/17 2102 12/27/17 0608  NA 132*  --   K 3.4*  --   CL 98*  --   CO2 21*  --   GLUCOSE 117*  --   BUN 14  --   CREATININE 0.89  --   CALCIUM 9.3  --   MG  --  1.2*  AST 117*  --   ALT 138*  --   ALKPHOS 68  --   BILITOT 1.0  --    ------------------------------------------------------------------------------------------------------------------  Cardiac Enzymes Recent Labs  Lab 12/26/17 2102  TROPONINI <0.03   ------------------------------------------------------------------------------------------------------------------  RADIOLOGY:  Dg Chest Port 1 View  Result Date: 12/26/2017 CLINICAL DATA:  Acute onset of chills, generalized back pain and nausea. Fever. Current history of high-grade large B-cell lymphoma, in remission. EXAM: PORTABLE CHEST 1 VIEW COMPARISON:  PET/CT performed 07/04/2017 FINDINGS: The lungs are well-aerated. Mild bibasilar airspace opacities raise concern for pneumonia. There is no evidence of pleural effusion or pneumothorax. The cardiomediastinal silhouette is within normal limits. No acute osseous abnormalities are seen. IMPRESSION: Mild bibasilar airspace opacities raise concern for pneumonia. Electronically Signed   By: Garald Balding  M.D.   On: 12/26/2017 21:24     ASSESSMENT AND PLAN:   37 year old male with past medical history of diffuse large cell B lymphoma  currently in remission,anxiety, hypertension, gout, ADHD who presented to the hospital due to fever, shortness of breath and cough and noted to have pneumonia.  1. sepsis-patient meets criteria given his fever, tachycardia and chest x-ray findings suggestive of pneumonia. -continue continue IV ceftriaxone, Zithromax to treat community acquired pneumonia. Follow cultures, follow fever curve.  2. Pneumonia-source of patient's sepsis. Continue ceftriaxone, Zithromax. Follow cultures.  3. left lower chest/flank pain-etiology unclear. As per the patient and wife these symptoms patient had when he was reaching diagnosed with his lymphoma. -patient has no other acute symptoms. I will get a CT scan of his chest abdomen/pelvis and follow up.   4. Essential hypertension-continue losartan.  5. Anxiety/depression-continue Cymbalta, Ativan as needed.  6.hypomagnesemia-we'll replace accordingly and repeat level in the morning.   All the records are reviewed and case discussed with Care Management/Social Worker. Management plans discussed with the patient, family and they are in agreement.  CODE STATUS: Full Code  DVT Prophylaxis: Hep SQ  TOTAL TIME TAKING CARE OF THIS PATIENT: 30 minutes.   POSSIBLE D/C IN 1-2 DAYS, DEPENDING ON CLINICAL CONDITION.   Henreitta Leber M.D on 12/27/2017 at 2:20 PM  Between 7am to 6pm - Pager - [phone]  After 6pm go to [email or web address] - Proofreader  [facility]  Hospitalists  Office  [phone]  CC: Primary care physician; Venia Carbon, MD

## 2017-12-27 NOTE — Progress Notes (Signed)
Chaplain followed up for an AD. Pt said the had a difficult night and needed rest. Chaplain delivered another AD for the wife. Will follow up   12/27/17 1100  Clinical Encounter Type  Visited With Patient and family together  Visit Type Follow-up  Referral From [facility]

## 2017-12-28 LAB — CBC
HCT: 25.1 % — ABNORMAL LOW (ref 40.0–52.0)
HEMOGLOBIN: 8.8 g/dL — AB (ref 13.0–18.0)
MCH: 33.9 pg (ref 26.0–34.0)
MCHC: 35.2 g/dL (ref 32.0–36.0)
MCV: 96.2 fL (ref 80.0–100.0)
Platelets: 55 10*3/uL — ABNORMAL LOW (ref 150–440)
RBC: 2.61 MIL/uL — AB (ref 4.40–5.90)
RDW: 16.2 % — ABNORMAL HIGH (ref 11.5–14.5)
WBC: 3 10*3/uL — ABNORMAL LOW (ref 3.8–10.6)

## 2017-12-28 LAB — URINE CULTURE

## 2017-12-28 LAB — MAGNESIUM: Magnesium: 1.9 mg/dL (ref 1.7–2.4)

## 2017-12-28 LAB — HIV ANTIBODY (ROUTINE TESTING W REFLEX): HIV Screen 4th Generation wRfx: NONREACTIVE

## 2017-12-28 MED ORDER — BUDESONIDE 0.5 MG/2ML IN SUSP
0.5000 mg | Freq: Two times a day (BID) | RESPIRATORY_TRACT | Status: DC
  Administered 2017-12-28 – 2017-12-29 (×2): 0.5 mg via RESPIRATORY_TRACT
  Filled 2017-12-28 (×2): qty 2

## 2017-12-28 MED ORDER — METHYLPREDNISOLONE SODIUM SUCC 40 MG IJ SOLR
40.0000 mg | Freq: Every day | INTRAMUSCULAR | Status: DC
  Administered 2017-12-28 – 2017-12-29 (×2): 40 mg via INTRAVENOUS
  Filled 2017-12-28 (×2): qty 1

## 2017-12-28 MED ORDER — BUTALBITAL-APAP-CAFFEINE 50-325-40 MG PO TABS
1.0000 | ORAL_TABLET | ORAL | Status: DC | PRN
  Administered 2017-12-28: 1 via ORAL
  Filled 2017-12-28 (×2): qty 1

## 2017-12-28 MED ORDER — IPRATROPIUM-ALBUTEROL 0.5-2.5 (3) MG/3ML IN SOLN
3.0000 mL | Freq: Four times a day (QID) | RESPIRATORY_TRACT | Status: DC
  Administered 2017-12-28 – 2017-12-29 (×4): 3 mL via RESPIRATORY_TRACT
  Filled 2017-12-28 (×4): qty 3

## 2017-12-28 NOTE — Progress Notes (Signed)
Chaplain discussed AD with patient. He will not move forward at this time. Chaplain offered to return and discuss the AD with his wife. Chaplain provided active listening and emotional support.

## 2017-12-28 NOTE — Progress Notes (Signed)
Patient ID: Mark Burns, male   DOB: [birth year], 37 y.o.   MRN: [id number]  [facility] PROGRESS NOTE  Avrey Hyser IHK:[id number] DOB: Aug 01, 1981 DOA: 12/26/2017 PCP: Venia Carbon, MD  HPI/Subjective: Patient feeling a little bit better.  He thinks that things are starting to break up a little bit.  Coughing a lot.  Objective: Vitals:   12/27/17 2058 12/28/17 0526  BP: (!) 141/74 121/73  Pulse: 85 78  Resp: 18 18  Temp: (!) 100.5 F (38.1 C) 98.4 F (36.9 C)  SpO2: 96% 93%    Filed Weights   12/26/17 2038  Weight: 113.4 kg (250 lb)    ROS: Review of Systems  Constitutional: Negative for chills and fever.  Eyes: Negative for blurred vision.  Respiratory: Positive for cough and shortness of breath.   Cardiovascular: Positive for chest pain.  Gastrointestinal: Negative for abdominal pain, constipation, diarrhea, nausea and vomiting.  Genitourinary: Negative for dysuria.  Musculoskeletal: Negative for joint pain.  Neurological: Negative for dizziness and headaches.   Exam: Physical Exam  HENT:  Nose: No mucosal edema.  Mouth/Throat: No oropharyngeal exudate or posterior oropharyngeal edema.  Eyes: Conjunctivae, EOM and lids are normal. Pupils are equal, round, and reactive to light.  Neck: No JVD present. Carotid bruit is not present. No edema present. No thyroid mass and no thyromegaly present.  Cardiovascular: S1 normal and S2 normal. Exam reveals no gallop.  No murmur heard. Pulses:      Dorsalis pedis pulses are 2+ on the right side, and 2+ on the left side.  Respiratory: No respiratory distress. He has decreased breath sounds in the right lower field and the left lower field. He has wheezes in the right middle field and the left middle field. He has rhonchi in the right lower field and the left lower field. He has no rales.  GI: Soft. Bowel sounds are normal. There is no tenderness.  Musculoskeletal:       Right shoulder: He exhibits no swelling.   Lymphadenopathy:    He has no cervical adenopathy.  Neurological: He is alert. No cranial nerve deficit.  Skin: Skin is warm. No rash noted. Nails show no clubbing.  Psychiatric: He has a normal mood and affect.      Data Reviewed: Basic Metabolic Panel: Recent Labs  Lab 12/26/17 2102 12/27/17 0608 12/28/17 0455  NA 132*  --   --   K 3.4*  --   --   CL 98*  --   --   CO2 21*  --   --   GLUCOSE 117*  --   --   BUN 14  --   --   CREATININE 0.89  --   --   CALCIUM 9.3  --   --   MG  --  1.2* 1.9   Liver Function Tests: Recent Labs  Lab 12/26/17 2102  AST 117*  ALT 138*  ALKPHOS 68  BILITOT 1.0  PROT 7.1  ALBUMIN 4.6   CBC: Recent Labs  Lab 12/24/17 1114 12/26/17 2102 12/28/17 0455  WBC 6.9 7.7 3.0*  NEUTROABS 5.5 6.4  --   HGB 11.2* 11.4* 8.8*  HCT 32.1* 32.3* 25.1*  MCV 97.3 95.0 96.2  PLT 96.0* 81* 55*   Cardiac Enzymes: Recent Labs  Lab 12/26/17 2102  TROPONINI <0.03     Recent Results (from the past 240 hour(s))  Blood Culture (routine x 2)     Status: None (Preliminary result)   Collection Time: 12/26/17  9:02 PM  Result Value Ref Range Status   Specimen Description BLOOD BLOOD LEFT ARM  Final   Special Requests   Final    BOTTLES DRAWN AEROBIC AND ANAEROBIC Blood Culture results may not be optimal due to an excessive volume of blood received in culture bottles   Culture   Final    NO GROWTH 2 DAYS Performed at [facility], [address] [place], [place] [address]    Report Status PENDING  Incomplete  Blood Culture (routine x 2)     Status: None (Preliminary result)   Collection Time: 12/26/17  9:02 PM  Result Value Ref Range Status   Specimen Description BLOOD BLOOD RIGHT ARM  Final   Special Requests   Final    BOTTLES DRAWN AEROBIC AND ANAEROBIC Blood Culture adequate volume   Culture   Final    NO GROWTH 2 DAYS Performed at [facility], [address] [place], [place] [address]    Report Status PENDING   Incomplete  Culture, sputum-assessment     Status: None   Collection Time: 12/27/17 10:56 AM  Result Value Ref Range Status   Specimen Description Expect. Sput  Final   Special Requests NONE  Final   Sputum evaluation   Final    THIS SPECIMEN IS ACCEPTABLE FOR SPUTUM CULTURE Performed at [facility], [address] [place], [place] [address]    Report Status 12/27/2017 FINAL  Final  Culture, respiratory (NON-Expectorated)     Status: None (Preliminary result)   Collection Time: 12/27/17 10:56 AM  Result Value Ref Range Status   Specimen Description   Final    Expect. Sput Performed at [facility], [address] [place], [place] [address]    Special Requests   Final    NONE Reflexed from [id number] Performed at [facility], [place], [place] [address]    Gram Stain   Final    MODERATE WBC PRESENT,BOTH PMN AND MONONUCLEAR RARE SQUAMOUS EPITHELIAL CELLS PRESENT RARE GRAM POSITIVE COCCI IN PAIRS Performed at [facility], [place] [address] [place], [place] [address]    Culture PENDING  Incomplete   Report Status PENDING  Incomplete     Studies: Ct Chest W Contrast  Result Date: 12/27/2017 CLINICAL DATA:  Followup lymphoma.  Worsening pneumonia EXAM: CT CHEST, ABDOMEN, AND PELVIS WITH CONTRAST TECHNIQUE: Multidetector CT imaging of the chest, abdomen and pelvis was performed following the standard protocol during bolus administration of intravenous contrast. CONTRAST:  194mL ISOVUE-300 IOPAMIDOL (ISOVUE-300) INJECTION 61% COMPARISON:  CT chest 07/03/2017 and CT AP 07/02/2017. FINDINGS: CT CHEST FINDINGS Cardiovascular: The heart size appears normal. No pericardial effusion identified. Mediastinum/Nodes: The trachea appears patent and is midline. Unremarkable appearance of the esophagus. Normal appearance of the thyroid gland. Interval improvement in thoracic adenopathy. The index pre-vascular node measures 0.6 cm, image 17/2. Previously  0.9 cm. The index subcarinal lymph node measures 0.8 cm, image 27/2. Previously 1.4 cm. The left pre-vascular lymph node measures 0.8 cm, image 4/2. Previously 1.2 cm. Resolution of previous index right axillary node. Lungs/Pleura: Small amount of pleural fluid overlies the posterior aspect of the left midlung, image 18/2. Dense airspace consolidation is identified involving both lower lobes, left greater than right. There is also airspace consolidation within the lingula. Left lower lobe calcified granulomas again noted. Musculoskeletal: No aggressive lytic or sclerotic bone lesions. CT ABDOMEN PELVIS FINDINGS Hepatobiliary: No focal liver abnormality. The gallbladder appears normal. No biliary  dilatation. Pancreas: Normal appearance of the pancreas. Spleen: The spleen measures 14.8 by 6.7 by 16 cm (volume = 830 cm^3)., Adrenals/Urinary Tract: The adrenal glands appear normal. Unremarkable appearance of both kidneys. Urinary bladder appears normal. Stomach/Bowel: The stomach is unremarkable. The small bowel loops have a normal course and caliber. There is no bowel obstruction. The appendix is visualized and appears normal. Unremarkable appearance of the colon. Vascular/Lymphatic: Mild aortic atherosclerosis. No aneurysm. The index portacaval lymph node measures 1.5 cm, image 61/2. Previously 3.4 cm. The peripancreatic node measures 0.9 cm, image 59/2. Previously 2.3 cm. Left retroperitoneal lymph node Measures 0.6 cm, image 87/2. Previously 1.2 cm. No new or progressive adenopathy identified. Reproductive: Prostate is unremarkable. Other: No ascites or focal fluid collections identified. Musculoskeletal: Degenerative disc disease noted within the lower thoracic and lumbar spine. No suspicious bone lesions. IMPRESSION: 1. Interval response to therapy with significant decrease in size of thoracic and abdominal adenopathy. 2. Persistent splenomegaly. 3. There is dense airspace consolidation involving both lower lobes  and lingula. Findings are compatible with pneumonia. Follow-up imaging is advised to ensure resolution. 4.  Aortic Atherosclerosis (ICD10-I70.0). Electronically Signed   By: Kerby Moors M.D.   On: 12/27/2017 15:58   Ct Abdomen Pelvis W Contrast  Result Date: 12/27/2017 CLINICAL DATA:  Worsening pneumonia and left chest and abdominal pain. Diffuse large B-cell lymphoma. EXAM: CT CHEST, ABDOMEN, AND PELVIS WITH CONTRAST TECHNIQUE: Multidetector CT imaging of the chest, abdomen and pelvis was performed following the standard protocol during bolus administration of intravenous contrast. CONTRAST:  133mL ISOVUE-300 IOPAMIDOL (ISOVUE-300) INJECTION 61% COMPARISON:  PET-CT from 07/04/2017 FINDINGS: CT CHEST FINDINGS Cardiovascular: Unremarkable Mediastinum/Nodes: Numerous small mediastinal lymph nodes are present, reduced in size compared to 07/04/2017. For example, a left paratracheal lymph node on image 16/2 measures 0.7 cm in short axis, previously 1.0 cm. Lungs/Pleura: Airspace opacities in both lower lobes and posteriorly in the lingula are present, significantly increased from the prior bandlike atelectasis shown on the PET-CT from 07/04/2017, and suspicious for bilateral pneumonia with associated air bronchograms. Small left pleural effusion. Calcified granulomas are also again seen in the left lower lobe. Musculoskeletal: Unremarkable CT ABDOMEN PELVIS FINDINGS Hepatobiliary: Mild gallbladder wall thickening versus trace pericholecystic fluid. The liver appears otherwise unremarkable. Pancreas: Unremarkable Spleen: The spleen measures 16.4 by 6.5 by 15.4 cm (volume = 860 cm^3), which is improved compared to the 07/04/2017 PET-CT. Adrenals/Urinary Tract: Unremarkable Stomach/Bowel: Air-levels in the distal colon are compatible with diarrheal process. There scattered air-fluid levels in nondilated loops of small bowel. No effacement of the jejunal folds identified. Vascular/Lymphatic: Notable improvement in  prior adenopathy. For example, a peripancreatic node measures 0.8 cm in short axis on image 59/2, previously 2.6 cm. A portacaval node measures 1.4 cm in short axis on image 61/2, formerly 3.8 cm. Likewise the other upper abdominal lymph nodes have significantly reduced in size, as have the periaortic lymph nodes. The previous mild pelvic adenopathy has resolved. Mild abdominal aortic atherosclerotic calcification is present. Reproductive: Unremarkable Other: No supplemental non-categorized findings. Musculoskeletal: Lumbar spondylosis and degenerative disc disease causing impingement at L4-5 and L5-S1. Grade 1 degenerative retrolisthesis at L4-5 and L5-S1. IMPRESSION: 1. Airspace opacities in both lower lobes and in the lingula, suspicious for pneumonia. Trace left pleural effusion. 2. Considerable improvement in prior adenopathy, with current thoracic, abdominal, and pelvic lymph nodes not pathologically enlarged. The splenomegaly has also improved but not resolved, with splenic volume today calculated at 800 60 cubic cm. 3. Equivocal gallbladder wall thickening.  4. Air fluid levels in the distal colon suggesting diarrheal process. Scattered air-fluid levels in nondilated small bowel. 5. Other imaging findings of potential clinical significance: Aortic Atherosclerosis (ICD10-I70.0). Lumbar spondylosis and degenerative disc disease causing impingement at L4-5 and L5-S1. Electronically Signed   By: Van Clines M.D.   On: 12/27/2017 15:54   Dg Chest Port 1 View  Result Date: 12/26/2017 CLINICAL DATA:  Acute onset of chills, generalized back pain and nausea. Fever. Current history of high-grade large B-cell lymphoma, in remission. EXAM: PORTABLE CHEST 1 VIEW COMPARISON:  PET/CT performed 07/04/2017 FINDINGS: The lungs are well-aerated. Mild bibasilar airspace opacities raise concern for pneumonia. There is no evidence of pleural effusion or pneumothorax. The cardiomediastinal silhouette is within normal  limits. No acute osseous abnormalities are seen. IMPRESSION: Mild bibasilar airspace opacities raise concern for pneumonia. Electronically Signed   By: Garald Balding M.D.   On: 12/26/2017 21:24    Scheduled Meds: . budesonide (PULMICORT) nebulizer solution  0.5 mg Nebulization BID  . cyclobenzaprine  10 mg Oral TID  . DULoxetine  60 mg Oral Daily  . heparin  5,000 Units Subcutaneous Q8H  . ipratropium-albuterol  3 mL Nebulization Q6H  . losartan  100 mg Oral Daily  . methylPREDNISolone (SOLU-MEDROL) injection  40 mg Intravenous Daily  . polyethylene glycol  17 g Oral Once   Continuous Infusions: . cefTRIAXone (ROCEPHIN)  IV 1 g (12/28/17 1219)    Assessment/Plan:  1. Clinical sepsis with bilateral pneumonia.  Continue IV Rocephin.  Streptococcus pneumonia urine antigen positive.  Sputum culture growing gram-positive cocci in pairs. 2. Patient also with wheezing.  Start Solu-Medrol and nebulizer treatments with budesonide and DuoNeb nebulizer solution 3. Hypertension on losartan 4. History of B cell lymphoma  5. anxiety depression on Cymbalta 6. Hypomagnesemia improved 7. Lactic acidosis trending better 8. Drop in hemoglobin.  Recheck tomorrow.  Discontinue IV fluid hydration.  Code Status:     Code Status Orders  (From admission, onward)        Start     Ordered   12/26/17 2326  Full code  Continuous     12/26/17 2325    Code Status History    Date Active Date Inactive Code Status Order ID Comments User Context   07/02/2017 20:34 07/05/2017 18:27 Full Code [id number]  Harrie Foreman, MD Inpatient   05/05/2017 20:36 05/08/2017 16:43 Full Code [id number]  Vianne Bulls, MD ED     Disposition Plan: Evaluate daily on when to go home  Antibiotics:  Rocephin  Time spent: 25 minutes  [place]

## 2017-12-28 NOTE — Progress Notes (Signed)
Med for H/A and back pain this morning w/good relief-beginning to have back pain again this afternoon, flexeril and Lortab given.

## 2017-12-29 LAB — CBC
HCT: 26.4 % — ABNORMAL LOW (ref 40.0–52.0)
Hemoglobin: 9.5 g/dL — ABNORMAL LOW (ref 13.0–18.0)
MCH: 33.8 pg (ref 26.0–34.0)
MCHC: 35.9 g/dL (ref 32.0–36.0)
MCV: 94.2 fL (ref 80.0–100.0)
PLATELETS: 67 10*3/uL — AB (ref 150–440)
RBC: 2.8 MIL/uL — ABNORMAL LOW (ref 4.40–5.90)
RDW: 15.3 % — AB (ref 11.5–14.5)
WBC: 2.2 10*3/uL — AB (ref 3.8–10.6)

## 2017-12-29 MED ORDER — AMOXICILLIN-POT CLAVULANATE 875-125 MG PO TABS: 1.0000 | ORAL_TABLET | Freq: Two times a day (BID) | ORAL | 0 refills | Status: DC

## 2017-12-29 MED ORDER — GUAIFENESIN-DM 100-10 MG/5ML PO SYRP: 5.0000 mL | ORAL_SOLUTION | ORAL | 0 refills | Status: DC | PRN

## 2017-12-29 MED ORDER — AMOXICILLIN-POT CLAVULANATE 875-125 MG PO TABS: 1.0000 | ORAL_TABLET | Freq: Two times a day (BID) | ORAL | Status: DC

## 2017-12-29 MED ORDER — PREDNISONE 20 MG PO TABS: ORAL_TABLET | ORAL | 0 refills | Status: DC

## 2017-12-29 MED ORDER — PREDNISONE 20 MG PO TABS: 40.0000 mg | ORAL_TABLET | Freq: Every day | ORAL | Status: DC

## 2017-12-29 MED ORDER — ALBUTEROL SULFATE HFA 108 (90 BASE) MCG/ACT IN AERS: 2.0000 | INHALATION_SPRAY | Freq: Four times a day (QID) | RESPIRATORY_TRACT | 0 refills | Status: DC | PRN

## 2017-12-29 NOTE — Discharge Summary (Signed)
[place] [facility] [place] NAME: Mark Burns    MR#:  [id number]  DATE OF BIRTH:  Feb 15, 1981  DATE OF ADMISSION:  12/26/2017 ADMITTING PHYSICIAN: Gorden Harms, MD  DATE OF DISCHARGE: 12/29/2017 12:37 PM  PRIMARY CARE PHYSICIAN: Venia Carbon, MD    ADMISSION DIAGNOSIS:  Sepsis, due to unspecified organism ([place]) [A41.9]  DISCHARGE DIAGNOSIS:  Active Problems:   Sepsis ([place])   SECONDARY DIAGNOSIS:   Past Medical History:  Diagnosis Date  . ADHD, predominantly inattentive type   . Diffuse large B cell lymphoma ([place])   . Elevated LFTs 11/19/2013  . Erectile dysfunction   . Generalized anxiety disorder   . Gout   . Hypertension   . Recurrent cold sores   . Seborrheic dermatitis     HOSPITAL COURSE:   1.  Clinical sepsis with bilateral pneumonia.  The patient was on IV Rocephin.  Streptococcus pneumonia urine antigen was positive.  Sputum culture growing gram-positive cocci in pairs.  Patient feeling much better.  Discharged home on Augmentin. 2.  Wheeze.  This has improved with starting of Solu-Medrol.  The patient will receive another dose of Solu-Medrol and I will send him home on prednisone 40 mg daily for 3 more days.  Albuterol inhaler. 3.  Essential hypertension on losartan 4.  History of B-cell lymphoma 5.  Anxiety depression on Cymbalta 6.  Hypomagnesemia improved 7.  Lactic acid improved 8.  Drop in hemoglobin likely dilutional with IV fluids.  Hemoglobin improved to 9.5 upon discharge.   DISCHARGE CONDITIONS:   Satisfactory  CONSULTS OBTAINED:  None  DRUG ALLERGIES:   Allergies  Allergen Reactions  . Sulfa Antibiotics Hives    DISCHARGE MEDICATIONS:   Allergies as of 12/29/2017      Reactions   Sulfa Antibiotics Hives      Medication List    TAKE these medications   albuterol 108 (90 Base) MCG/ACT inhaler Commonly known as:  PROVENTIL HFA;VENTOLIN HFA Inhale 2 puffs into the lungs every 6 (six) hours  as needed for wheezing or shortness of breath.   amoxicillin-clavulanate 875-125 MG tablet Commonly known as:  AUGMENTIN Take 1 tablet by mouth every 12 (twelve) hours. Start taking on:  12/30/2017   DULoxetine 30 MG capsule Commonly known as:  CYMBALTA TAKE ONE CAPSULE BY MOUTH EVERY DAY What changed:    how much to take  how to take this  when to take this   guaiFENesin-dextromethorphan 100-10 MG/5ML syrup Commonly known as:  ROBITUSSIN DM Take 5 mLs by mouth every 4 (four) hours as needed for cough.   HYDROcodone-acetaminophen 5-325 MG tablet Commonly known as:  NORCO/VICODIN Take 1 tablet by mouth 2 (two) times daily as needed for moderate pain.   indomethacin 50 MG capsule Commonly known as:  INDOCIN Take 1 capsule (50 mg total) by mouth 2 (two) times daily as needed.   LORazepam 1 MG tablet Commonly known as:  ATIVAN Take 1 mg by mouth daily as needed. for anxiety   losartan 100 MG tablet Commonly known as:  COZAAR Take 1 tablet (100 mg total) by mouth daily.   polyethylene glycol packet Commonly known as:  MIRALAX / GLYCOLAX Take by mouth as needed.   predniSONE 20 MG tablet Commonly known as:  DELTASONE 2 tabs daily for three days then stop   sildenafil 20 MG tablet Commonly known as:  REVATIO TAKE 3 TO 5 TABLETS DAILY AS NEEDED  DISCHARGE INSTRUCTIONS:    Follow-up PMD 1 week  If you experience worsening of your admission symptoms, develop shortness of breath, life threatening emergency, suicidal or homicidal thoughts you must seek medical attention immediately by calling 911 or calling your MD immediately  if symptoms less severe.  You Must read complete instructions/literature along with all the possible adverse reactions/side effects for all the Medicines you take and that have been prescribed to you. Take any new Medicines after you have completely understood and accept all the possible adverse reactions/side effects.   Please  note  You were cared for by a hospitalist during your hospital stay. If you have any questions about your discharge medications or the care you received while you were in the hospital after you are discharged, you can call the unit and asked to speak with the hospitalist on call if the hospitalist that took care of you is not available. Once you are discharged, your primary care physician will handle any further medical issues. Please note that NO REFILLS for any discharge medications will be authorized once you are discharged, as it is imperative that you return to your primary care physician (or establish a relationship with a primary care physician if you do not have one) for your aftercare needs so that they can reassess your need for medications and monitor your lab values.    Today   CHIEF COMPLAINT:   Chief Complaint  Patient presents with  . Fever    HISTORY OF PRESENT ILLNESS:  Mark Burns  is a 37 y.o. male came in with fever and found to have pneumonia   VITAL SIGNS:  Blood pressure 125/76, pulse 76, temperature 98.8 F (37.1 C), temperature source Oral, resp. rate 16, height 6\' 2"  (1.88 m), weight 113.4 kg (250 lb), SpO2 97 %.    PHYSICAL EXAMINATION:  GENERAL:  37 y.o.-year-old patient lying in the bed with no acute distress.  EYES: Pupils equal, round, reactive to light and accommodation. No scleral icterus. Extraocular muscles intact.  HEENT: Head atraumatic, normocephalic. Oropharynx and nasopharynx clear.  NECK:  Supple, no jugular venous distention. No thyroid enlargement, no tenderness.  LUNGS:  decrease breath sounds bilateral bases , no wheezing, rales,rhonchi or crepitation. No use of accessory muscles of respiration.  CARDIOVASCULAR: S1, S2 normal. No murmurs, rubs, or gallops.  ABDOMEN: Soft, non-tender, non-distended. Bowel sounds present. No organomegaly or mass.  EXTREMITIES: No pedal edema, cyanosis, or clubbing.  NEUROLOGIC: Cranial nerves II through XII  are intact. Muscle strength 5/5 in all extremities. Sensation intact. Gait not checked.  PSYCHIATRIC: The patient is alert and oriented x 3.  SKIN: No obvious rash, lesion, or ulcer.   DATA REVIEW:   CBC Recent Labs  Lab 12/29/17 0502  WBC 2.2*  HGB 9.5*  HCT 26.4*  PLT 67*    Chemistries  Recent Labs  Lab 12/26/17 2102  12/28/17 0455  NA 132*  --   --   K 3.4*  --   --   CL 98*  --   --   CO2 21*  --   --   GLUCOSE 117*  --   --   BUN 14  --   --   CREATININE 0.89  --   --   CALCIUM 9.3  --   --   MG  --    < > 1.9  AST 117*  --   --   ALT 138*  --   --   ALKPHOS 68  --   --  BILITOT 1.0  --   --    < > = values in this interval not displayed.    Cardiac Enzymes Recent Labs  Lab 12/26/17 2102  TROPONINI <0.03    Microbiology Results  Results for orders placed or performed during the hospital encounter of 12/26/17  Blood Culture (routine x 2)     Status: None (Preliminary result)   Collection Time: 12/26/17  9:02 PM  Result Value Ref Range Status   Specimen Description BLOOD BLOOD LEFT ARM  Final   Special Requests   Final    BOTTLES DRAWN AEROBIC AND ANAEROBIC Blood Culture results may not be optimal due to an excessive volume of blood received in culture bottles   Culture   Final    NO GROWTH 3 DAYS Performed at [facility], [address], [place], [place] [address]    Report Status PENDING  Incomplete  Blood Culture (routine x 2)     Status: None (Preliminary result)   Collection Time: 12/26/17  9:02 PM  Result Value Ref Range Status   Specimen Description BLOOD BLOOD RIGHT ARM  Final   Special Requests   Final    BOTTLES DRAWN AEROBIC AND ANAEROBIC Blood Culture adequate volume   Culture   Final    NO GROWTH 3 DAYS Performed at [facility], [address], [place], [place] [address]    Report Status PENDING  Incomplete  Urine culture     Status: Abnormal   Collection Time: 12/26/17  9:02 PM  Result Value Ref Range  Status   Specimen Description   Final    URINE, RANDOM Performed at [facility], [address] [place], [place] [address]    Special Requests   Final    NONE Performed at [facility], [address], [place], [place] [address]    Culture (A)  Final    <10,000 COLONIES/mL INSIGNIFICANT GROWTH Performed at [facility], [place] [address] [place], [place] [address]    Report Status 12/28/2017 FINAL  Final  Culture, sputum-assessment     Status: None   Collection Time: 12/27/17 10:56 AM  Result Value Ref Range Status   Specimen Description Expect. Sput  Final   Special Requests NONE  Final   Sputum evaluation   Final    THIS SPECIMEN IS ACCEPTABLE FOR SPUTUM CULTURE Performed at [facility], [address] [place], [place] [address]    Report Status 12/27/2017 FINAL  Final  Culture, respiratory (NON-Expectorated)     Status: None (Preliminary result)   Collection Time: 12/27/17 10:56 AM  Result Value Ref Range Status   Specimen Description   Final    Expect. Sput Performed at [facility], [address] [place], [place] [address]    Special Requests   Final    NONE Reflexed from [id number] Performed at [facility], [place], [place] [address]    Gram Stain   Final    MODERATE WBC PRESENT,BOTH PMN AND MONONUCLEAR RARE SQUAMOUS EPITHELIAL CELLS PRESENT RARE GRAM POSITIVE COCCI IN PAIRS    Culture   Final    TOO YOUNG TO READ Performed at [facility], [place] [address] [place], [place] [address]    Report Status PENDING  Incomplete       Management plans discussed with the patient, and he is in agreement.  CODE STATUS:     Code Status Orders  (From admission, onward)  Start     Ordered   12/26/17 2326  Full code  Continuous     12/26/17 2325    Code Status History    Date Active Date Inactive Code Status Order ID Comments User Context   07/02/2017 20:34 07/05/2017 18:27 Full  Code [id number]  Harrie Foreman, MD Inpatient   05/05/2017 20:36 05/08/2017 16:43 Full Code [id number]  Vianne Bulls, MD ED      TOTAL TIME TAKING CARE OF THIS PATIENT: 35 minutes.    Loletha Grayer M.D on 12/29/2017 at 2:19 PM  Between 7am to 6pm - Pager - [phone]  After 6pm go to [email or web address] - password [email or web address]  [facility]  [phone]  CC: Primary care physician; Venia Carbon, MD

## 2017-12-29 NOTE — Progress Notes (Signed)
Ready to go home, wife coming to get him. Stable at discharge.

## 2017-12-29 NOTE — Progress Notes (Signed)
Patient ID: Mark Burns, male   DOB: September 19, 1981, 37 y.o.   [place] [facility] [place] was admitted to the Hospital on 12/26/2017 and Discharged  12/29/2017 and should be excused from work/school   for 6  days starting 12/26/2017 , may return to work/school without any restrictions.  Loletha Grayer M.D on 12/29/2017,at 9:15 AM  [place] at [facility]  [phone]

## 2017-12-30 ENCOUNTER — Other Ambulatory Visit: Payer: Self-pay | Admitting: Internal Medicine

## 2017-12-30 LAB — CULTURE, RESPIRATORY: CULTURE: NORMAL

## 2017-12-30 LAB — CULTURE, RESPIRATORY W GRAM STAIN

## 2017-12-30 MED ORDER — HYDROCODONE-ACETAMINOPHEN 5-325 MG PO TABS: 1.0000 | ORAL_TABLET | Freq: Two times a day (BID) | ORAL | 0 refills | Status: DC | PRN

## 2017-12-30 NOTE — Telephone Encounter (Signed)
Needs hospital follow up Sent D/C summary to [facility] to call him

## 2017-12-30 NOTE — Telephone Encounter (Signed)
Last filled 12-04-17 #60 Last OV 10-25-17 Next OV 01-23-18  Last UDS/CSA 01-14-17

## 2017-12-31 LAB — CULTURE, BLOOD (ROUTINE X 2)
CULTURE: NO GROWTH
Culture: NO GROWTH
SPECIAL REQUESTS: ADEQUATE

## 2017-12-31 LAB — LEGIONELLA PNEUMOPHILA SEROGP 1 UR AG: L. PNEUMOPHILA SEROGP 1 UR AG: NEGATIVE

## 2018-01-02 ENCOUNTER — Telehealth: Payer: Self-pay | Admitting: Internal Medicine

## 2018-01-02 ENCOUNTER — Inpatient Hospital Stay: Payer: BC Managed Care – PPO | Admitting: Internal Medicine

## 2018-01-02 NOTE — Telephone Encounter (Signed)
No show fee?   Copied from [place]. Topic: Quick Communication - Appointment Cancellation >> Jan 02, 2018  1:02 PM Margot Ables wrote: Patient called to cancel appointment scheduled for today 4:00pm Dr. Silvio Pate. Patient has rescheduled their appointment to 3/29 8:00am  pt called to cancel stating that his daycare is not available and he has his child today and not able to come in, advised of possible no show fee $50  Route to department's PEC pool.

## 2018-01-02 NOTE — Telephone Encounter (Signed)
No charge please. 

## 2018-01-06 ENCOUNTER — Encounter: Payer: Self-pay | Admitting: Internal Medicine

## 2018-01-07 ENCOUNTER — Ambulatory Visit (INDEPENDENT_AMBULATORY_CARE_PROVIDER_SITE_OTHER): Payer: BC Managed Care – PPO | Admitting: Internal Medicine

## 2018-01-07 ENCOUNTER — Ambulatory Visit (INDEPENDENT_AMBULATORY_CARE_PROVIDER_SITE_OTHER)
Admission: RE | Admit: 2018-01-07 | Discharge: 2018-01-07 | Disposition: A | Discharge status: Home / Self Care | Payer: BC Managed Care – PPO | Source: Ambulatory Visit | Attending: Internal Medicine | Admitting: Internal Medicine

## 2018-01-07 ENCOUNTER — Encounter: Payer: Self-pay | Admitting: Internal Medicine

## 2018-01-07 VITALS — BP 128/80 | HR 101 | Temp 98.1°F | Ht 74.0 in | Wt 249.8 lb

## 2018-01-07 DIAGNOSIS — R0781 Pleurodynia: Secondary | ICD-10-CM | POA: Diagnosis not present

## 2018-01-07 DIAGNOSIS — J13 Pneumonia due to Streptococcus pneumoniae: Secondary | ICD-10-CM | POA: Diagnosis not present

## 2018-01-07 DIAGNOSIS — G8929 Other chronic pain: Secondary | ICD-10-CM

## 2018-01-07 DIAGNOSIS — M549 Dorsalgia, unspecified: Secondary | ICD-10-CM | POA: Diagnosis not present

## 2018-01-07 MED ORDER — CYCLOBENZAPRINE HCL 10 MG PO TABS: 5.0000 mg | ORAL_TABLET | Freq: Three times a day (TID) | ORAL | 1 refills | Status: DC | PRN

## 2018-01-07 NOTE — Assessment & Plan Note (Signed)
Still using the hydrocodone regularly CSRS fine

## 2018-01-07 NOTE — Progress Notes (Signed)
Subjective:    Patient ID: Mark Burns, male    DOB: 02-04-1981, 37 y.o.   MRN: [id number]  HPI Here for hospital follow up Had sepsis and apparent pneumococcal pneumonia Reviewed hospital records Discharge 9 days ago  Sent home on augmentin--done with this Finished prednisone for the wheezing Pain started after finishing these---severe in back (lower left thoracic) Not much cough No fever and only slight chill yesterday (but it was nasty outside)  Had same sensation in hospital Flexeril did help in the hospital--but not sent home on it Did try the indomethacin---not much help  Can't take deep breath--that causes the pain (then gets panic feeling) No problems if he takes shallower breaths and stays calm Pain also with the cough  No chest pain Some palpitations No dizziness  Current Outpatient Medications on File Prior to Visit  Medication Sig Dispense Refill  . albuterol (PROVENTIL HFA;VENTOLIN HFA) 108 (90 Base) MCG/ACT inhaler Inhale 2 puffs into the lungs every 6 (six) hours as needed for wheezing or shortness of breath. 1 Inhaler 0  . DULoxetine (CYMBALTA) 30 MG capsule TAKE ONE CAPSULE BY MOUTH EVERY DAY (Patient taking differently: TAKE TWO CAPSULES BY MOUTH EVERY DAY) 30 capsule 5  . HYDROcodone-acetaminophen (NORCO/VICODIN) 5-325 MG tablet Take 1 tablet by mouth 2 (two) times daily as needed for moderate pain. 60 tablet 0  . indomethacin (INDOCIN) 50 MG capsule Take 1 capsule (50 mg total) by mouth 2 (two) times daily as needed. 60 capsule 0  . losartan (COZAAR) 100 MG tablet Take 1 tablet (100 mg total) by mouth daily. 90 tablet 3  . sildenafil (REVATIO) 20 MG tablet TAKE 3 TO 5 TABLETS DAILY AS NEEDED  2  . amoxicillin-clavulanate (AUGMENTIN) 875-125 MG tablet Take 1 tablet by mouth every 12 (twelve) hours. (Patient not taking: Reported on 01/07/2018) 10 tablet 0  . guaiFENesin-dextromethorphan (ROBITUSSIN DM) 100-10 MG/5ML syrup Take 5 mLs by mouth every 4 (four)  hours as needed for cough. (Patient not taking: Reported on 01/07/2018) 118 mL 0  . LORazepam (ATIVAN) 1 MG tablet Take 1 mg by mouth daily as needed. for anxiety  0  . polyethylene glycol (MIRALAX / GLYCOLAX) packet Take by mouth as needed.     . predniSONE (DELTASONE) 20 MG tablet 2 tabs daily for three days then stop (Patient not taking: Reported on 01/07/2018) 6 tablet 0   No current facility-administered medications on file prior to visit.     Allergies  Allergen Reactions  . Sulfa Antibiotics Hives    Past Medical History:  Diagnosis Date  . ADHD, predominantly inattentive type   . Diffuse large B cell lymphoma ([place])   . Elevated LFTs 11/19/2013  . Erectile dysfunction   . Generalized anxiety disorder   . Gout   . Hypertension   . Recurrent cold sores   . Seborrheic dermatitis     Past Surgical History:  Procedure Laterality Date  . VASECTOMY  2014    History reviewed. No pertinent family history.  Social History   Socioeconomic History  . Marital status: Married    Spouse name: Not on file  . Number of children: 2  . Years of education: Not on file  . Highest education level: Not on file  Occupational History  . Occupation: PE Pharmacist, hospital    CommentLicensed [employer] education center  Social Needs  . Financial resource strain: Not on file  . Food insecurity:    Worry: Not on file    Inability: Not  on file  . Transportation needs:    Medical: Not on file    Non-medical: Not on file  Tobacco Use  . Smoking status: Never Smoker  . Smokeless tobacco: Never Used  Substance and Sexual Activity  . Alcohol use: Yes    Comment: daily use   . Drug use: No  . Sexual activity: Not on file  Lifestyle  . Physical activity:    Days per week: Not on file    Minutes per session: Not on file  . Stress: Not on file  Relationships  . Social connections:    Talks on phone: Not on file    Gets together: Not on file    Attends religious service: Not on file    Active member of  club or organization: Not on file    Attends meetings of clubs or organizations: Not on file    Relationship status: Not on file  . Intimate partner violence:    Fear of current or ex partner: Not on file    Emotionally abused: Not on file    Physically abused: Not on file    Forced sexual activity: Not on file  Other Topics Concern  . Not on file  Social History Narrative   Played college football, married to "Judson Roch."    Review of Systems Sleeping reasonably well Appetite is improving Still feels on edge with the lymphoma ordeal    Objective:   Physical Exam  Constitutional: He appears well-developed. No distress.  Cardiovascular: Normal rate, regular rhythm and normal heart sounds. Exam reveals no gallop.  No murmur heard. Pulmonary/Chest: Effort normal and breath sounds normal. No respiratory distress. He has no wheezes. He has no rales.  Tenderness along left flank ~T11-12   Psychiatric:  Mildly anxious          Assessment & Plan:

## 2018-01-07 NOTE — Assessment & Plan Note (Signed)
Clinically better  CXR mostly resolved Not immunocompromised but did have sepsis syndrome (probably still some functional impairment) Would consider pneumovax in future--but probably doesn't need (unless the lymphoma is an issue again)

## 2018-01-07 NOTE — Assessment & Plan Note (Addendum)
Likely musculoskeletal but could be remnants of pleural inflammation from recent pneumonia Will recheck CXR to be sure nothing new CXR shows resolving pneumonia without effusion---reassuring Will Rx flexeril again for prn use

## 2018-01-10 ENCOUNTER — Inpatient Hospital Stay: Payer: BC Managed Care – PPO | Admitting: Internal Medicine

## 2018-01-21 ENCOUNTER — Encounter: Payer: Self-pay | Admitting: Internal Medicine

## 2018-01-22 MED ORDER — LORAZEPAM 1 MG PO TABS: 0.5000 mg | ORAL_TABLET | Freq: Every evening | ORAL | 0 refills | Status: DC | PRN

## 2018-01-23 ENCOUNTER — Ambulatory Visit: Payer: BC Managed Care – PPO | Admitting: Internal Medicine

## 2018-01-30 ENCOUNTER — Other Ambulatory Visit: Payer: Self-pay | Admitting: Internal Medicine

## 2018-01-30 NOTE — Telephone Encounter (Signed)
Will you address in Dr. Alla German absence?  Last rx:  12/30/17, #60 Last OV:  01/07/18 Next OV:  04/10/18

## 2018-01-31 NOTE — Telephone Encounter (Signed)
Pt calling about his refill on Norco please call pt when ready to be picked up

## 2018-02-02 MED ORDER — HYDROCODONE-ACETAMINOPHEN 5-325 MG PO TABS: 1.0000 | ORAL_TABLET | Freq: Two times a day (BID) | ORAL | 0 refills | Status: DC | PRN

## 2018-02-02 NOTE — Telephone Encounter (Signed)
Sent. Thanks.   

## 2018-02-19 ENCOUNTER — Other Ambulatory Visit: Payer: Self-pay | Admitting: Internal Medicine

## 2018-02-19 MED ORDER — LORAZEPAM 1 MG PO TABS: 0.5000 mg | ORAL_TABLET | Freq: Every evening | ORAL | 0 refills | Status: DC | PRN

## 2018-02-19 NOTE — Telephone Encounter (Signed)
Last filled 01-22-18 #30 Last OV 01-07-18 Next OV 04-10-18  Forwarding to [facility] in Dr Alla German absence

## 2018-02-21 ENCOUNTER — Encounter: Payer: Self-pay | Admitting: Internal Medicine

## 2018-02-21 DIAGNOSIS — F419 Anxiety disorder, unspecified: Secondary | ICD-10-CM

## 2018-02-26 ENCOUNTER — Other Ambulatory Visit: Payer: Self-pay | Admitting: Internal Medicine

## 2018-02-27 ENCOUNTER — Telehealth: Payer: Self-pay

## 2018-02-27 NOTE — Telephone Encounter (Signed)
Spoke to pt. He said he was having a rough day the day he saw the doctor at [facility]. He has moments of frustration with everything that is going on. He said he made the comment "Thank God I have kids because I may not be here if it wasn't for them". They took that as a mood swing to the bad. He is not suicidal at all. He is going to start seeing a counselor to help get things off his chest. He does not feel he needs to move his appt up with [place]. He appreciated [place] checking in on him and knows he can reach out to [place] if needed.

## 2018-03-05 ENCOUNTER — Other Ambulatory Visit: Payer: Self-pay | Admitting: Family Medicine

## 2018-03-05 MED ORDER — HYDROCODONE-ACETAMINOPHEN 5-325 MG PO TABS: 1.0000 | ORAL_TABLET | Freq: Two times a day (BID) | ORAL | 0 refills | Status: DC | PRN

## 2018-03-05 NOTE — Telephone Encounter (Signed)
Last filled 02-02-18 #60 Last OV 01-07-18 Next OV 04-10-18  Last UDS/CSA 01-14-17

## 2018-03-21 ENCOUNTER — Other Ambulatory Visit: Payer: Self-pay | Admitting: Internal Medicine

## 2018-03-21 MED ORDER — LORAZEPAM 1 MG PO TABS: 0.5000 mg | ORAL_TABLET | Freq: Every evening | ORAL | 0 refills | Status: DC | PRN

## 2018-03-21 NOTE — Telephone Encounter (Signed)
Duplicate Please remove---I already refilled

## 2018-03-21 NOTE — Telephone Encounter (Signed)
Ativan Last rx:  02/19/18, #30 Last OV:  01/07/18 Next OV:  04/10/18

## 2018-03-21 NOTE — Telephone Encounter (Signed)
Last filled 02-19-18 #30 Last OV Acute 01-07-18 Next OV 04-10-18

## 2018-03-25 ENCOUNTER — Ambulatory Visit (INDEPENDENT_AMBULATORY_CARE_PROVIDER_SITE_OTHER): Payer: BC Managed Care – PPO | Admitting: Psychology

## 2018-03-25 DIAGNOSIS — F410 Panic disorder [episodic paroxysmal anxiety] without agoraphobia: Secondary | ICD-10-CM

## 2018-04-02 ENCOUNTER — Other Ambulatory Visit: Payer: Self-pay | Admitting: Internal Medicine

## 2018-04-03 MED ORDER — HYDROCODONE-ACETAMINOPHEN 5-325 MG PO TABS: 1.0000 | ORAL_TABLET | Freq: Two times a day (BID) | ORAL | 0 refills | Status: DC | PRN

## 2018-04-03 NOTE — Telephone Encounter (Signed)
Last filled 03-03-18 #60 Last OV 01-07-18 Next OV 04-10-18  UDS/CSA 01-14-17 (Will do at upcoming [place])

## 2018-04-04 ENCOUNTER — Emergency Department: Payer: BC Managed Care – PPO

## 2018-04-04 ENCOUNTER — Encounter: Payer: Self-pay | Admitting: Family Medicine

## 2018-04-04 ENCOUNTER — Ambulatory Visit (INDEPENDENT_AMBULATORY_CARE_PROVIDER_SITE_OTHER): Payer: BC Managed Care – PPO | Admitting: Family Medicine

## 2018-04-04 ENCOUNTER — Encounter: Payer: Self-pay | Admitting: Emergency Medicine

## 2018-04-04 VITALS — BP 124/78 | HR 117 | Temp 98.9°F | Wt 251.2 lb

## 2018-04-04 DIAGNOSIS — Z79899 Other long term (current) drug therapy: Secondary | ICD-10-CM

## 2018-04-04 DIAGNOSIS — R300 Dysuria: Secondary | ICD-10-CM

## 2018-04-04 DIAGNOSIS — R319 Hematuria, unspecified: Secondary | ICD-10-CM

## 2018-04-04 DIAGNOSIS — N1330 Unspecified hydronephrosis: Secondary | ICD-10-CM | POA: Diagnosis present

## 2018-04-04 DIAGNOSIS — Z882 Allergy status to sulfonamides status: Secondary | ICD-10-CM

## 2018-04-04 DIAGNOSIS — R739 Hyperglycemia, unspecified: Secondary | ICD-10-CM | POA: Diagnosis present

## 2018-04-04 DIAGNOSIS — D630 Anemia in neoplastic disease: Secondary | ICD-10-CM | POA: Diagnosis present

## 2018-04-04 DIAGNOSIS — R740 Nonspecific elevation of levels of transaminase and lactic acid dehydrogenase [LDH]: Secondary | ICD-10-CM | POA: Diagnosis present

## 2018-04-04 DIAGNOSIS — R1013 Epigastric pain: Secondary | ICD-10-CM | POA: Diagnosis not present

## 2018-04-04 DIAGNOSIS — N400 Enlarged prostate without lower urinary tract symptoms: Secondary | ICD-10-CM | POA: Diagnosis present

## 2018-04-04 DIAGNOSIS — Z79891 Long term (current) use of opiate analgesic: Secondary | ICD-10-CM

## 2018-04-04 DIAGNOSIS — N139 Obstructive and reflux uropathy, unspecified: Secondary | ICD-10-CM | POA: Diagnosis present

## 2018-04-04 DIAGNOSIS — E790 Hyperuricemia without signs of inflammatory arthritis and tophaceous disease: Secondary | ICD-10-CM | POA: Diagnosis present

## 2018-04-04 DIAGNOSIS — I10 Essential (primary) hypertension: Secondary | ICD-10-CM | POA: Diagnosis present

## 2018-04-04 DIAGNOSIS — F900 Attention-deficit hyperactivity disorder, predominantly inattentive type: Secondary | ICD-10-CM | POA: Diagnosis present

## 2018-04-04 DIAGNOSIS — R188 Other ascites: Secondary | ICD-10-CM | POA: Diagnosis present

## 2018-04-04 DIAGNOSIS — Z9189 Other specified personal risk factors, not elsewhere classified: Secondary | ICD-10-CM

## 2018-04-04 DIAGNOSIS — C8333 Diffuse large B-cell lymphoma, intra-abdominal lymph nodes: Secondary | ICD-10-CM | POA: Diagnosis present

## 2018-04-04 DIAGNOSIS — F411 Generalized anxiety disorder: Secondary | ICD-10-CM | POA: Diagnosis present

## 2018-04-04 DIAGNOSIS — N179 Acute kidney failure, unspecified: Principal | ICD-10-CM | POA: Diagnosis present

## 2018-04-04 DIAGNOSIS — E876 Hypokalemia: Secondary | ICD-10-CM | POA: Diagnosis present

## 2018-04-04 DIAGNOSIS — R1084 Generalized abdominal pain: Secondary | ICD-10-CM | POA: Diagnosis not present

## 2018-04-04 DIAGNOSIS — Z9852 Vasectomy status: Secondary | ICD-10-CM

## 2018-04-04 DIAGNOSIS — C800 Disseminated malignant neoplasm, unspecified: Secondary | ICD-10-CM | POA: Diagnosis present

## 2018-04-04 DIAGNOSIS — Z936 Other artificial openings of urinary tract status: Secondary | ICD-10-CM

## 2018-04-04 LAB — CBC WITH DIFFERENTIAL/PLATELET
BASOS ABS: 0 10*3/uL (ref 0.0–0.1)
BASOS ABS: 0 10*3/uL (ref 0–0.1)
BASOS PCT: 1 %
Basophils Relative: 0.5 % (ref 0.0–3.0)
EOS ABS: 0.1 10*3/uL (ref 0–0.7)
Eosinophils Absolute: 0.1 10*3/uL (ref 0.0–0.7)
Eosinophils Relative: 1.2 % (ref 0.0–5.0)
Eosinophils Relative: 2 %
HCT: 36.5 % — ABNORMAL LOW (ref 39.0–52.0)
HCT: 35.7 % — ABNORMAL LOW (ref 40.0–52.0)
HEMOGLOBIN: 13 g/dL (ref 13.0–17.0)
HEMOGLOBIN: 12.9 g/dL — AB (ref 13.0–18.0)
LYMPHS ABS: 0.4 10*3/uL — AB (ref 0.7–4.0)
Lymphocytes Relative: 7.3 % — ABNORMAL LOW (ref 12.0–46.0)
Lymphocytes Relative: 7 %
Lymphs Abs: 0.4 10*3/uL — ABNORMAL LOW (ref 1.0–3.6)
MCH: 34 pg (ref 26.0–34.0)
MCHC: 35.6 g/dL (ref 30.0–36.0)
MCHC: 36.2 g/dL — ABNORMAL HIGH (ref 32.0–36.0)
MCV: 94 fl (ref 78.0–100.0)
MCV: 94.1 fL (ref 80.0–100.0)
MONO ABS: 0.7 10*3/uL (ref 0.1–1.0)
Monocytes Absolute: 0.6 10*3/uL (ref 0.2–1.0)
Monocytes Relative: 14 % — ABNORMAL HIGH (ref 3.0–12.0)
Monocytes Relative: 12 %
NEUTROS PCT: 77 % (ref 43.0–77.0)
NEUTROS PCT: 78 %
Neutro Abs: 3.8 10*3/uL (ref 1.4–7.7)
Neutro Abs: 4.1 10*3/uL (ref 1.4–6.5)
PLATELETS: 251 10*3/uL (ref 150–440)
Platelets: 245 10*3/uL (ref 150.0–400.0)
RBC: 3.88 Mil/uL — AB (ref 4.22–5.81)
RBC: 3.8 MIL/uL — AB (ref 4.40–5.90)
RDW: 13 % (ref 11.5–15.5)
RDW: 13.3 % (ref 11.5–14.5)
WBC: 5 10*3/uL (ref 4.0–10.5)
WBC: 5.2 10*3/uL (ref 3.8–10.6)

## 2018-04-04 LAB — POC URINALSYSI DIPSTICK (AUTOMATED)
Bilirubin, UA: NEGATIVE
GLUCOSE UA: NEGATIVE
Ketones, UA: NEGATIVE
Leukocytes, UA: NEGATIVE
Nitrite, UA: NEGATIVE
PH UA: 6 (ref 5.0–8.0)
Protein, UA: POSITIVE — AB
RBC UA: POSITIVE
SPEC GRAV UA: 1.015 (ref 1.010–1.025)
Urobilinogen, UA: 0.2 E.U./dL

## 2018-04-04 LAB — COMPREHENSIVE METABOLIC PANEL
ALBUMIN: 4.5 g/dL (ref 3.5–5.2)
ALT: 34 U/L (ref 0–53)
AST: 38 U/L — AB (ref 0–37)
Alkaline Phosphatase: 75 U/L (ref 39–117)
BILIRUBIN TOTAL: 0.8 mg/dL (ref 0.2–1.2)
BUN: 21 mg/dL (ref 6–23)
CO2: 28 mEq/L (ref 19–32)
CREATININE: 2.36 mg/dL — AB (ref 0.40–1.50)
Calcium: 9.7 mg/dL (ref 8.4–10.5)
Chloride: 99 mEq/L (ref 96–112)
GFR: 32.86 mL/min — ABNORMAL LOW (ref >60.00)
Glucose, Bld: 105 mg/dL — ABNORMAL HIGH (ref 70–99)
Potassium: 3.9 mEq/L (ref 3.5–5.1)
Sodium: 138 mEq/L (ref 135–145)
TOTAL PROTEIN: 6.9 g/dL (ref 6.0–8.3)

## 2018-04-04 LAB — LIPASE: LIPASE: 16 U/L (ref 11.0–59.0)

## 2018-04-04 LAB — URINALYSIS, MICROSCOPIC ONLY

## 2018-04-04 MED ORDER — FENTANYL CITRATE (PF) 100 MCG/2ML IJ SOLN
50.0000 ug | INTRAMUSCULAR | Status: DC | PRN
  Administered 2018-04-04: 50 ug via INTRAVENOUS
  Filled 2018-04-04: qty 2

## 2018-04-04 NOTE — Patient Instructions (Signed)
Take 2 doses of miralax tonight- one in the evening, one before bed. If you do not have a good bowel movement by tomorrow afternoon, repeat.   If worsening symptoms- fever, vomiting, increased pain please go to er

## 2018-04-04 NOTE — ED Notes (Signed)
Patient placed in subwait after receiving medication.

## 2018-04-04 NOTE — ED Notes (Signed)
Patient transported to Ultrasound 

## 2018-04-04 NOTE — Progress Notes (Signed)
Subjective:    Patient ID: Mark Burns, male    DOB: Apr 19, 1981, 37 y.o.   MRN: [id number]  HPI This is a 37 yo male who presents today with abdominal pain. Started about 5 days ago, upper part of abdomen, went away for a couple of days then came back a couple of days later. No fever, feels "rough," a little achy, mild nausea, no vomiting. No pain, feels full, constant. Last BM last night, small amount, some straining, BM every day.  No back pain.  Has pressure with urination and decreased urination. No flank pain.   Past Medical History:  Diagnosis Date  . ADHD, predominantly inattentive type   . Diffuse large B cell lymphoma ([place])   . Elevated LFTs 11/19/2013  . Erectile dysfunction   . Generalized anxiety disorder   . Gout   . Hypertension   . Recurrent cold sores   . Seborrheic dermatitis    Past Surgical History:  Procedure Laterality Date  . VASECTOMY  2014   No family history on file. Social History   Tobacco Use  . Smoking status: Never Smoker  . Smokeless tobacco: Never Used  Substance Use Topics  . Alcohol use: Yes    Comment: daily use   . Drug use: No      Review of Systems Per HPI    Objective:   Physical Exam  Constitutional: He is oriented to person, place, and time. He appears well-developed and well-nourished.  Non-toxic appearance. He does not appear ill. No distress.  HENT:  Head: Normocephalic and atraumatic.  Cardiovascular: Normal rate, regular rhythm and normal heart sounds.  Pulmonary/Chest: Effort normal and breath sounds normal.  Abdominal: Soft. Normal appearance and bowel sounds are normal. He exhibits no distension and no mass. There is tenderness (mild) in the epigastric area.  Neurological: He is alert and oriented to person, place, and time.  Skin: Skin is warm and dry.  Psychiatric: He has a normal mood and affect. His behavior is normal.      BP 124/78   Pulse (!) 117   Temp 98.9 F (37.2 C) (Oral)   Wt 251 lb 4 oz (114  kg)   SpO2 99%   BMI 32.26 kg/m  Wt Readings from Last 3 Encounters:  04/04/18 251 lb 4 oz (114 kg)  01/07/18 249 lb 12 oz (113.3 kg)  12/26/17 250 lb (113.4 kg)   Results for orders placed or performed in visit on 04/04/18  POCT Urinalysis Dipstick (Automated)  Result Value Ref Range   Color, UA yellow    Clarity, UA clear    Glucose, UA Negative Negative   Bilirubin, UA neg    Ketones, UA neg    Spec Grav, UA 1.015 1.010 - 1.025   Blood, UA pos    pH, UA 6.0 5.0 - 8.0   Protein, UA Positive (A) Negative   Urobilinogen, UA 0.2 0.2 or 1.0 E.U./dL   Nitrite, UA neg    Leukocytes, UA Negative Negative       Assessment & Plan:  1. Dysuria - no leuk/nit, will check micro - POCT Urinalysis Dipstick (Automated) - Urine Microscopic  2. Hematuria, unspecified type - Urine Microscopic  3. Epigastric pain - some recent constipation, provided him instructions for Miralax, will check labs and notify him of results - RTC/ER if worsening pain, fever/chill, vomiting - CBC with Differential - Comprehensive metabolic panel - Lipase   Clarene Reamer, FNP-BC  [facility] at [facility],  [facility]  04/05/2018 6:50 AM

## 2018-04-04 NOTE — ED Notes (Signed)
Patient given an update of plan of care. Patient verbalizes understanding.

## 2018-04-04 NOTE — ED Notes (Signed)
Patient given update on plan of care. Patient verbalizes understanding.  

## 2018-04-04 NOTE — ED Notes (Signed)
Patient transported to CT 

## 2018-04-04 NOTE — ED Notes (Signed)
Spoke with Dr. Kerman Passey regarding patient. New order received for renal US.

## 2018-04-04 NOTE — ED Triage Notes (Signed)
Pt states he was at PCP today and was called and told his kidneys were functioning at 30% and told to come here.  Pt states he is having bilateral back pain and has been feeling like he has been unable to empty on void.

## 2018-04-05 ENCOUNTER — Encounter: Payer: Self-pay | Admitting: Internal Medicine

## 2018-04-05 ENCOUNTER — Encounter: Payer: Self-pay | Admitting: Family Medicine

## 2018-04-05 ENCOUNTER — Emergency Department: Payer: BC Managed Care – PPO

## 2018-04-05 ENCOUNTER — Inpatient Hospital Stay
Admission: EM | Admit: 2018-04-05 | Discharge: 2018-04-12 | DRG: 683 | Disposition: A | Discharge status: Other Acute Inpatient Hospital | Payer: BC Managed Care – PPO | Attending: Internal Medicine | Admitting: Internal Medicine

## 2018-04-05 ENCOUNTER — Other Ambulatory Visit: Payer: Self-pay

## 2018-04-05 DIAGNOSIS — I10 Essential (primary) hypertension: Secondary | ICD-10-CM | POA: Diagnosis present

## 2018-04-05 DIAGNOSIS — N179 Acute kidney failure, unspecified: Secondary | ICD-10-CM | POA: Diagnosis present

## 2018-04-05 DIAGNOSIS — R1900 Intra-abdominal and pelvic swelling, mass and lump, unspecified site: Secondary | ICD-10-CM | POA: Diagnosis not present

## 2018-04-05 DIAGNOSIS — R1901 Right upper quadrant abdominal swelling, mass and lump: Secondary | ICD-10-CM | POA: Diagnosis not present

## 2018-04-05 DIAGNOSIS — N1330 Unspecified hydronephrosis: Secondary | ICD-10-CM | POA: Diagnosis present

## 2018-04-05 DIAGNOSIS — R140 Abdominal distension (gaseous): Secondary | ICD-10-CM | POA: Diagnosis not present

## 2018-04-05 DIAGNOSIS — Z79891 Long term (current) use of opiate analgesic: Secondary | ICD-10-CM | POA: Diagnosis not present

## 2018-04-05 DIAGNOSIS — E790 Hyperuricemia without signs of inflammatory arthritis and tophaceous disease: Secondary | ICD-10-CM

## 2018-04-05 DIAGNOSIS — R338 Other retention of urine: Secondary | ICD-10-CM | POA: Diagnosis not present

## 2018-04-05 DIAGNOSIS — C800 Disseminated malignant neoplasm, unspecified: Secondary | ICD-10-CM | POA: Diagnosis present

## 2018-04-05 DIAGNOSIS — N400 Enlarged prostate without lower urinary tract symptoms: Secondary | ICD-10-CM

## 2018-04-05 DIAGNOSIS — Z936 Other artificial openings of urinary tract status: Secondary | ICD-10-CM | POA: Diagnosis not present

## 2018-04-05 DIAGNOSIS — R740 Nonspecific elevation of levels of transaminase and lactic acid dehydrogenase [LDH]: Secondary | ICD-10-CM | POA: Diagnosis present

## 2018-04-05 DIAGNOSIS — Z9221 Personal history of antineoplastic chemotherapy: Secondary | ICD-10-CM

## 2018-04-05 DIAGNOSIS — C8593 Non-Hodgkin lymphoma, unspecified, intra-abdominal lymph nodes: Secondary | ICD-10-CM

## 2018-04-05 DIAGNOSIS — R739 Hyperglycemia, unspecified: Secondary | ICD-10-CM | POA: Diagnosis present

## 2018-04-05 DIAGNOSIS — C8513 Unspecified B-cell lymphoma, intra-abdominal lymph nodes: Secondary | ICD-10-CM | POA: Diagnosis not present

## 2018-04-05 DIAGNOSIS — Z9189 Other specified personal risk factors, not elsewhere classified: Secondary | ICD-10-CM | POA: Diagnosis not present

## 2018-04-05 DIAGNOSIS — Z9852 Vasectomy status: Secondary | ICD-10-CM | POA: Diagnosis not present

## 2018-04-05 DIAGNOSIS — K828 Other specified diseases of gallbladder: Secondary | ICD-10-CM

## 2018-04-05 DIAGNOSIS — D630 Anemia in neoplastic disease: Secondary | ICD-10-CM | POA: Diagnosis present

## 2018-04-05 DIAGNOSIS — C8333 Diffuse large B-cell lymphoma, intra-abdominal lymph nodes: Secondary | ICD-10-CM | POA: Diagnosis present

## 2018-04-05 DIAGNOSIS — R188 Other ascites: Secondary | ICD-10-CM | POA: Diagnosis present

## 2018-04-05 DIAGNOSIS — R1084 Generalized abdominal pain: Secondary | ICD-10-CM

## 2018-04-05 DIAGNOSIS — E876 Hypokalemia: Secondary | ICD-10-CM | POA: Diagnosis present

## 2018-04-05 DIAGNOSIS — F900 Attention-deficit hyperactivity disorder, predominantly inattentive type: Secondary | ICD-10-CM | POA: Diagnosis present

## 2018-04-05 DIAGNOSIS — N401 Enlarged prostate with lower urinary tract symptoms: Secondary | ICD-10-CM | POA: Diagnosis not present

## 2018-04-05 DIAGNOSIS — Z882 Allergy status to sulfonamides status: Secondary | ICD-10-CM | POA: Diagnosis not present

## 2018-04-05 DIAGNOSIS — F411 Generalized anxiety disorder: Secondary | ICD-10-CM | POA: Diagnosis present

## 2018-04-05 DIAGNOSIS — N139 Obstructive and reflux uropathy, unspecified: Secondary | ICD-10-CM | POA: Diagnosis present

## 2018-04-05 DIAGNOSIS — Z79899 Other long term (current) drug therapy: Secondary | ICD-10-CM | POA: Diagnosis not present

## 2018-04-05 LAB — URIC ACID: URIC ACID, SERUM: 13.5 mg/dL — AB (ref 4.4–7.6)

## 2018-04-05 LAB — BASIC METABOLIC PANEL
Anion gap: 14 (ref 5–15)
BUN: 22 mg/dL — ABNORMAL HIGH (ref 6–20)
CALCIUM: 9.2 mg/dL (ref 8.9–10.3)
CHLORIDE: 101 mmol/L (ref 101–111)
CO2: 23 mmol/L (ref 22–32)
Creatinine, Ser: 2.21 mg/dL — ABNORMAL HIGH (ref 0.61–1.24)
GFR calc non Af Amer: 36 mL/min — ABNORMAL LOW (ref >60)
GFR, EST AFRICAN AMERICAN: 42 mL/min — AB (ref >60)
Glucose, Bld: 122 mg/dL — ABNORMAL HIGH (ref 65–99)
Potassium: 3.4 mmol/L — ABNORMAL LOW (ref 3.5–5.1)
Sodium: 138 mmol/L (ref 135–145)

## 2018-04-05 LAB — URINALYSIS, COMPLETE (UACMP) WITH MICROSCOPIC
Bilirubin Urine: NEGATIVE
Glucose, UA: NEGATIVE mg/dL
Ketones, ur: NEGATIVE mg/dL
LEUKOCYTES UA: NEGATIVE
Nitrite: NEGATIVE
PH: 5 (ref 5.0–8.0)
PROTEIN: 30 mg/dL — AB
Specific Gravity, Urine: 1.013 (ref 1.005–1.030)
Squamous Epithelial / LPF: NONE SEEN (ref 0–5)

## 2018-04-05 LAB — HEPATIC FUNCTION PANEL
ALK PHOS: 68 U/L (ref 38–126)
ALT: 37 U/L (ref 17–63)
AST: 50 U/L — ABNORMAL HIGH (ref 15–41)
Albumin: 4.4 g/dL (ref 3.5–5.0)
BILIRUBIN DIRECT: 0.2 mg/dL (ref 0.1–0.5)
BILIRUBIN INDIRECT: 0.7 mg/dL (ref 0.3–0.9)
BILIRUBIN TOTAL: 0.9 mg/dL (ref 0.3–1.2)
Total Protein: 7.1 g/dL (ref 6.5–8.1)

## 2018-04-05 LAB — APTT: aPTT: 29 seconds (ref 24–36)

## 2018-04-05 LAB — COMPREHENSIVE METABOLIC PANEL
ALK PHOS: 66 U/L (ref 38–126)
ALT: 34 U/L (ref 17–63)
AST: 44 U/L — ABNORMAL HIGH (ref 15–41)
Albumin: 3.9 g/dL (ref 3.5–5.0)
Anion gap: 10 (ref 5–15)
BILIRUBIN TOTAL: 0.9 mg/dL (ref 0.3–1.2)
BUN: 20 mg/dL (ref 6–20)
CALCIUM: 8.2 mg/dL — AB (ref 8.9–10.3)
CO2: 21 mmol/L — ABNORMAL LOW (ref 22–32)
CREATININE: 1.71 mg/dL — AB (ref 0.61–1.24)
Chloride: 105 mmol/L (ref 101–111)
GFR calc Af Amer: 57 mL/min — ABNORMAL LOW (ref >60)
GFR, EST NON AFRICAN AMERICAN: 49 mL/min — AB (ref >60)
GLUCOSE: 87 mg/dL (ref 65–99)
Potassium: 3.9 mmol/L (ref 3.5–5.1)
Sodium: 136 mmol/L (ref 135–145)
TOTAL PROTEIN: 6 g/dL — AB (ref 6.5–8.1)

## 2018-04-05 LAB — PROTIME-INR
INR: 1.1
PROTHROMBIN TIME: 14.1 s (ref 11.4–15.2)

## 2018-04-05 LAB — PHOSPHORUS: Phosphorus: 4.7 mg/dL — ABNORMAL HIGH (ref 2.5–4.6)

## 2018-04-05 LAB — LIPASE, BLOOD: Lipase: 30 U/L (ref 11–51)

## 2018-04-05 LAB — LACTATE DEHYDROGENASE: LDH: 660 U/L — ABNORMAL HIGH (ref 98–192)

## 2018-04-05 LAB — MAGNESIUM: Magnesium: 1.5 mg/dL — ABNORMAL LOW (ref 1.7–2.4)

## 2018-04-05 LAB — CK: Total CK: 70 U/L (ref 49–397)

## 2018-04-05 MED ORDER — MORPHINE SULFATE (PF) 4 MG/ML IV SOLN
INTRAVENOUS | Status: AC
  Filled 2018-04-05: qty 1

## 2018-04-05 MED ORDER — LORAZEPAM 0.5 MG PO TABS
0.5000 mg | ORAL_TABLET | Freq: Every evening | ORAL | Status: DC | PRN
  Administered 2018-04-07: 1 mg via ORAL
  Filled 2018-04-05: qty 2

## 2018-04-05 MED ORDER — SODIUM CHLORIDE 0.9 % IV SOLN
INTRAVENOUS | Status: DC
  Administered 2018-04-05 – 2018-04-10 (×13): via INTRAVENOUS
  Administered 2018-04-10: 100 mL/h via INTRAVENOUS
  Administered 2018-04-11: 19:00:00 via INTRAVENOUS
  Administered 2018-04-12: 100 mL/h via INTRAVENOUS

## 2018-04-05 MED ORDER — ONDANSETRON HCL 4 MG PO TABS
4.0000 mg | ORAL_TABLET | Freq: Four times a day (QID) | ORAL | Status: DC | PRN
  Administered 2018-04-06: 4 mg via ORAL
  Filled 2018-04-05: qty 1

## 2018-04-05 MED ORDER — HEPARIN SODIUM (PORCINE) 5000 UNIT/ML IJ SOLN
5000.0000 [IU] | Freq: Three times a day (TID) | INTRAMUSCULAR | Status: DC
  Administered 2018-04-05 – 2018-04-12 (×22): 5000 [IU] via SUBCUTANEOUS
  Filled 2018-04-05 (×22): qty 1

## 2018-04-05 MED ORDER — SODIUM CHLORIDE 0.9 % IV BOLUS
1000.0000 mL | INTRAVENOUS | Status: AC
  Administered 2018-04-05: 1000 mL via INTRAVENOUS

## 2018-04-05 MED ORDER — ACETAMINOPHEN 325 MG PO TABS
650.0000 mg | ORAL_TABLET | Freq: Four times a day (QID) | ORAL | Status: DC | PRN
Start: 2018-04-05 — End: 2018-04-12
  Administered 2018-04-08 – 2018-04-09 (×2): 650 mg via ORAL
  Filled 2018-04-05 (×2): qty 2

## 2018-04-05 MED ORDER — ONDANSETRON HCL 4 MG/2ML IJ SOLN
4.0000 mg | Freq: Once | INTRAMUSCULAR | Status: AC
  Administered 2018-04-05: 4 mg via INTRAVENOUS

## 2018-04-05 MED ORDER — MAGNESIUM SULFATE 2 GM/50ML IV SOLN
2.0000 g | Freq: Once | INTRAVENOUS | Status: AC
  Administered 2018-04-05: 2 g via INTRAVENOUS
  Filled 2018-04-05: qty 50

## 2018-04-05 MED ORDER — ALLOPURINOL 300 MG PO TABS
150.0000 mg | ORAL_TABLET | Freq: Every day | ORAL | Status: DC
  Administered 2018-04-05 – 2018-04-06 (×2): 150 mg via ORAL
  Filled 2018-04-05 (×2): qty 0.5

## 2018-04-05 MED ORDER — BISACODYL 5 MG PO TBEC: 5.0000 mg | DELAYED_RELEASE_TABLET | Freq: Every day | ORAL | Status: DC | PRN

## 2018-04-05 MED ORDER — MORPHINE SULFATE (PF) 2 MG/ML IV SOLN
2.0000 mg | INTRAVENOUS | Status: DC | PRN
Start: 2018-04-05 — End: 2018-04-06
  Administered 2018-04-05 (×5): 2 mg via INTRAVENOUS
  Filled 2018-04-05 (×7): qty 1

## 2018-04-05 MED ORDER — MORPHINE SULFATE (PF) 4 MG/ML IV SOLN
4.0000 mg | INTRAVENOUS | Status: DC | PRN
  Administered 2018-04-05 – 2018-04-06 (×3): 4 mg via INTRAVENOUS
  Filled 2018-04-05 (×3): qty 1

## 2018-04-05 MED ORDER — MORPHINE SULFATE (PF) 4 MG/ML IV SOLN
4.0000 mg | Freq: Once | INTRAVENOUS | Status: AC
  Administered 2018-04-05: 4 mg via INTRAVENOUS

## 2018-04-05 MED ORDER — SENNOSIDES-DOCUSATE SODIUM 8.6-50 MG PO TABS: 1.0000 | ORAL_TABLET | Freq: Every evening | ORAL | Status: DC | PRN

## 2018-04-05 MED ORDER — ONDANSETRON HCL 4 MG/2ML IJ SOLN
4.0000 mg | Freq: Four times a day (QID) | INTRAMUSCULAR | Status: DC | PRN
  Administered 2018-04-10 – 2018-04-12 (×5): 4 mg via INTRAVENOUS
  Filled 2018-04-05 (×5): qty 2

## 2018-04-05 MED ORDER — ACETAMINOPHEN 650 MG RE SUPP: 650.0000 mg | Freq: Four times a day (QID) | RECTAL | Status: DC | PRN

## 2018-04-05 MED ORDER — ONDANSETRON HCL 4 MG/2ML IJ SOLN
INTRAMUSCULAR | Status: AC
  Administered 2018-04-05: 4 mg via INTRAVENOUS
  Filled 2018-04-05: qty 2

## 2018-04-05 MED ORDER — DULOXETINE HCL 60 MG PO CPEP
90.0000 mg | ORAL_CAPSULE | Freq: Every day | ORAL | Status: DC
  Administered 2018-04-05 – 2018-04-12 (×7): 90 mg via ORAL
  Filled 2018-04-05 (×8): qty 1

## 2018-04-05 MED ORDER — MORPHINE SULFATE (PF) 4 MG/ML IV SOLN
INTRAVENOUS | Status: AC
  Administered 2018-04-05: 4 mg via INTRAVENOUS
  Filled 2018-04-05: qty 1

## 2018-04-05 MED ORDER — POTASSIUM CHLORIDE 10 MEQ/100ML IV SOLN
10.0000 meq | INTRAVENOUS | Status: AC
  Administered 2018-04-05 (×4): 10 meq via INTRAVENOUS
  Filled 2018-04-05 (×3): qty 100

## 2018-04-05 NOTE — ED Notes (Signed)
ED Provider at bedside. 

## 2018-04-05 NOTE — ED Notes (Signed)
Patient transported to Ultrasound 

## 2018-04-05 NOTE — Progress Notes (Signed)
[facility] [place] NAME: Mark Burns    MR#:  [id number]  DATE OF BIRTH:  1980-10-22  SUBJECTIVE:  CHIEF COMPLAINT:    REVIEW OF SYSTEMS:  CONSTITUTIONAL: No fever, fatigue or weakness.  EYES: No blurred or double vision.  EARS, NOSE, AND THROAT: No tinnitus or ear pain.  RESPIRATORY: No cough, shortness of breath, wheezing or hemoptysis.  CARDIOVASCULAR: No chest pain, orthopnea, edema.  GASTROINTESTINAL: No nausea, vomiting, diarrhea or abdominal pain.  GENITOURINARY: No dysuria, hematuria.  ENDOCRINE: No polyuria, nocturia,  HEMATOLOGY: No anemia, easy bruising or bleeding SKIN: No rash or lesion. MUSCULOSKELETAL: No joint pain or arthritis.   NEUROLOGIC: No tingling, numbness, weakness.  PSYCHIATRY: No anxiety or depression.   DRUG ALLERGIES:   Allergies  Allergen Reactions  . Sulfa Antibiotics Hives    VITALS:  Blood pressure (!) 130/92, pulse 93, temperature 98.5 F (36.9 C), resp. rate 20, height 6\' 2"  (1.88 m), weight 113.9 kg (251 lb), SpO2 99 %.  PHYSICAL EXAMINATION:  GENERAL:  37 y.o.-year-old patient lying in the bed with no acute distress.  EYES: Pupils equal, round, reactive to light and accommodation. No scleral icterus. Extraocular muscles intact.  HEENT: Head atraumatic, normocephalic. Oropharynx and nasopharynx clear.  NECK:  Supple, no jugular venous distention. No thyroid enlargement, no tenderness.  LUNGS: Normal breath sounds bilaterally, no wheezing, rales,rhonchi or crepitation. No use of accessory muscles of respiration.  CARDIOVASCULAR: S1, S2 normal. No murmurs, rubs, or gallops.  ABDOMEN: Soft, nontender, nondistended. Bowel sounds present. No organomegaly or mass.  EXTREMITIES: No pedal edema, cyanosis, or clubbing.  NEUROLOGIC: Cranial nerves II through XII are intact. Muscle strength 5/5 in all extremities. Sensation intact. Gait not checked.  PSYCHIATRIC: The patient is alert and  oriented x 3.  SKIN: No obvious rash, lesion, or ulcer.    LABORATORY PANEL:   CBC Recent Labs  Lab 04/04/18 2320  WBC 5.2  HGB 12.9*  HCT 35.7*  PLT 251   ------------------------------------------------------------------------------------------------------------------  Chemistries  Recent Labs  Lab 04/04/18 2320  NA 138  K 3.4*  CL 101  CO2 23  GLUCOSE 122*  BUN 22*  CREATININE 2.21*  CALCIUM 9.2  MG 1.5*  AST 50*  ALT 37  ALKPHOS 68  BILITOT 0.9   ------------------------------------------------------------------------------------------------------------------  Cardiac Enzymes No results for input(s): TROPONINI in the last 168 hours. ------------------------------------------------------------------------------------------------------------------  RADIOLOGY:  US Renal  Result Date: 04/04/2018 CLINICAL DATA:  38 year old male with acute renal insufficiency. History of B-cell lymphoma. EXAM: RENAL / URINARY TRACT ULTRASOUND COMPLETE COMPARISON:  CT Abdomen and Pelvis 12/27/2017. FINDINGS: Right Kidney: Length: 13.9 centimeters. Echogenicity within normal limits. No mass or hydronephrosis visualized. Left Kidney: Length: 13.5 centimeters. There is mild hydronephrosis (image 31) which is new. Otherwise normal ultrasound appearance of the left kidney. Postvoid images of the left kidney appear unchanged (image 56). Bladder: Small bladder volume. Nonspecific mild to moderate bladder wall thickening (image 51). No urinary debris. Other findings: Trace free fluid about the liver, Morrison's pouch (image 6). IMPRESSION: 1. Mild left hydronephrosis is new since 12/27/2017, etiology unclear. There is mild to moderate nonspecific bladder wall thickening. 2. The right kidney appears normal. 3. Small volume of nonspecific perihepatic free fluid/ascites. Electronically Signed   By: Genevie Ann M.D.   On: 04/04/2018 22:53   Ct Renal Stone Study  Result Date: 04/05/2018 CLINICAL DATA:   Abdominal pain pressure with urination, decreased urination EXAM: CT ABDOMEN AND PELVIS WITHOUT CONTRAST  TECHNIQUE: Multidetector CT imaging of the abdomen and pelvis was performed following the standard protocol without IV contrast. COMPARISON:  Ultrasound 04/04/2018, CT 12/27/2017 FINDINGS: Lower chest: Lung bases demonstrate calcified left lower lobe granuloma. Calcified left infrahilar lymph nodes. Heart size within normal limits. Trace right pleural effusion. Hepatobiliary: No focal hepatic abnormality. Negative for biliary enlargement. Significant wall thickening of the gallbladder. No calcified stones. Masslike area measuring 5.5 x 4.1 cm at the falciform ligament. Pancreas: Unremarkable. No pancreatic ductal dilatation or surrounding inflammatory changes. Spleen: No focal abnormality.  Slightly enlarged at 15 cm. Adrenals/Urinary Tract: Adrenal glands are within normal limits. Mild left hydronephrosis. No ureteral stone. Diffuse bladder wall thickening with nodularity along the anterior wall on the left side. Stomach/Bowel: The stomach is nonenlarged. No dilated small bowel. No colon wall thickening. Vascular/Lymphatic: Mild aortic atherosclerosis. No aneurysmal dilatation. Slight increased size portal caval node measuring 22 mm compared with 15 mm previously. Reproductive: Interval marked enlargement of the prostate gland, now measuring 9.3 cm transverse by 6.3 cm AP. Other: No free air. Small amount of ascites within the abdomen and pelvis. Fluid infiltration along the mesentery. Diffuse nodular thickening of the anterior peritoneal cavity. Small amount of ascites around the liver and spleen. Suspected mass in the right upper quadrant of the abdomen measuring 7.5 by 5.8 cm. Musculoskeletal: No acute or suspicious abnormality. IMPRESSION: 1. Mild left hydronephrosis and hydroureter, but no ureteral stone visible. 2. Interim development of diffuse infiltration and thickening of the peritoneal cavity,  appearance of which suggests carcinomatosis or lymphomatosis, given history of prior lymphoma. Development of masslike region along the falciform ligament, in addition to a 7.5 cm right upper quadrant intraperitoneal mass, also suspicious for metastatic disease/possible lymphoma recurrence. 3. Diffuse gallbladder wall thickening, can also be seen in the setting of lymphoma. Cholecystitis may also be considered. 4. Interval massive enlargement of the prostate, can also be seen in the setting of lymphoma. 5. Diffuse bladder wall thickening with masslike appearance along the left anterior wall. 6. Small right pleural effusion. Electronically Signed   By: Donavan Foil M.D.   On: 04/05/2018 00:34   US Abdomen Limited Ruq  Result Date: 04/05/2018 CLINICAL DATA:  Gallbladder wall thickening on CT. EXAM: ULTRASOUND ABDOMEN LIMITED RIGHT UPPER QUADRANT COMPARISON:  CT abdomen pelvis 04/04/2018 FINDINGS: Gallbladder: No gallstones are demonstrated. There is diffuse thickening and edema of the gallbladder wall with gallbladder wall thickness measuring up to 9 mm. Echogenic appearance of the bile suggesting sludge. Gallbladder wall thickening is nonspecific and could indicate acalculous cholecystitis, lymphoma, or may be related to liver disease, heart disease, or hypoproteinemia. Common bile duct: Diameter: 3 mm, normal Liver: No focal lesion identified. Within normal limits in parenchymal echogenicity. The mass like structure seen inferior to the liver is not identified specifically sonographically. Small amount of free fluid demonstrated around the liver. Portal vein is patent on color Doppler imaging with normal direction of blood flow towards the liver. IMPRESSION: 1. Prominent diffuse thickening of the gallbladder wall with edema and gallbladder sludge. No stones identified. This appearance is nonspecific in could indicate acalculous cholecystitis, lymphoma, or changes due to liver disease, heart disease, or  hypoproteinemia. 2. Small amount of free fluid around the liver. 3. Mass adjacent to the liver seen at CT is not identified sonographically for correlation. Electronically Signed   By: Lucienne Capers M.D.   On: 04/05/2018 02:37    EKG:   Orders placed or performed during the hospital encounter of 12/26/17  .  ED EKG 12-Lead  . ED EKG 12-Lead  . EKG 12-Lead  . EKG 12-Lead  . EKG    ASSESSMENT AND PLAN:     #Acute kidney injury-postrenal-with left-sided mild hydronephrosis and hydroureter probably from  prostate enlargement IV fluids, avoid nephrotoxins, Foley catheter Urology consult placed Repeat a.m. 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 Management plans discussed with the patient, family and they are in agreement.  CODE STATUS: fc   TOTAL TIME TAKING CARE OF THIS PATIENT: 36 minutes.   POSSIBLE D/C IN 1-2  DAYS, DEPENDING ON CLINICAL CONDITION.  Note: This dictation was prepared with Dragon dictation along with smaller phrase technology. Any transcriptional errors that result from this process are unintentional.   Nicholes Mango M.D on 04/05/2018 at 2:25 PM  Between 7am to 6pm - Pager - [phone] After 6pm go to [email or web address] - password [email or web address] [facility]  Office  [phone]  CC: Primary care physician; Venia Carbon, MD

## 2018-04-05 NOTE — ED Notes (Signed)
Admission MD at bedside.  

## 2018-04-05 NOTE — H&P (Signed)
[facility] [place] NAME: Mark Burns    MR#:  [id number]  DATE OF BIRTH:  1981/01/28  DATE OF ADMISSION:  04/05/2018  PRIMARY CARE PHYSICIAN: Venia Carbon, MD   REQUESTING/REFERRING PHYSICIAN: Hinda Kehr, MD  CHIEF COMPLAINT:   Chief Complaint  Patient presents with  . Flank Pain    Bilateral    HISTORY OF PRESENT ILLNESS:  Mark Burns  is a 37 y.o. male with a known history of high-grade B-cell lymphoma, in remission, who p/w 1wk Hx diaphoresis/night sweats, fatigue/malaise/generalized weakness, myalgias/body aches, poor appetite, nausea, abdominal discomfort (epigastric + generalized), bilateral flank pain and poor urinary flow/dribbling. He denies fever/chills. He states his symptoms have been progressively worse in the past several days. He states the worsening symptoms prompted him to see his PCP on 06/21, at which time labwork was done. He was called at home, told he was in renal failure, and was instructed to go to the ED. Cr 2.21 (baseline 0.8-0.9). Imaging demonstrates prostatic enlargement w/ mild L hydro. Findings were discussed w/ Radiologist by ED provider Dr. Karma Greaser, and pt is believed pt's pathology is due to prostatic obstruction in the setting of suspected lymphoma recurrence. Pt states he has been in remission, and had his infusion port removed in late February 2019.  Pt comfortable, does not appear septic/toxic, not in distress after receiving pain medication in ED. Pt was receiving his Oncologic care at [facility]. Based on discussion w/ pt and Dr. Karma Greaser, I believe the most prudent course of action would be to rehydrate, correct metabolic/electrolyte abnl, improve symptoms and stabilize, with the intent to discharge pt home on Sunday 06/23 AM, to go to [facility] ED for admission that same day. He tells me his son's birthday is on Sunday afternoon (@~1400PM), and he would like to spend a short time there before going back to the  hospital.  PAST MEDICAL HISTORY:   Past Medical History:  Diagnosis Date  . ADHD, predominantly inattentive type   . Diffuse large B cell lymphoma ([place])   . Elevated LFTs 11/19/2013  . Erectile dysfunction   . Generalized anxiety disorder   . Gout   . Hypertension   . Recurrent cold sores   . Seborrheic dermatitis     PAST SURGICAL HISTORY:   Past Surgical History:  Procedure Laterality Date  . VASECTOMY  2014    SOCIAL HISTORY:   Social History   Tobacco Use  . Smoking status: Never Smoker  . Smokeless tobacco: Never Used  Substance Use Topics  . Alcohol use: Yes    Comment: daily use     FAMILY HISTORY:  History reviewed. No pertinent family history.  DRUG ALLERGIES:   Allergies  Allergen Reactions  . Sulfa Antibiotics Hives    REVIEW OF SYSTEMS:   Review of Systems  Constitutional: Positive for diaphoresis and malaise/fatigue. Negative for chills, fever and weight loss.  HENT: Negative for congestion, ear pain, hearing loss, nosebleeds, sinus pain, sore throat and tinnitus.   Eyes: Negative for blurred vision, double vision and photophobia.  Respiratory: Negative for cough, hemoptysis, sputum production, shortness of breath and wheezing.   Cardiovascular: Negative for chest pain, palpitations, orthopnea, claudication, leg swelling and PND.  Gastrointestinal: Positive for abdominal pain and nausea. Negative for blood in stool, constipation, diarrhea, heartburn, melena and vomiting.  Genitourinary: Positive for flank pain. Negative for dysuria, frequency, hematuria and urgency.  Musculoskeletal: Positive for myalgias. Negative for back pain, joint  pain and neck pain.  Skin: Negative for itching and rash.  Neurological: Positive for weakness. Negative for dizziness, tingling, tremors, sensory change, speech change, focal weakness, seizures, loss of consciousness and headaches.  Psychiatric/Behavioral: Negative for memory loss. The patient does not have  insomnia.    MEDICATIONS AT HOME:   Prior to Admission medications   Medication Sig Start Date End Date Taking? Authorizing Provider  DULoxetine (CYMBALTA) 30 MG capsule TAKE 3 CAPSULES BY MOUTH EVERY DAY 02/26/18  Yes Venia Carbon, MD  HYDROcodone-acetaminophen (NORCO/VICODIN) 5-325 MG tablet Take 1 tablet by mouth 2 (two) times daily as needed for moderate pain. 04/03/18  Yes Venia Carbon, MD  LORazepam (ATIVAN) 1 MG tablet Take 0.5-1 tablets (0.5-1 mg total) by mouth at bedtime as needed. for anxiety 03/21/18  Yes Venia Carbon, MD  losartan (COZAAR) 100 MG tablet Take 1 tablet (100 mg total) by mouth daily. 08/07/17  Yes Viviana Simpler I, MD  sildenafil (REVATIO) 20 MG tablet TAKE 3 TO 5 TABLETS DAILY AS NEEDED 10/17/17  Yes [provider]      VITAL SIGNS:  Blood pressure 128/76, pulse 83, temperature 98.4 F (36.9 C), temperature source Oral, resp. rate 16, height 6\' 2"  (1.88 m), weight 113.9 kg (251 lb), SpO2 95 %.  PHYSICAL EXAMINATION:  Physical Exam  Constitutional: He is oriented to person, place, and time. He appears well-developed and well-nourished. He is active and cooperative.  Non-toxic appearance. He does not have a sickly appearance. He does not appear ill. No distress. He is not intubated.  HENT:  Head: Normocephalic and atraumatic.  Mouth/Throat: Oropharynx is clear and moist. No oropharyngeal exudate.  Eyes: Conjunctivae, EOM and lids are normal. No scleral icterus.  Neck: Neck supple. No JVD present. No thyromegaly present.  Cardiovascular: Normal rate, regular rhythm, S1 normal, S2 normal and normal heart sounds.  No extrasystoles are present. Exam reveals no gallop, no S3, no S4, no distant heart sounds and no friction rub.  No murmur heard. Pulmonary/Chest: Effort normal and breath sounds normal. No accessory muscle usage or stridor. No apnea, no tachypnea and no bradypnea. He is not intubated. No respiratory distress. He has no decreased  breath sounds. He has no wheezes. He has no rhonchi. He has no rales.  Abdominal: Soft. Bowel sounds are normal. He exhibits no distension. There is tenderness. There is no rebound and no guarding.  Musculoskeletal: Normal range of motion. He exhibits no edema or tenderness.  Lymphadenopathy:    He has no cervical adenopathy.  Neurological: He is alert and oriented to person, place, and time.  Skin: Skin is warm, dry and intact. No rash noted. He is not diaphoretic. No erythema.  Psychiatric: His speech is normal and behavior is normal. Judgment and thought content normal. His mood appears anxious. Cognition and memory are normal.   Non-tachycardic at the time of my examination. He is (justifiably) worried. LABORATORY PANEL:   CBC Recent Labs  Lab 04/04/18 2320  WBC 5.2  HGB 12.9*  HCT 35.7*  PLT 251   ------------------------------------------------------------------------------------------------------------------  Chemistries  Recent Labs  Lab 04/04/18 2320  NA 138  K 3.4*  CL 101  CO2 23  GLUCOSE 122*  BUN 22*  CREATININE 2.21*  CALCIUM 9.2  AST 50*  ALT 37  ALKPHOS 68  BILITOT 0.9   ------------------------------------------------------------------------------------------------------------------  Cardiac Enzymes No results for input(s): TROPONINI in the last 168 hours. ------------------------------------------------------------------------------------------------------------------  RADIOLOGY:  US Renal  Result Date: 04/04/2018 CLINICAL DATA:  37 year old male with acute renal insufficiency. History of B-cell lymphoma. EXAM: RENAL / URINARY TRACT ULTRASOUND COMPLETE COMPARISON:  CT Abdomen and Pelvis 12/27/2017. FINDINGS: Right Kidney: Length: 13.9 centimeters. Echogenicity within normal limits. No mass or hydronephrosis visualized. Left Kidney: Length: 13.5 centimeters. There is mild hydronephrosis (image 31) which is new. Otherwise normal ultrasound appearance  of the left kidney. Postvoid images of the left kidney appear unchanged (image 56). Bladder: Small bladder volume. Nonspecific mild to moderate bladder wall thickening (image 51). No urinary debris. Other findings: Trace free fluid about the liver, Morrison's pouch (image 6). IMPRESSION: 1. Mild left hydronephrosis is new since 12/27/2017, etiology unclear. There is mild to moderate nonspecific bladder wall thickening. 2. The right kidney appears normal. 3. Small volume of nonspecific perihepatic free fluid/ascites. Electronically Signed   By: Genevie Ann M.D.   On: 04/04/2018 22:53   Ct Renal Stone Study  Result Date: 04/05/2018 CLINICAL DATA:  Abdominal pain pressure with urination, decreased urination EXAM: CT ABDOMEN AND PELVIS WITHOUT CONTRAST TECHNIQUE: Multidetector CT imaging of the abdomen and pelvis was performed following the standard protocol without IV contrast. COMPARISON:  Ultrasound 04/04/2018, CT 12/27/2017 FINDINGS: Lower chest: Lung bases demonstrate calcified left lower lobe granuloma. Calcified left infrahilar lymph nodes. Heart size within normal limits. Trace right pleural effusion. Hepatobiliary: No focal hepatic abnormality. Negative for biliary enlargement. Significant wall thickening of the gallbladder. No calcified stones. Masslike area measuring 5.5 x 4.1 cm at the falciform ligament. Pancreas: Unremarkable. No pancreatic ductal dilatation or surrounding inflammatory changes. Spleen: No focal abnormality.  Slightly enlarged at 15 cm. Adrenals/Urinary Tract: Adrenal glands are within normal limits. Mild left hydronephrosis. No ureteral stone. Diffuse bladder wall thickening with nodularity along the anterior wall on the left side. Stomach/Bowel: The stomach is nonenlarged. No dilated small bowel. No colon wall thickening. Vascular/Lymphatic: Mild aortic atherosclerosis. No aneurysmal dilatation. Slight increased size portal caval node measuring 22 mm compared with 15 mm previously.  Reproductive: Interval marked enlargement of the prostate gland, now measuring 9.3 cm transverse by 6.3 cm AP. Other: No free air. Small amount of ascites within the abdomen and pelvis. Fluid infiltration along the mesentery. Diffuse nodular thickening of the anterior peritoneal cavity. Small amount of ascites around the liver and spleen. Suspected mass in the right upper quadrant of the abdomen measuring 7.5 by 5.8 cm. Musculoskeletal: No acute or suspicious abnormality. IMPRESSION: 1. Mild left hydronephrosis and hydroureter, but no ureteral stone visible. 2. Interim development of diffuse infiltration and thickening of the peritoneal cavity, appearance of which suggests carcinomatosis or lymphomatosis, given history of prior lymphoma. Development of masslike region along the falciform ligament, in addition to a 7.5 cm right upper quadrant intraperitoneal mass, also suspicious for metastatic disease/possible lymphoma recurrence. 3. Diffuse gallbladder wall thickening, can also be seen in the setting of lymphoma. Cholecystitis may also be considered. 4. Interval massive enlargement of the prostate, can also be seen in the setting of lymphoma. 5. Diffuse bladder wall thickening with masslike appearance along the left anterior wall. 6. Small right pleural effusion. Electronically Signed   By: Donavan Foil M.D.   On: 04/05/2018 00:34   US Abdomen Limited Ruq  Result Date: 04/05/2018 CLINICAL DATA:  Gallbladder wall thickening on CT. EXAM: ULTRASOUND ABDOMEN LIMITED RIGHT UPPER QUADRANT COMPARISON:  CT abdomen pelvis 04/04/2018 FINDINGS: Gallbladder: No gallstones are demonstrated. There is diffuse thickening and edema of the gallbladder wall with gallbladder wall thickness measuring up to 9 mm. Echogenic appearance of the bile suggesting  sludge. Gallbladder wall thickening is nonspecific and could indicate acalculous cholecystitis, lymphoma, or may be related to liver disease, heart disease, or hypoproteinemia.  Common bile duct: Diameter: 3 mm, normal Liver: No focal lesion identified. Within normal limits in parenchymal echogenicity. The mass like structure seen inferior to the liver is not identified specifically sonographically. Small amount of free fluid demonstrated around the liver. Portal vein is patent on color Doppler imaging with normal direction of blood flow towards the liver. IMPRESSION: 1. Prominent diffuse thickening of the gallbladder wall with edema and gallbladder sludge. No stones identified. This appearance is nonspecific in could indicate acalculous cholecystitis, lymphoma, or changes due to liver disease, heart disease, or hypoproteinemia. 2. Small amount of free fluid around the liver. 3. Mass adjacent to the liver seen at CT is not identified sonographically for correlation. Electronically Signed   By: Lucienne Capers M.D.   On: 04/05/2018 02:37   IMPRESSION AND PLAN:   A/P: 78M acute kidney injury (likely post-renal), CT A/P (+) carcinomatosis/lymphomatosis + prostatic enlargement. Suspect lymphoma recurrence. Also w/ hypokalemia, hyperglycemia, mild transaminasemia, mild normocytic anemia. -Cr 2.21, baseline 0.8-0.9, acute kidney injury, likely post-renal -Pt still able to urinate -IVF, monitor BMP, avoid nephrotoxins -Hold ARB -Pathology likely 2/2 prostatic enlargement seen on CT imaging; will likely need expert input from Oncology, Urology and possibly Radiation Oncology for management recommendations -Symptomatic mgmt, pain ctrl, antiemetics -Replete K+ -Mag level pending -Hyperglycemia, transaminasemia, normocytic anemia likely 2/2 malignancy, abnormalities relatively mild; hydrate, monitor for improvement -c/w Cymbalta, Ativan -Renal diet as tolerated -Heparin -Full code -Admission, > 2 midnights; attempt to D/C 06/23 AM, for pt to [facility]   All the records are reviewed and case discussed with ED provider. Management plans discussed with the patient, family and  they are in agreement.  CODE STATUS: Full code  TOTAL TIME TAKING CARE OF THIS PATIENT: 90 minutes.    Arta Silence M.D on 04/05/2018 at 4:42 AM  Between 7am to 6pm - Pager - [phone]  After 6pm go to [email or web address] - Proofreader  [employer] [facility]  Office  [phone]  CC: Primary care physician; Venia Carbon, MD   Note: This dictation was prepared with Dragon dictation along with smaller phrase technology. Any transcriptional errors that result from this process are unintentional.

## 2018-04-05 NOTE — ED Provider Notes (Signed)
[facility] Emergency Department Provider Note  ____________________________________________   First MD Initiated Contact with Patient 04/05/18 0259     (approximate)  I have reviewed the triage vital signs and the nursing notes.   HISTORY  Chief Complaint Flank Pain (Bilateral)    HPI Mark Burns is a 37 y.o. 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  He received oncology care at [facility].  He presents tonight for evaluation of bilateral flank pain as well as generalized abdominal pain.  This is been going on for several days.  He also reports that he has been feeling like he is unable to urinate easily nor to 30 to completely empty his bladder when he goes.  He denies fever/chills, chest pain, shortness of breath.  He has been having some nausea but no vomiting.  He is not having dysuria.  He has not seen any blood in his urine.  He denies any history of kidney stones.  No new medications, no excessive working out or spending time outdoors and no dark urine.  Nothing in particular makes his symptoms better nor worse.  Past Medical History:  Diagnosis Date  . ADHD, predominantly inattentive type   . Diffuse large B cell lymphoma ([place])   . Elevated LFTs 11/19/2013  . Erectile dysfunction   . Generalized anxiety disorder   . Gout   . Hypertension   . Recurrent cold sores   . Seborrheic dermatitis     Patient Active Problem List   Diagnosis Date Noted  . Acute kidney injury ([place]) 04/05/2018  . Pleuritic chest pain 01/07/2018  . Pneumococcal pneumonia ([place]) 01/07/2018  . Diffuse large B cell lymphoma ([place])   . Gout   . Effusion of right knee joint 05/05/2017  . Preventative health care 03/19/2016  . Chronic back pain 03/19/2016  . Recurrent cold sores   . Seborrheic dermatitis   . ADHD, predominantly inattentive type   .  Generalized anxiety disorder   . Elevated LFTs 11/19/2013  . Erectile dysfunction   . Hypertension     Past Surgical History:  Procedure Laterality Date  . VASECTOMY  2014    Prior to Admission medications   Medication Sig Start Date End Date Taking? Authorizing Provider  DULoxetine (CYMBALTA) 30 MG capsule TAKE 3 CAPSULES BY MOUTH EVERY DAY 02/26/18  Yes Venia Carbon, MD  HYDROcodone-acetaminophen (NORCO/VICODIN) 5-325 MG tablet Take 1 tablet by mouth 2 (two) times daily as needed for moderate pain. 04/03/18  Yes Venia Carbon, MD  LORazepam (ATIVAN) 1 MG tablet Take 0.5-1 tablets (0.5-1 mg total) by mouth at bedtime as needed. for anxiety 03/21/18  Yes Venia Carbon, MD  losartan (COZAAR) 100 MG tablet Take 1 tablet (100 mg total) by mouth daily. 08/07/17  Yes Viviana Simpler I, MD  sildenafil (REVATIO) 20 MG tablet TAKE 3 TO 5 TABLETS DAILY AS NEEDED 10/17/17  Yes [provider]    Allergies Sulfa antibiotics  History reviewed. No pertinent family history.  Social History Social History   Tobacco Use  . Smoking status: Never Smoker  . Smokeless tobacco: Never Used  Substance Use Topics  . Alcohol use: Yes    Comment: daily use   . Drug use: No    Review of Systems Constitutional: No fever/chills Eyes: No visual changes. ENT: No sore throat. Cardiovascular: Denies chest  pain. Respiratory: Denies shortness of breath. Gastrointestinal: Generalized abdominal pain and bilateral flank pain as described above with nausea but no vomiting. Genitourinary: Urinary hesitancy and difficulty emptying his entire bladder.  negative for dysuria. No hematuria.  Musculoskeletal: Negative for neck pain.  Some bilateral flank pain. Integumentary: Negative for rash. Neurological: Negative for headaches, focal weakness or numbness.   ____________________________________________   PHYSICAL EXAM:  VITAL SIGNS: ED Triage Vitals  Enc Vitals Group     BP 04/04/18  2044 (!) 131/106     Pulse Rate 04/04/18 2044 (!) 117     Resp 04/04/18 2044 18     Temp 04/04/18 2044 98.7 F (37.1 C)     Temp Source 04/04/18 2044 Oral     SpO2 04/04/18 2044 99 %     Weight 04/04/18 2045 113.9 kg (251 lb)     Height 04/04/18 2045 1.88 m (6\' 2" )     Head Circumference --      Peak Flow --      Pain Score 04/04/18 2045 10     Pain Loc --      Pain Edu? --      Excl. in [place]? --     Constitutional: Alert and oriented. Well appearing and in no acute distress. Eyes: Conjunctivae are normal.  Head: Atraumatic. Nose: No congestion/rhinnorhea. Mouth/Throat: Mucous membranes are moist. Neck: No stridor.  No meningeal signs.   Cardiovascular: Normal rate, regular rhythm. Good peripheral circulation. Grossly normal heart sounds. Respiratory: Normal respiratory effort.  No retractions. Lungs CTAB. Gastrointestinal: Soft and nondistended with diffuse abdominal tenderness.  No rebound and no guarding Musculoskeletal: No lower extremity tenderness nor edema. No gross deformities of extremities.  No CVA tenderness to palpation Neurologic:  Normal speech and language. No gross focal neurologic deficits are appreciated.  Skin:  Skin is warm, dry and intact. No rash noted. Psychiatric: Mood and affect are normal. Speech and behavior are normal.  ____________________________________________   LABS (all labs ordered are listed, but only abnormal results are displayed)  Labs Reviewed  CBC WITH DIFFERENTIAL/PLATELET - Abnormal; Notable for the following components:      Result Value   RBC 3.80 (*)    Hemoglobin 12.9 (*)    HCT 35.7 (*)    MCHC 36.2 (*)    Lymphs Abs 0.4 (*)    All other components within normal limits  BASIC METABOLIC PANEL - Abnormal; Notable for the following components:   Potassium 3.4 (*)    Glucose, Bld 122 (*)    BUN 22 (*)    Creatinine, Ser 2.21 (*)    GFR calc non Af Amer 36 (*)    GFR calc Af Amer 42 (*)    All other components within normal  limits  URINALYSIS, COMPLETE (UACMP) WITH MICROSCOPIC - Abnormal; Notable for the following components:   Color, Urine YELLOW (*)    APPearance HAZY (*)    Hgb urine dipstick MODERATE (*)    Protein, ur 30 (*)    Bacteria, UA RARE (*)    All other components within normal limits  HEPATIC FUNCTION PANEL - Abnormal; Notable for the following components:   AST 50 (*)    All other components within normal limits  LIPASE, BLOOD  CK  PROTIME-INR  APTT   ____________________________________________  EKG  None - EKG not ordered by ED physician ____________________________________________  RADIOLOGY Ursula Alert, personally discussed these images and results by phone with the on-call radiologist and used this discussion  as part of my medical decision making.    ED MD interpretation:  Renal ultrasound concerning for left mild hydronephrosis and some gallbladder wall thickening also some nonspecific perihepatic free fluid.  The CT renal stone study did not show any signs of stones but had a number of generalized and concerning findings for probable recurrence of lymphoma including a tumor in the right upper quadrant, thickening/enlargement of the prostate, etc.  I discussed the case with the radiologist and proceeded with a gallbladder ultrasound as well which showed gallbladder wall thickening and sludge but no signs of stones, likely also secondary to lymphoma, less likely carcinomatosis.   Official radiology report(s): US Renal  Result Date: 04/04/2018 CLINICAL DATA:  37 year old male with acute renal insufficiency. History of B-cell lymphoma. EXAM: RENAL / URINARY TRACT ULTRASOUND COMPLETE COMPARISON:  CT Abdomen and Pelvis 12/27/2017. FINDINGS: Right Kidney: Length: 13.9 centimeters. Echogenicity within normal limits. No mass or hydronephrosis visualized. Left Kidney: Length: 13.5 centimeters. There is mild hydronephrosis (image 31) which is new. Otherwise normal ultrasound appearance  of the left kidney. Postvoid images of the left kidney appear unchanged (image 56). Bladder: Small bladder volume. Nonspecific mild to moderate bladder wall thickening (image 51). No urinary debris. Other findings: Trace free fluid about the liver, Morrison's pouch (image 6). IMPRESSION: 1. Mild left hydronephrosis is new since 12/27/2017, etiology unclear. There is mild to moderate nonspecific bladder wall thickening. 2. The right kidney appears normal. 3. Small volume of nonspecific perihepatic free fluid/ascites. Electronically Signed   By: Genevie Ann M.D.   On: 04/04/2018 22:53   Ct Renal Stone Study  Result Date: 04/05/2018 CLINICAL DATA:  Abdominal pain pressure with urination, decreased urination EXAM: CT ABDOMEN AND PELVIS WITHOUT CONTRAST TECHNIQUE: Multidetector CT imaging of the abdomen and pelvis was performed following the standard protocol without IV contrast. COMPARISON:  Ultrasound 04/04/2018, CT 12/27/2017 FINDINGS: Lower chest: Lung bases demonstrate calcified left lower lobe granuloma. Calcified left infrahilar lymph nodes. Heart size within normal limits. Trace right pleural effusion. Hepatobiliary: No focal hepatic abnormality. Negative for biliary enlargement. Significant wall thickening of the gallbladder. No calcified stones. Masslike area measuring 5.5 x 4.1 cm at the falciform ligament. Pancreas: Unremarkable. No pancreatic ductal dilatation or surrounding inflammatory changes. Spleen: No focal abnormality.  Slightly enlarged at 15 cm. Adrenals/Urinary Tract: Adrenal glands are within normal limits. Mild left hydronephrosis. No ureteral stone. Diffuse bladder wall thickening with nodularity along the anterior wall on the left side. Stomach/Bowel: The stomach is nonenlarged. No dilated small bowel. No colon wall thickening. Vascular/Lymphatic: Mild aortic atherosclerosis. No aneurysmal dilatation. Slight increased size portal caval node measuring 22 mm compared with 15 mm previously.  Reproductive: Interval marked enlargement of the prostate gland, now measuring 9.3 cm transverse by 6.3 cm AP. Other: No free air. Small amount of ascites within the abdomen and pelvis. Fluid infiltration along the mesentery. Diffuse nodular thickening of the anterior peritoneal cavity. Small amount of ascites around the liver and spleen. Suspected mass in the right upper quadrant of the abdomen measuring 7.5 by 5.8 cm. Musculoskeletal: No acute or suspicious abnormality. IMPRESSION: 1. Mild left hydronephrosis and hydroureter, but no ureteral stone visible. 2. Interim development of diffuse infiltration and thickening of the peritoneal cavity, appearance of which suggests carcinomatosis or lymphomatosis, given history of prior lymphoma. Development of masslike region along the falciform ligament, in addition to a 7.5 cm right upper quadrant intraperitoneal mass, also suspicious for metastatic disease/possible lymphoma recurrence. 3. Diffuse gallbladder wall thickening, can also  be seen in the setting of lymphoma. Cholecystitis may also be considered. 4. Interval massive enlargement of the prostate, can also be seen in the setting of lymphoma. 5. Diffuse bladder wall thickening with masslike appearance along the left anterior wall. 6. Small right pleural effusion. Electronically Signed   By: Donavan Foil M.D.   On: 04/05/2018 00:34   US Abdomen Limited Ruq  Result Date: 04/05/2018 CLINICAL DATA:  Gallbladder wall thickening on CT. EXAM: ULTRASOUND ABDOMEN LIMITED RIGHT UPPER QUADRANT COMPARISON:  CT abdomen pelvis 04/04/2018 FINDINGS: Gallbladder: No gallstones are demonstrated. There is diffuse thickening and edema of the gallbladder wall with gallbladder wall thickness measuring up to 9 mm. Echogenic appearance of the bile suggesting sludge. Gallbladder wall thickening is nonspecific and could indicate acalculous cholecystitis, lymphoma, or may be related to liver disease, heart disease, or hypoproteinemia.  Common bile duct: Diameter: 3 mm, normal Liver: No focal lesion identified. Within normal limits in parenchymal echogenicity. The mass like structure seen inferior to the liver is not identified specifically sonographically. Small amount of free fluid demonstrated around the liver. Portal vein is patent on color Doppler imaging with normal direction of blood flow towards the liver. IMPRESSION: 1. Prominent diffuse thickening of the gallbladder wall with edema and gallbladder sludge. No stones identified. This appearance is nonspecific in could indicate acalculous cholecystitis, lymphoma, or changes due to liver disease, heart disease, or hypoproteinemia. 2. Small amount of free fluid around the liver. 3. Mass adjacent to the liver seen at CT is not identified sonographically for correlation. Electronically Signed   By: Lucienne Capers M.D.   On: 04/05/2018 02:37    ____________________________________________   PROCEDURES  Critical Care performed: No   Procedure(s) performed:   Procedures   ____________________________________________   INITIAL IMPRESSION / ASSESSMENT AND PLAN / ED COURSE  As part of my medical decision making, I reviewed the following data within the [place] notes reviewed and incorporated, Labs reviewed , Old chart reviewed, Discussed with admitting physician , Discussed with radiologist and Notes from prior ED visits.    Differential diagnosis includes, but is not limited to, renal colic/ureteral stone, UTI/pyelonephritis, musculoskeletal pain, biliary colic, pancreatitis, intra-abdominal infection otherwise unspecified, or neoplastic process.  A renal ultrasound and CT renal protocol were both ordered before I saw the patient.  When I saw him the renal protocol results were not yet back and I was hopeful after examining him that his results would just be because of an obstructive kidney stone.  However, his lab work is notable for acute  renal failure with a creatinine of greater than 2 with a normal baseline around 0.8.  I added on LFTs and lipase, and these are normal.  Lactic acid is normal.  CBC unremarkable.  CK is normal.    Imaging:  Renal ultrasound concerning for left mild hydronephrosis and some gallbladder wall thickening also some nonspecific perihepatic free fluid.  The CT renal stone study did not show any signs of stones but had a number of generalized and concerning findings for probable recurrence of lymphoma including a tumor in the right upper quadrant, thickening/enlargement of the prostate, etc.  I discussed the case with the radiologist and proceeded with a gallbladder ultrasound as well which showed gallbladder wall thickening and sludge but no signs of stones, likely also secondary to lymphoma, less likely carcinomatosis.  I discussed the results with the patient at great length.  Given his acute renal failure in the setting of what  is most likely obstruction from the prostatic enlargement, I will admit him to the hospitalist so that he can have renal evaluation and oncology evaluation.  He may choose to go to [facility] for treatment, but in the short-term we need to ensure that his kidney function is adequate and not significantly worsening.  He is quite stunned and upset by the news which is understandable and appropriate.  I gave 2 L of normal saline IV in the ED, and I gave 2 separate doses of morphine 4 mg IV for persistent abdominal pain.  I have discussed the case with the hospitalist who will admit for further management.     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[place])      MEDICATIONS GIVEN DURING THIS VISIT:  Medications  fentaNYL (SUBLIMAZE) injection 50 mcg (50 mcg Intravenous Given 04/04/18 2340)  senna-docusate (Senokot-S) tablet 1 tablet (has no administration in time range)  bisacodyl (DULCOLAX) EC tablet 5 mg (has no administration in time range)  ondansetron (ZOFRAN) tablet 4 mg (has no administration in time range)    Or  ondansetron (ZOFRAN) injection 4 mg (has no administration in time range)  heparin injection 5,000 Units (has no administration in time range)  acetaminophen (TYLENOL) tablet 650 mg (has no administration in time range)    Or  acetaminophen (TYLENOL) suppository 650 mg (has no administration in time range)  morphine 2 MG/ML injection 2 mg (2 mg Intravenous Given 04/05/18 0425)    Or  morphine 4 MG/ML injection 4 mg ( Intravenous See Alternative 04/05/18 0425)  0.9 %  sodium chloride infusion ( Intravenous New Bag/Given 04/05/18 0423)  ondansetron (ZOFRAN) injection 4 mg (4 mg Intravenous Given 04/05/18 0032)  morphine 4 MG/ML injection 4 mg (4 mg Intravenous Given 04/05/18 0032)  sodium chloride 0.9 % bolus 1,000 mL (0 mLs Intravenous Stopped 04/05/18 0258)  morphine 4 MG/ML injection 4 mg (4 mg Intravenous Given 04/05/18 0124)  sodium chloride 0.9 % bolus 1,000 mL (0 mLs Intravenous Stopped 04/05/18 0355)     ED Discharge Orders    None       Note:  This document was prepared using Dragon voice recognition software and may include unintentional dictation errors.    Hinda Kehr, MD 04/05/18 [phone]

## 2018-04-05 NOTE — Consult Note (Signed)
Hematology/Oncology Consult note [facility] [phone] [phone]  Patient Care Team: Venia Carbon, MD as PCP - General (Internal Medicine)   Name of the patient: Mark Burns  [id number]  Dec 24, 1980   Date of visit: 04/05/18 REASON FOR COSULTATION:   History of presenting illness-  37 y.o. male with PMH listed at below who presents to ER for evaluation of 1 week history of fatigue, night sweats, generalized weakness.  Bilateral leg pain and a poor urinary flow/dribbling.  Symptom has been progressively worse for the past several days. He was seen by PCP on 04/04/2018 and had some lab work done. He was called at home and was instructed by PCP to go to emergency room due to lab work showing AKI.  In the emergency room his creatinine was 2.21, elevated that his baseline. CT renal stone showed market enlargement of prostate gland, measuring 9.3 cm x 6.3 cm.  There is also interim development of diffuse infiltration and thickening of the peritoneal cavity, suggest carcinomatosis/lymphomatosis.  There is also development of masslike region along the falciform ligament in addition to the 7.5 cm right upper quadrant intraperitoneal mass, suspicious for lymphoma recurrence.  Patient has a history of high-grade B-cell lymphoma that was treated at [facility] by Dr. Josie Saunders. Reviewed patient's oncology history in care everywhere. Lymphoma was first diagnosed in September 2018 when he presented to emergency room for worsening left lower quadrant abdominal pain with CT abdomen pelvis showed generalized adenopathy, splenomegaly of 18 cm concerning for lymphoma. 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 Patient was referred to [facility] and was treated with 6 cycles of R-HperCVAD + intrathecal chemotherapy, finished on November 01, 2017. Patient achieved complete response.     Review of Systems   Constitutional: Positive for malaise/fatigue and weight loss.  HENT: Negative for congestion and nosebleeds.   Eyes: Negative for double vision, photophobia and pain.  Respiratory: Negative for cough and wheezing.   Cardiovascular: Negative for leg swelling.  Gastrointestinal: Positive for abdominal pain.  Genitourinary: Positive for flank pain.       Difficulty urination  Skin: Negative for itching and rash.  Neurological: Negative for focal weakness.  Endo/Heme/Allergies: Does not bruise/bleed easily.  Psychiatric/Behavioral: Positive for depression. Negative for suicidal ideas.    Allergies  Allergen Reactions  . Sulfa Antibiotics Hives    Patient Active Problem List   Diagnosis Date Noted  . Acute kidney injury ([place]) 04/05/2018  . Pleuritic chest pain 01/07/2018  . Pneumococcal pneumonia ([place]) 01/07/2018  . Diffuse large B cell lymphoma ([place])   . Gout   . Effusion of right knee joint 05/05/2017  . Preventative health care 03/19/2016  . Chronic back pain 03/19/2016  . Recurrent cold sores   . Seborrheic dermatitis   . ADHD, predominantly inattentive type   . Generalized anxiety disorder   . Elevated LFTs 11/19/2013  . Erectile dysfunction   . Hypertension      Past Medical History:  Diagnosis Date  . ADHD, predominantly inattentive type   . Diffuse large B cell lymphoma ([place])   . Elevated LFTs 11/19/2013  . Erectile dysfunction   . Generalized anxiety disorder   . Gout   . Hypertension   . Recurrent cold sores   . Seborrheic dermatitis      Past Surgical History:  Procedure Laterality Date  . VASECTOMY  2014    Social History   Socioeconomic History  .  Marital status: Married    Spouse name: Not on file  . Number of children: 2  . Years of education: Not on file  . Highest education level: Not on file  Occupational History  . Occupation: PE Pharmacist, hospital    Comment[employer]ensed conveyancer education center  Social Needs  . Financial resource strain: Not on file  .  Food insecurity:    Worry: Not on file    Inability: Not on file  . Transportation needs:    Medical: Not on file    Non-medical: Not on file  Tobacco Use  . Smoking status: Never Smoker  . Smokeless tobacco: Never Used  Substance and Sexual Activity  . Alcohol use: Yes    Comment: daily use   . Drug use: No  . Sexual activity: Not on file  Lifestyle  . Physical activity:    Days per week: Not on file    Minutes per session: Not on file  . Stress: Not on file  Relationships  . Social connections:    Talks on phone: Not on file    Gets together: Not on file    Attends religious service: Not on file    Active member of club or organization: Not on file    Attends meetings of clubs or organizations: Not on file    Relationship status: Not on file  . Intimate partner violence:    Fear of current or ex partner: Not on file    Emotionally abused: Not on file    Physically abused: Not on file    Forced sexual activity: Not on file  Other Topics Concern  . Not on file  Social History Narrative   Played college football, married to "Judson Roch."      History reviewed. No pertinent family history.   Current Facility-Administered Medications:  .  0.9 %  sodium chloride infusion, , Intravenous, Continuous, Sridharan, Prasanna, MD, Last Rate: 75 mL/hr at 04/05/18 0740 .  acetaminophen (TYLENOL) tablet 650 mg, 650 mg, Oral, Q6H PRN **OR** acetaminophen (TYLENOL) suppository 650 mg, 650 mg, Rectal, Q6H PRN, Jodell Cipro, Prasanna, MD .  bisacodyl (DULCOLAX) EC tablet 5 mg, 5 mg, Oral, Daily PRN, Jodell Cipro, Prasanna, MD .  DULoxetine (CYMBALTA) DR capsule 90 mg, 90 mg, Oral, Daily, Sridharan, Prasanna, MD, 90 mg at 04/05/18 0927 .  heparin injection 5,000 Units, 5,000 Units, Subcutaneous, Q8H, Arta Silence, MD, 5,000 Units at 04/05/18 1452 .  LORazepam (ATIVAN) tablet 0.5-1 mg, 0.5-1 mg, Oral, QHS PRN, Jodell Cipro, Prasanna, MD .  morphine 2 MG/ML injection 2 mg, 2 mg, Intravenous, Q3H  PRN, 2 mg at 04/05/18 1506 **OR** morphine 4 MG/ML injection 4 mg, 4 mg, Intravenous, Q3H PRN, Jodell Cipro, Prasanna, MD .  ondansetron (ZOFRAN) tablet 4 mg, 4 mg, Oral, Q6H PRN **OR** ondansetron (ZOFRAN) injection 4 mg, 4 mg, Intravenous, Q6H PRN, Jodell Cipro, Prasanna, MD .  senna-docusate (Senokot-S) tablet 1 tablet, 1 tablet, Oral, QHS PRN, Arta Silence, MD   Physical exam: ECOG 1 Vitals:   04/05/18 0302 04/05/18 0419 04/05/18 0744 04/05/18 1334  BP: 125/80 128/76 120/87 (!) 130/92  Pulse: 90 83 94 93  Resp: 16  20 20   Temp:  98.4 F (36.9 C) 98.3 F (36.8 C) 98.5 F (36.9 C)  TempSrc:  Oral    SpO2: 95% 95% 100% 99%  Weight:      Height:       Physical Exam  Constitutional: He is oriented to person, place, and time.  Anxious appearance  HENT:  Head: Normocephalic and atraumatic.  Mouth/Throat: No oropharyngeal exudate.  Eyes: Pupils are equal, round, and reactive to light. EOM are normal. No scleral icterus.  Neck: Normal range of motion. Neck supple.  Cardiovascular: Normal rate, regular rhythm and normal heart sounds.  No murmur heard. Pulmonary/Chest: Effort normal and breath sounds normal. No respiratory distress. He has no wheezes. He has no rales. He exhibits no tenderness.  Abdominal: Soft. Bowel sounds are normal. He exhibits no distension. There is tenderness.  Musculoskeletal: Normal range of motion. He exhibits no tenderness.  Lymphadenopathy:    He has no cervical adenopathy.  Neurological: He is alert and oriented to person, place, and time. No cranial nerve deficit.  Skin: Skin is warm and dry. No rash noted. He is not diaphoretic. No erythema.        CMP Latest Ref Rng & Units 04/05/2018  Glucose 65 - 99 mg/dL 87  BUN 6 - 20 mg/dL 20  Creatinine 0.61 - 1.24 mg/dL 1.71(H)  Sodium 135 - 145 mmol/L 136  Potassium 3.5 - 5.1 mmol/L 3.9  Chloride 101 - 111 mmol/L 105  CO2 22 - 32 mmol/L 21(L)  Calcium 8.9 - 10.3 mg/dL 8.2(L)  Total Protein 6.5 - 8.1  g/dL 6.0(L)  Total Bilirubin 0.3 - 1.2 mg/dL 0.9  Alkaline Phos 38 - 126 U/L 66  AST 15 - 41 U/L 44(H)  ALT 17 - 63 U/L 34   CBC Latest Ref Rng & Units 04/04/2018  WBC 3.8 - 10.6 K/uL 5.2  Hemoglobin 13.0 - 18.0 g/dL 12.9(L)  Hematocrit 40.0 - 52.0 % 35.7(L)  Platelets 150 - 440 K/uL 251   RADIOGRAPHIC STUDIES: I have personally reviewed the radiological images as listed and agreed with the findings in the report.  US Renal  Result Date: 04/04/2018 CLINICAL DATA:  37 year old male with acute renal insufficiency. History of B-cell lymphoma. EXAM: RENAL / URINARY TRACT ULTRASOUND COMPLETE COMPARISON:  CT Abdomen and Pelvis 12/27/2017. FINDINGS: Right Kidney: Length: 13.9 centimeters. Echogenicity within normal limits. No mass or hydronephrosis visualized. Left Kidney: Length: 13.5 centimeters. There is mild hydronephrosis (image 31) which is new. Otherwise normal ultrasound appearance of the left kidney. Postvoid images of the left kidney appear unchanged (image 56). Bladder: Small bladder volume. Nonspecific mild to moderate bladder wall thickening (image 51). No urinary debris. Other findings: Trace free fluid about the liver, Morrison's pouch (image 6). IMPRESSION: 1. Mild left hydronephrosis is new since 12/27/2017, etiology unclear. There is mild to moderate nonspecific bladder wall thickening. 2. The right kidney appears normal. 3. Small volume of nonspecific perihepatic free fluid/ascites. Electronically Signed   By: Genevie Ann M.D.   On: 04/04/2018 22:53   Ct Renal Stone Study  Result Date: 04/05/2018 CLINICAL DATA:  Abdominal pain pressure with urination, decreased urination EXAM: CT ABDOMEN AND PELVIS WITHOUT CONTRAST TECHNIQUE: Multidetector CT imaging of the abdomen and pelvis was performed following the standard protocol without IV contrast. COMPARISON:  Ultrasound 04/04/2018, CT 12/27/2017 FINDINGS: Lower chest: Lung bases demonstrate calcified left lower lobe granuloma. Calcified left  infrahilar lymph nodes. Heart size within normal limits. Trace right pleural effusion. Hepatobiliary: No focal hepatic abnormality. Negative for biliary enlargement. Significant wall thickening of the gallbladder. No calcified stones. Masslike area measuring 5.5 x 4.1 cm at the falciform ligament. Pancreas: Unremarkable. No pancreatic ductal dilatation or surrounding inflammatory changes. Spleen: No focal abnormality.  Slightly enlarged at 15 cm. Adrenals/Urinary Tract: Adrenal glands are within normal limits. Mild left hydronephrosis. No ureteral stone. Diffuse  bladder wall thickening with nodularity along the anterior wall on the left side. Stomach/Bowel: The stomach is nonenlarged. No dilated small bowel. No colon wall thickening. Vascular/Lymphatic: Mild aortic atherosclerosis. No aneurysmal dilatation. Slight increased size portal caval node measuring 22 mm compared with 15 mm previously. Reproductive: Interval marked enlargement of the prostate gland, now measuring 9.3 cm transverse by 6.3 cm AP. Other: No free air. Small amount of ascites within the abdomen and pelvis. Fluid infiltration along the mesentery. Diffuse nodular thickening of the anterior peritoneal cavity. Small amount of ascites around the liver and spleen. Suspected mass in the right upper quadrant of the abdomen measuring 7.5 by 5.8 cm. Musculoskeletal: No acute or suspicious abnormality. IMPRESSION: 1. Mild left hydronephrosis and hydroureter, but no ureteral stone visible. 2. Interim development of diffuse infiltration and thickening of the peritoneal cavity, appearance of which suggests carcinomatosis or lymphomatosis, given history of prior lymphoma. Development of masslike region along the falciform ligament, in addition to a 7.5 cm right upper quadrant intraperitoneal mass, also suspicious for metastatic disease/possible lymphoma recurrence. 3. Diffuse gallbladder wall thickening, can also be seen in the setting of lymphoma.  Cholecystitis may also be considered. 4. Interval massive enlargement of the prostate, can also be seen in the setting of lymphoma. 5. Diffuse bladder wall thickening with masslike appearance along the left anterior wall. 6. Small right pleural effusion. Electronically Signed   By: Donavan Foil M.D.   On: 04/05/2018 00:34   US Abdomen Limited Ruq  Result Date: 04/05/2018 CLINICAL DATA:  Gallbladder wall thickening on CT. EXAM: ULTRASOUND ABDOMEN LIMITED RIGHT UPPER QUADRANT COMPARISON:  CT abdomen pelvis 04/04/2018 FINDINGS: Gallbladder: No gallstones are demonstrated. There is diffuse thickening and edema of the gallbladder wall with gallbladder wall thickness measuring up to 9 mm. Echogenic appearance of the bile suggesting sludge. Gallbladder wall thickening is nonspecific and could indicate acalculous cholecystitis, lymphoma, or may be related to liver disease, heart disease, or hypoproteinemia. Common bile duct: Diameter: 3 mm, normal Liver: No focal lesion identified. Within normal limits in parenchymal echogenicity. The mass like structure seen inferior to the liver is not identified specifically sonographically. Small amount of free fluid demonstrated around the liver. Portal vein is patent on color Doppler imaging with normal direction of blood flow towards the liver. IMPRESSION: 1. Prominent diffuse thickening of the gallbladder wall with edema and gallbladder sludge. No stones identified. This appearance is nonspecific in could indicate acalculous cholecystitis, lymphoma, or changes due to liver disease, heart disease, or hypoproteinemia. 2. Small amount of free fluid around the liver. 3. Mass adjacent to the liver seen at CT is not identified sonographically for correlation. Electronically Signed   By: Lucienne Capers M.D.   On: 04/05/2018 02:37    Assessment and plan- Patient is a 37 y.o. male with history of high-grade B-cell lymphoma, status post systemic chemotherapy plus intrathecal  chemotherapy, finished in January 2019 achieved complete response presented emergency room with constitutional symptoms, bilateral flank pain and abdominal pain.  #Acute kidney injury,  CT image and ultrasound independently reviewed by me.  Most likely post renal obstruction prostate enlargement. Agree with Foley catheter, IV fluid and monitor kidney function.  Today his creatinine showed improvement, decreased from 2.2-1.7. Avoid nephrotoxins.  #Clinical lymphoma recurrence : Labs reviewed.  Normal white count, hemoglobin at baseline, mild lymphopenia. abnormal CT findings including market prostate enlargement, abdominal peritoneal nodularity, falciform ligament masslike area, right upper quadrant intra-abdominal mass, highly suspicious for recurrence of lymphoma.  Patient really wants  to go home if possible tomorrow as that is his son's birthday and he want to be home shortly before coming back to hospital for treatment. I talked to patient's oncologist at [facility] Dr. Reita Chard, if patient kidney function/condition stabilized tomorrow, he may be released from the hospital.  Dr. Josie Saunders will be in touch with patient and arrange clinical visits and biopsy early next week.  We will hold steroids for now. Continue close monitor.  check uric acid, LDH.  Watch for tumor lysis syndrome. Recommend aggressive Hydration. I increased IVF to 167m/hour.   Start allopurinol. Dr. BReita Chardrequested a copy of CD with patient CT image to be given to patient at discharge.  I communicated with Dr. GMargaretmary Eddy  We will continue to follow patient's inpatient course. Dr. GMargaretmary Eddythank you for allowing me to participate in the care of this patient.  Total face to face encounter time for this patient visit was 70 min. >50% of the time was  spent in counseling and coordination of care.    ZEarlie Server MD, PhD Hematology Oncology CRaritan [facility] Center - Perth Amboyat AHurst Ambulatory Surgery Center LLC Dba Precinct Amb[facility] Surgery Center LLCPager- [id number]/22/2019

## 2018-04-06 DIAGNOSIS — N1330 Unspecified hydronephrosis: Secondary | ICD-10-CM

## 2018-04-06 DIAGNOSIS — N401 Enlarged prostate with lower urinary tract symptoms: Secondary | ICD-10-CM

## 2018-04-06 DIAGNOSIS — R338 Other retention of urine: Secondary | ICD-10-CM

## 2018-04-06 DIAGNOSIS — E790 Hyperuricemia without signs of inflammatory arthritis and tophaceous disease: Secondary | ICD-10-CM

## 2018-04-06 LAB — CBC
HCT: 30.8 % — ABNORMAL LOW (ref 40.0–52.0)
HEMOGLOBIN: 11 g/dL — AB (ref 13.0–18.0)
MCH: 33.8 pg (ref 26.0–34.0)
MCHC: 35.9 g/dL (ref 32.0–36.0)
MCV: 94.2 fL (ref 80.0–100.0)
PLATELETS: 184 10*3/uL (ref 150–440)
RBC: 3.27 MIL/uL — AB (ref 4.40–5.90)
RDW: 13.1 % (ref 11.5–14.5)
WBC: 2.7 10*3/uL — ABNORMAL LOW (ref 3.8–10.6)

## 2018-04-06 LAB — COMPREHENSIVE METABOLIC PANEL
ALBUMIN: 3.6 g/dL (ref 3.5–5.0)
ALK PHOS: 64 U/L (ref 38–126)
ALT: 29 U/L (ref 17–63)
ANION GAP: 9 (ref 5–15)
AST: 35 U/L (ref 15–41)
BUN: 17 mg/dL (ref 6–20)
CHLORIDE: 109 mmol/L (ref 101–111)
CO2: 22 mmol/L (ref 22–32)
Calcium: 8.3 mg/dL — ABNORMAL LOW (ref 8.9–10.3)
Creatinine, Ser: 1.48 mg/dL — ABNORMAL HIGH (ref 0.61–1.24)
GFR calc Af Amer: >60 mL/min (ref >60)
GFR calc non Af Amer: 58 mL/min — ABNORMAL LOW (ref >60)
GLUCOSE: 98 mg/dL (ref 65–99)
Potassium: 3.9 mmol/L (ref 3.5–5.1)
SODIUM: 140 mmol/L (ref 135–145)
Total Bilirubin: 0.6 mg/dL (ref 0.3–1.2)
Total Protein: 5.7 g/dL — ABNORMAL LOW (ref 6.5–8.1)

## 2018-04-06 LAB — URIC ACID: Uric Acid, Serum: 11.6 mg/dL — ABNORMAL HIGH (ref 4.4–7.6)

## 2018-04-06 LAB — MAGNESIUM: MAGNESIUM: 2 mg/dL (ref 1.7–2.4)

## 2018-04-06 MED ORDER — ALLOPURINOL 300 MG PO TABS
300.0000 mg | ORAL_TABLET | Freq: Every day | ORAL | Status: DC
Start: 2018-04-07 — End: 2018-04-06

## 2018-04-06 MED ORDER — ALFUZOSIN HCL ER 10 MG PO TB24
10.0000 mg | ORAL_TABLET | Freq: Every day | ORAL | Status: DC
  Administered 2018-04-07 – 2018-04-12 (×5): 10 mg via ORAL
  Filled 2018-04-06 (×6): qty 1

## 2018-04-06 MED ORDER — OXYCODONE-ACETAMINOPHEN 5-325 MG PO TABS
1.0000 | ORAL_TABLET | Freq: Four times a day (QID) | ORAL | Status: DC | PRN
  Administered 2018-04-06 – 2018-04-12 (×15): 1 via ORAL
  Filled 2018-04-06 (×15): qty 1

## 2018-04-06 MED ORDER — MORPHINE SULFATE (PF) 2 MG/ML IV SOLN
2.0000 mg | INTRAVENOUS | Status: DC | PRN
  Administered 2018-04-06 – 2018-04-12 (×24): 2 mg via INTRAVENOUS
  Filled 2018-04-06 (×25): qty 1

## 2018-04-06 MED ORDER — SODIUM CHLORIDE 0.9 % IV SOLN
6.0000 mg | Freq: Once | INTRAVENOUS | Status: AC
  Administered 2018-04-06: 6 mg via INTRAVENOUS
  Filled 2018-04-06: qty 4

## 2018-04-06 MED ORDER — ALLOPURINOL 100 MG PO TABS
100.0000 mg | ORAL_TABLET | Freq: Three times a day (TID) | ORAL | Status: DC
  Administered 2018-04-06 – 2018-04-12 (×17): 100 mg via ORAL
  Filled 2018-04-06 (×21): qty 1

## 2018-04-06 NOTE — Progress Notes (Addendum)
Hematology/Oncology Progress Note [facility] [phone] [phone]  Patient Care Team: Venia Carbon, MD as PCP - General (Internal Medicine)   Name of the patient: Mark Burns  [id number]  December 30, 1980  Date of visit: 04/06/18   INTERVAL HISTORY-  No acute overnight events.  Patient was seen in the bedside.  Creatinine function continued to improve. Uric acid was significantly elevated started on allopurinol.    Review of Systems  Constitutional: Positive for malaise/fatigue and weight loss.  HENT: Negative for ear discharge and nosebleeds.   Eyes: Negative for double vision and photophobia.  Respiratory: Negative for cough and shortness of breath.   Cardiovascular: Negative for palpitations and orthopnea.  Gastrointestinal: Negative for abdominal pain and melena.  Genitourinary: Positive for flank pain.  Musculoskeletal: Negative for falls.  Skin: Negative for rash.  Neurological: Negative for dizziness.  Psychiatric/Behavioral: Positive for depression. Negative for suicidal ideas.    Allergies  Allergen Reactions  . Sulfa Antibiotics Hives    Patient Active Problem List   Diagnosis Date Noted  . Acute kidney injury ([place]) 04/05/2018  . 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 Acute renal failure ([place])   . Enlarged prostate   . Abdominal mass, RUQ (right upper quadrant)   . Generalized abdominal pain   . At high risk of tumor lysis syndrome   . Pleuritic chest pain 01/07/2018  . Pneumococcal pneumonia ([place]) 01/07/2018  . Diffuse large B cell lymphoma ([place])   . Gout   . Effusion of right knee joint 05/05/2017  . Preventative health care 03/19/2016  . Chronic back pain 03/19/2016  . Recurrent cold sores   . Seborrheic dermatitis   . ADHD, predominantly inattentive type   . Generalized anxiety disorder   . Elevated LFTs 11/19/2013  . Erectile dysfunction   . Hypertension      Past Medical History:    Diagnosis Date  . ADHD, predominantly inattentive type   . Diffuse large B cell lymphoma ([place])   . Elevated LFTs 11/19/2013  . Erectile dysfunction   . Generalized anxiety disorder   . Gout   . Hypertension   . Recurrent cold sores   . Seborrheic dermatitis      Past Surgical History:  Procedure Laterality Date  . VASECTOMY  2014    Social History   Socioeconomic History  . Marital status: Married    Spouse name: Not on file  . Number of children: 2  . Years of education: Not on file  . Highest education level: Not on file  Occupational History  . Occupation: PE Pharmacist, hospital    Comment[employer]ensed [employer]  Social Needs  . Financial resource strain: Not on file  . Food insecurity:    Worry: Not on file    Inability: Not on file  . Transportation needs:    Medical: Not on file    Non-medical: Not on file  Tobacco Use  . Smoking status: Never Smoker  . Smokeless tobacco: Never Used  Substance and Sexual Activity  . Alcohol use: Yes    Comment: daily use   . Drug use: No  . Sexual activity: Not on file  Lifestyle  . Physical activity:    Days per week: Not on file    Minutes per session: Not on file  . Stress: Not on file  Relationships  . Social connections:    Talks on phone: Not on file    Gets together: Not on  file    Attends religious service: Not on file    Active member of club or organization: Not on file    Attends meetings of clubs or organizations: Not on file    Relationship status: Not on file  . Intimate partner violence:    Fear of current or ex partner: Not on file    Emotionally abused: Not on file    Physically abused: Not on file    Forced sexual activity: Not on file  Other Topics Concern  . Not on file  Social History Narrative   Played college football, married to "Judson Roch."      History reviewed. No pertinent family history.   Current Facility-Administered Medications:  .  0.9 %  sodium chloride infusion, , Intravenous,  Continuous, Earlie Server, MD, Last Rate: 125 mL/hr at 04/06/18 1017 .  acetaminophen (TYLENOL) tablet 650 mg, 650 mg, Oral, Q6H PRN **OR** acetaminophen (TYLENOL) suppository 650 mg, 650 mg, Rectal, Q6H PRN, Jodell Cipro, Prasanna, MD .  allopurinol (ZYLOPRIM) tablet 100 mg, 100 mg, Oral, TID, Earlie Server, MD, 100 mg at 04/06/18 1539 .  bisacodyl (DULCOLAX) EC tablet 5 mg, 5 mg, Oral, Daily PRN, Jodell Cipro, Prasanna, MD .  DULoxetine (CYMBALTA) DR capsule 90 mg, 90 mg, Oral, Daily, Sridharan, Prasanna, MD, 90 mg at 04/06/18 0944 .  heparin injection 5,000 Units, 5,000 Units, Subcutaneous, Q8H, Arta Silence, MD, 5,000 Units at 04/06/18 1340 .  LORazepam (ATIVAN) tablet 0.5-1 mg, 0.5-1 mg, Oral, QHS PRN, Jodell Cipro, Prasanna, MD .  morphine 2 MG/ML injection 2 mg, 2 mg, Intravenous, Q3H PRN **OR** [DISCONTINUED] morphine 4 MG/ML injection 4 mg, 4 mg, Intravenous, Q3H PRN, Arta Silence, MD, 4 mg at 04/06/18 0829 .  ondansetron (ZOFRAN) tablet 4 mg, 4 mg, Oral, Q6H PRN, 4 mg at 04/06/18 0947 **OR** ondansetron (ZOFRAN) injection 4 mg, 4 mg, Intravenous, Q6H PRN, Jodell Cipro, Prasanna, MD .  oxyCODONE-acetaminophen (PERCOCET/ROXICET) 5-325 MG per tablet 1 tablet, 1 tablet, Oral, Q6H PRN, Hillary Bow, MD, 1 tablet at 04/06/18 1340 .  senna-docusate (Senokot-S) tablet 1 tablet, 1 tablet, Oral, QHS PRN, Arta Silence, MD   Physical exam:  Vitals:   04/05/18 0744 04/05/18 1334 04/05/18 2300 04/06/18 0817  BP: 120/87 (!) 130/92 134/68 131/84  Pulse: 94 93 80 82  Resp: 20 20 18 18   Temp: 98.3 F (36.8 C) 98.5 F (36.9 C) 98.4 F (36.9 C) 98 F (36.7 C)  TempSrc:   Oral Oral  SpO2: 100% 99% 99% 93%  Weight:      Height:       Physical Exam  Constitutional: He is oriented to person, place, and time. No distress.  HENT:  Head: Normocephalic and atraumatic.  Eyes: Pupils are equal, round, and reactive to light. EOM are normal.  Neck: Normal range of motion. Neck supple.  Cardiovascular:  Normal rate.  No murmur heard. Pulmonary/Chest: Effort normal and breath sounds normal. No respiratory distress.  Abdominal: Soft. Bowel sounds are normal.  Musculoskeletal: Normal range of motion. He exhibits no edema.  Neurological: He is alert and oriented to person, place, and time.  Skin: Skin is warm and dry.  Psychiatric: Affect normal.       CMP Latest Ref Rng & Units 04/06/2018  Glucose 65 - 99 mg/dL 98  BUN 6 - 20 mg/dL 17  Creatinine 0.61 - 1.24 mg/dL 1.48(H)  Sodium 135 - 145 mmol/L 140  Potassium 3.5 - 5.1 mmol/L 3.9  Chloride 101 - 111 mmol/L 109  CO2 22 -  32 mmol/L 22  Calcium 8.9 - 10.3 mg/dL 8.3(L)  Total Protein 6.5 - 8.1 g/dL 5.7(L)  Total Bilirubin 0.3 - 1.2 mg/dL 0.6  Alkaline Phos 38 - 126 U/L 64  AST 15 - 41 U/L 35  ALT 17 - 63 U/L 29   CBC Latest Ref Rng & Units 04/06/2018  WBC 3.8 - 10.6 K/uL 2.7(L)  Hemoglobin 13.0 - 18.0 g/dL 11.0(L)  Hematocrit 40.0 - 52.0 % 30.8(L)  Platelets 150 - 440 K/uL 184   RADIOGRAPHIC STUDIES: I have personally reviewed the radiological images as listed and agreed with the findings in the report. US Renal  Result Date: 04/04/2018 CLINICAL DATA:  37 year old male with acute renal insufficiency. History of B-cell lymphoma. EXAM: RENAL / URINARY TRACT ULTRASOUND COMPLETE COMPARISON:  CT Abdomen and Pelvis 12/27/2017. FINDINGS: Right Kidney: Length: 13.9 centimeters. Echogenicity within normal limits. No mass or hydronephrosis visualized. Left Kidney: Length: 13.5 centimeters. There is mild hydronephrosis (image 31) which is new. Otherwise normal ultrasound appearance of the left kidney. Postvoid images of the left kidney appear unchanged (image 56). Bladder: Small bladder volume. Nonspecific mild to moderate bladder wall thickening (image 51). No urinary debris. Other findings: Trace free fluid about the liver, Morrison's pouch (image 6). IMPRESSION: 1. Mild left hydronephrosis is new since 12/27/2017, etiology unclear. There is  mild to moderate nonspecific bladder wall thickening. 2. The right kidney appears normal. 3. Small volume of nonspecific perihepatic free fluid/ascites. Electronically Signed   By: Genevie Ann M.D.   On: 04/04/2018 22:53   Ct Renal Stone Study  Result Date: 04/05/2018 CLINICAL DATA:  Abdominal pain pressure with urination, decreased urination EXAM: CT ABDOMEN AND PELVIS WITHOUT CONTRAST TECHNIQUE: Multidetector CT imaging of the abdomen and pelvis was performed following the standard protocol without IV contrast. COMPARISON:  Ultrasound 04/04/2018, CT 12/27/2017 FINDINGS: Lower chest: Lung bases demonstrate calcified left lower lobe granuloma. Calcified left infrahilar lymph nodes. Heart size within normal limits. Trace right pleural effusion. Hepatobiliary: No focal hepatic abnormality. Negative for biliary enlargement. Significant wall thickening of the gallbladder. No calcified stones. Masslike area measuring 5.5 x 4.1 cm at the falciform ligament. Pancreas: Unremarkable. No pancreatic ductal dilatation or surrounding inflammatory changes. Spleen: No focal abnormality.  Slightly enlarged at 15 cm. Adrenals/Urinary Tract: Adrenal glands are within normal limits. Mild left hydronephrosis. No ureteral stone. Diffuse bladder wall thickening with nodularity along the anterior wall on the left side. Stomach/Bowel: The stomach is nonenlarged. No dilated small bowel. No colon wall thickening. Vascular/Lymphatic: Mild aortic atherosclerosis. No aneurysmal dilatation. Slight increased size portal caval node measuring 22 mm compared with 15 mm previously. Reproductive: Interval marked enlargement of the prostate gland, now measuring 9.3 cm transverse by 6.3 cm AP. Other: No free air. Small amount of ascites within the abdomen and pelvis. Fluid infiltration along the mesentery. Diffuse nodular thickening of the anterior peritoneal cavity. Small amount of ascites around the liver and spleen. Suspected mass in the right upper  quadrant of the abdomen measuring 7.5 by 5.8 cm. Musculoskeletal: No acute or suspicious abnormality. IMPRESSION: 1. Mild left hydronephrosis and hydroureter, but no ureteral stone visible. 2. Interim development of diffuse infiltration and thickening of the peritoneal cavity, appearance of which suggests carcinomatosis or lymphomatosis, given history of prior lymphoma. Development of masslike region along the falciform ligament, in addition to a 7.5 cm right upper quadrant intraperitoneal mass, also suspicious for metastatic disease/possible lymphoma recurrence. 3. Diffuse gallbladder wall thickening, can also be seen in the setting of lymphoma.  Cholecystitis may also be considered. 4. Interval massive enlargement of the prostate, can also be seen in the setting of lymphoma. 5. Diffuse bladder wall thickening with masslike appearance along the left anterior wall. 6. Small right pleural effusion. Electronically Signed   By: Donavan Foil M.D.   On: 04/05/2018 00:34   US Abdomen Limited Ruq  Result Date: 04/05/2018 CLINICAL DATA:  Gallbladder wall thickening on CT. EXAM: ULTRASOUND ABDOMEN LIMITED RIGHT UPPER QUADRANT COMPARISON:  CT abdomen pelvis 04/04/2018 FINDINGS: Gallbladder: No gallstones are demonstrated. There is diffuse thickening and edema of the gallbladder wall with gallbladder wall thickness measuring up to 9 mm. Echogenic appearance of the bile suggesting sludge. Gallbladder wall thickening is nonspecific and could indicate acalculous cholecystitis, lymphoma, or may be related to liver disease, heart disease, or hypoproteinemia. Common bile duct: Diameter: 3 mm, normal Liver: No focal lesion identified. Within normal limits in parenchymal echogenicity. The mass like structure seen inferior to the liver is not identified specifically sonographically. Small amount of free fluid demonstrated around the liver. Portal vein is patent on color Doppler imaging with normal direction of blood flow towards  the liver. IMPRESSION: 1. Prominent diffuse thickening of the gallbladder wall with edema and gallbladder sludge. No stones identified. This appearance is nonspecific in could indicate acalculous cholecystitis, lymphoma, or changes due to liver disease, heart disease, or hypoproteinemia. 2. Small amount of free fluid around the liver. 3. Mass adjacent to the liver seen at CT is not identified sonographically for correlation. Electronically Signed   By: Lucienne Capers M.D.   On: 04/05/2018 02:37    Assessment and plan-  Patient is a 37 y.o. male with history of high-grade B-cell lymphoma, status post systemic chemotherapy plus intrathecal therapy, finished in January 2019 achieved complete response presented emergency room with worsening constitutional symptoms, bilateral flank pain and abdominal pain.  #Acute kidney injury, CT imaging and ultrasound independently reviewed by me likely post renal obstruction due to prostate enlargement.  Continue Foley catheter, aggressive IV fluid for hydration and monitor kidney function.  Today keep kidney function continue to improve decreased to 1.4.  Avoid nephrotoxins.  #Clinical lymphoma recurrence, labs reviewed.  Normal white count hemoglobin at baseline.  Mild lymphopenia.  Significant elevated LDH.  abnormal CT findings concerning for lymphoma recurrence.  Yesterday I have communicated with patient's oncologist at [facility] was made aware about patient current situation.  She will be in touch with patient and arrange follow-up appointments and a biopsy early next week.  Hold steroids for now which may obscure patient's diagnosis.   #Hyperuricemia, patient is at high risk of tumor lysis syndrome. Continue allopurinol [ 50% dose reduced due to AKI].   Aggressive IV hydration.  I also ordered 1 dose of rasburicase. Monitor uric acid daily as well as kidney function,, hopefully uric acid level continue to improve and patient be able to go home and  follow-up with [facility]  # Dr.Beaven requests that at discharge patient to bring CD with a copy of his CT image which may expedite his care at [facility].  I have talked to patient's RN Anderson Malta will help patient to obtain his image CD from medical records.  Thank you for allowing me to participate in the care of this patient.  I am off next week and Dr. Janese Banks who has seen patient in the past will follow  his inpatient course. Total face to face encounter time for this patient visit was 25 min. >50% of the time was  spent in counseling and coordination of care.   Earlie Server, MD, PhD Hematology Oncology [facility] Pager- [phone] 04/06/2018

## 2018-04-06 NOTE — Consult Note (Signed)
Urology Consult  Referring physician: Dr. Darvin Neighbours Reason for referral: hydronephrosis, enlarge prostate, urinary retention  Chief Complaint: suprapubic pain  History of Present Illness: Mark Burns is a 37yo with a hx of lymphoma admitted with urinary retention and acute renal failure. He has a hx of lymphoma diagnosed in 06/2017 and finsihed chemotherapy with complete response in 10/2017. He last CT in Feb 2019 showed no evidence of recurrance. He presented to the ER yesterday with worsening urine output and suprapubic pain. He underwent CT which showed likely recurrance of his lymphoma, ascites, and a >200g prostate which was normal in Feb 2019.  Creatinine was 2.36 on admission and is down to 1.48 after foley catheter placement. He made 1200cc of urine since admission yesterday. He continues to have dull intermittent mild suprapubic pain that was improved since foley catheter placement. For the past week he has noticed worsening urinary urgency, frequency and dribbling. No fevers/chills/sweats. No nausea/vomiting  Past Medical History:  Diagnosis Date  . ADHD, predominantly inattentive type   . Diffuse large B cell lymphoma ([place])   . Elevated LFTs 11/19/2013  . Erectile dysfunction   . Generalized anxiety disorder   . Gout   . Hypertension   . Recurrent cold sores   . Seborrheic dermatitis    Past Surgical History:  Procedure Laterality Date  . VASECTOMY  2014    Medications: I have reviewed the patient's current medications. Allergies:  Allergies  Allergen Reactions  . Sulfa Antibiotics Hives    History reviewed. No pertinent family history. Social History:  reports that he has never smoked. He has never used smokeless tobacco. He reports that he drinks alcohol. He reports that he does not use drugs.  Review of Systems  Gastrointestinal: Positive for abdominal pain.  Genitourinary: Positive for frequency and urgency.  All other systems reviewed and are negative.   Physical  Exam:  Vital signs in last 24 hours: Temp:  [98 F (36.7 C)-98.4 F (36.9 C)] 98 F (36.7 C) (06/23 0817) Pulse Rate:  [80-82] 82 (06/23 0817) Resp:  [18] 18 (06/23 0817) BP: (131-134)/(68-84) 131/84 (06/23 0817) SpO2:  [93 %-99 %] 93 % (06/23 0817) Physical Exam  Constitutional: He is oriented to person, place, and time. He appears well-developed and well-nourished.  HENT:  Head: Normocephalic and atraumatic.  Eyes: Pupils are equal, round, and reactive to light. EOM are normal.  Neck: Normal range of motion. No thyromegaly present.  Cardiovascular: Normal rate and regular rhythm.  Respiratory: Effort normal. No respiratory distress.  GI: Soft. He exhibits no distension. Hernia confirmed negative in the right inguinal area and confirmed negative in the left inguinal area.  Genitourinary: Testes normal and penis normal. Uncircumcised.  Musculoskeletal: Normal range of motion. He exhibits no edema.  Lymphadenopathy:       Right: No inguinal adenopathy present.       Left: No inguinal adenopathy present.  Neurological: He is alert and oriented to person, place, and time.  Skin: Skin is warm and dry.  Psychiatric: He has a normal mood and affect. His behavior is normal. Judgment and thought content normal.    Laboratory Data:  Results for orders placed or performed during the hospital encounter of 04/05/18 (from the past 72 hour(s))  CBC with Differential     Status: Abnormal   Collection Time: 04/04/18 11:20 PM  Result Value Ref Range   WBC 5.2 3.8 - 10.6 K/uL   RBC 3.80 (L) 4.40 - 5.90 MIL/uL   Hemoglobin 12.9 (  L) 13.0 - 18.0 g/dL    Comment: RESULT REPEATED AND VERIFIED   HCT 35.7 (L) 40.0 - 52.0 %    Comment: RESULT REPEATED AND VERIFIED   MCV 94.1 80.0 - 100.0 fL   MCH 34.0 26.0 - 34.0 pg   MCHC 36.2 (H) 32.0 - 36.0 g/dL   RDW 13.3 11.5 - 14.5 %   Platelets 251 150 - 440 K/uL   Neutrophils Relative % 78 %   Neutro Abs 4.1 1.4 - 6.5 K/uL   Lymphocytes Relative 7 %    Lymphs Abs 0.4 (L) 1.0 - 3.6 K/uL   Monocytes Relative 12 %   Monocytes Absolute 0.6 0.2 - 1.0 K/uL   Eosinophils Relative 2 %   Eosinophils Absolute 0.1 0 - 0.7 K/uL   Basophils Relative 1 %   Basophils Absolute 0.0 0 - 0.1 K/uL    Comment: Performed at [facility], [address] [place], [place] [address]  Basic metabolic panel     Status: Abnormal   Collection Time: 04/04/18 11:20 PM  Result Value Ref Range   Sodium 138 135 - 145 mmol/L   Potassium 3.4 (L) 3.5 - 5.1 mmol/L   Chloride 101 101 - 111 mmol/L   CO2 23 22 - 32 mmol/L   Glucose, Bld 122 (H) 65 - 99 mg/dL   BUN 22 (H) 6 - 20 mg/dL   Creatinine, Ser 2.21 (H) 0.61 - 1.24 mg/dL   Calcium 9.2 8.9 - 10.3 mg/dL   GFR calc non Af Amer 36 (L) >60 mL/min   GFR calc Af Amer 42 (L) >60 mL/min    Comment: (NOTE) The eGFR has been calculated using the CKD EPI equation. This calculation has not been validated in all clinical situations. eGFR's persistently <60 mL/min signify possible Chronic Kidney Disease.    Anion gap 14 5 - 15    Comment: Performed at [facility], [address] [place], [place] [address]  Hepatic function panel     Status: Abnormal   Collection Time: 04/04/18 11:20 PM  Result Value Ref Range   Total Protein 7.1 6.5 - 8.1 g/dL   Albumin 4.4 3.5 - 5.0 g/dL   AST 50 (H) 15 - 41 U/L   ALT 37 17 - 63 U/L   Alkaline Phosphatase 68 38 - 126 U/L   Total Bilirubin 0.9 0.3 - 1.2 mg/dL   Bilirubin, Direct 0.2 0.1 - 0.5 mg/dL   Indirect Bilirubin 0.7 0.3 - 0.9 mg/dL    Comment: Performed at [facility], [address] [place], [place] [address]  Lipase, blood     Status: None   Collection Time: 04/04/18 11:20 PM  Result Value Ref Range   Lipase 30 11 - 51 U/L    Comment: Performed at [facility], [address] [place], [place] [address]  CK     Status: None   Collection Time: 04/04/18 11:20 PM  Result Value Ref Range   Total CK 70 49 - 397 U/L    Comment:  Performed at [facility], [address] [place], [place] [address]  Magnesium     Status: Abnormal   Collection Time: 04/04/18 11:20 PM  Result Value Ref Range   Magnesium 1.5 (L) 1.7 - 2.4 mg/dL    Comment: Performed at [facility], [address], [place], [place] [address]  Phosphorus     Status: Abnormal   Collection Time: 04/04/18 11:20 PM  Result Value Ref  Range   Phosphorus 4.7 (H) 2.5 - 4.6 mg/dL    Comment: Performed at [facility], [address] [place], [place] [address]  Urinalysis, Complete w Microscopic     Status: Abnormal   Collection Time: 04/05/18 12:33 AM  Result Value Ref Range   Color, Urine YELLOW (A) YELLOW   APPearance HAZY (A) CLEAR   Specific Gravity, Urine 1.013 1.005 - 1.030   pH 5.0 5.0 - 8.0   Glucose, UA NEGATIVE NEGATIVE mg/dL   Hgb urine dipstick MODERATE (A) NEGATIVE   Bilirubin Urine NEGATIVE NEGATIVE   Ketones, ur NEGATIVE NEGATIVE mg/dL   Protein, ur 30 (A) NEGATIVE mg/dL   Nitrite NEGATIVE NEGATIVE   Leukocytes, UA NEGATIVE NEGATIVE   RBC / HPF 0-5 0 - 5 RBC/hpf   WBC, UA 0-5 0 - 5 WBC/hpf   Bacteria, UA RARE (A) NONE SEEN   Squamous Epithelial / LPF NONE SEEN 0 - 5   Mucus PRESENT    Hyaline Casts, UA PRESENT     Comment: Performed at [facility], [address] [place], [place] [address]  Protime-INR     Status: None   Collection Time: 04/05/18 12:57 AM  Result Value Ref Range   Prothrombin Time 14.1 11.4 - 15.2 seconds   INR 1.10     Comment: Performed at [facility], [address] [place], [place] [address]  APTT     Status: None   Collection Time: 04/05/18 12:57 AM  Result Value Ref Range   aPTT 29 24 - 36 seconds    Comment: Performed at [facility], [address] [place], [place] [address]  Comprehensive metabolic panel     Status: Abnormal   Collection Time: 04/05/18  3:29 PM  Result Value Ref Range   Sodium 136 135 - 145 mmol/L   Potassium 3.9 3.5 -  5.1 mmol/L   Chloride 105 101 - 111 mmol/L   CO2 21 (L) 22 - 32 mmol/L   Glucose, Bld 87 65 - 99 mg/dL   BUN 20 6 - 20 mg/dL   Creatinine, Ser 1.71 (H) 0.61 - 1.24 mg/dL   Calcium 8.2 (L) 8.9 - 10.3 mg/dL   Total Protein 6.0 (L) 6.5 - 8.1 g/dL   Albumin 3.9 3.5 - 5.0 g/dL   AST 44 (H) 15 - 41 U/L   ALT 34 17 - 63 U/L   Alkaline Phosphatase 66 38 - 126 U/L   Total Bilirubin 0.9 0.3 - 1.2 mg/dL   GFR calc non Af Amer 49 (L) >60 mL/min   GFR calc Af Amer 57 (L) >60 mL/min    Comment: (NOTE) The eGFR has been calculated using the CKD EPI equation. This calculation has not been validated in all clinical situations. eGFR's persistently <60 mL/min signify possible Chronic Kidney Disease.    Anion gap 10 5 - 15    Comment: Performed at [facility], [address] [place], [place] [address]  Uric acid     Status: Abnormal   Collection Time: 04/05/18  5:46 PM  Result Value Ref Range   Uric Acid, Serum 13.5 (H) 4.4 - 7.6 mg/dL    Comment: Performed at [facility], [address] [place], [place] [address]  Lactate dehydrogenase     Status: Abnormal   Collection Time: 04/05/18  5:46 PM  Result Value Ref Range   LDH 660 (H) 98 - 192 U/L    Comment: Performed at [facility],  [place], [place] [address]  Magnesium     Status: None   Collection Time: 04/06/18  5:33 AM  Result Value Ref Range   Magnesium 2.0 1.7 - 2.4 mg/dL    Comment: Performed at [facility], [address] [place], [place] [address]  CBC     Status: Abnormal   Collection Time: 04/06/18  5:33 AM  Result Value Ref Range   WBC 2.7 (L) 3.8 - 10.6 K/uL   RBC 3.27 (L) 4.40 - 5.90 MIL/uL   Hemoglobin 11.0 (L) 13.0 - 18.0 g/dL   HCT 30.8 (L) 40.0 - 52.0 %   MCV 94.2 80.0 - 100.0 fL   MCH 33.8 26.0 - 34.0 pg   MCHC 35.9 32.0 - 36.0 g/dL   RDW 13.1 11.5 - 14.5 %   Platelets 184 150 - 440 K/uL    Comment: Performed at [facility], [address]  [place], [place] [address]  Comprehensive metabolic panel     Status: Abnormal   Collection Time: 04/06/18  5:33 AM  Result Value Ref Range   Sodium 140 135 - 145 mmol/L   Potassium 3.9 3.5 - 5.1 mmol/L   Chloride 109 101 - 111 mmol/L   CO2 22 22 - 32 mmol/L   Glucose, Bld 98 65 - 99 mg/dL   BUN 17 6 - 20 mg/dL   Creatinine, Ser 1.48 (H) 0.61 - 1.24 mg/dL   Calcium 8.3 (L) 8.9 - 10.3 mg/dL   Total Protein 5.7 (L) 6.5 - 8.1 g/dL   Albumin 3.6 3.5 - 5.0 g/dL   AST 35 15 - 41 U/L   ALT 29 17 - 63 U/L   Alkaline Phosphatase 64 38 - 126 U/L   Total Bilirubin 0.6 0.3 - 1.2 mg/dL   GFR calc non Af Amer 58 (L) >60 mL/min   GFR calc Af Amer >60 >60 mL/min    Comment: (NOTE) The eGFR has been calculated using the CKD EPI equation. This calculation has not been validated in all clinical situations. eGFR's persistently <60 mL/min signify possible Chronic Kidney Disease.    Anion gap 9 5 - 15    Comment: Performed at [facility], [address] [place], [place] [address]  Uric acid     Status: Abnormal   Collection Time: 04/06/18 11:01 AM  Result Value Ref Range   Uric Acid, Serum 11.6 (H) 4.4 - 7.6 mg/dL    Comment: Performed at [facility], [address] [place], [place] [address]   No results found for this or any previous visit (from the past 240 hour(s)). 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 We will emperically start uroxatral 52m daily and attempt voiding trial as an outpatient but I am concerned he will be unable to void until he is treated for lymphoma. I discussed this with the patient and he has agreed to try alpha blocker therapy.  2.  Left hydronephrosis: I discussed the various causes of hydronephrosis with the patient. The mild hydronephrosis is likely related to bladder outlet obstruction versus malignant compression of his left ureter. Please order a renal [place] 48 hours to assess resolution of the left hydronephrosis following foley  catheter placement.  If the hydronephrosis persists the patient ay need ureteral stent placement versus nephrostomy tube placement prior to chemotherapy for lymphoma  Mark Burns 04/06/2018, 4:53 PM

## 2018-04-06 NOTE — Plan of Care (Signed)
  Problem: Education: Goal: Knowledge of General Education information will improve Outcome: Progressing   Problem: Health Behavior/Discharge Planning: Goal: Ability to manage health-related needs will improve Outcome: Progressing   Problem: Clinical Measurements: Goal: Ability to maintain clinical measurements within normal limits will improve Outcome: Progressing Goal: Diagnostic test results will improve Outcome: Progressing   Problem: Pain Managment: Goal: General experience of comfort will improve Outcome: Progressing   

## 2018-04-07 LAB — BASIC METABOLIC PANEL
ANION GAP: 11 (ref 5–15)
BUN: 21 mg/dL — ABNORMAL HIGH (ref 6–20)
CO2: 22 mmol/L (ref 22–32)
Calcium: 8.3 mg/dL — ABNORMAL LOW (ref 8.9–10.3)
Chloride: 107 mmol/L (ref 101–111)
Creatinine, Ser: 1.93 mg/dL — ABNORMAL HIGH (ref 0.61–1.24)
GFR, EST AFRICAN AMERICAN: 49 mL/min — AB (ref >60)
GFR, EST NON AFRICAN AMERICAN: 42 mL/min — AB (ref >60)
GLUCOSE: 87 mg/dL (ref 65–99)
Potassium: 4 mmol/L (ref 3.5–5.1)
Sodium: 140 mmol/L (ref 135–145)

## 2018-04-07 LAB — URIC ACID: URIC ACID, SERUM: 4.7 mg/dL (ref 4.4–7.6)

## 2018-04-07 NOTE — Progress Notes (Signed)
[place] [facility] [place] NAME: Mark Burns    MR#:  [id number]  DATE OF BIRTH:  1980/12/19  SUBJECTIVE:  CHIEF COMPLAINT:   No complaints, would like to go home REVIEW OF SYSTEMS:  CONSTITUTIONAL: No fever, fatigue or weakness.  EYES: No blurred or double vision.  EARS, NOSE, AND THROAT: No tinnitus or ear pain.  RESPIRATORY: No cough, shortness of breath, wheezing or hemoptysis.  CARDIOVASCULAR: No chest pain, orthopnea, edema.  GASTROINTESTINAL: No nausea, vomiting, diarrhea or abdominal pain.  GENITOURINARY: No dysuria, hematuria.  ENDOCRINE: No polyuria, nocturia,  HEMATOLOGY: No anemia, easy bruising or bleeding SKIN: No rash or lesion. MUSCULOSKELETAL: No joint pain or arthritis.   NEUROLOGIC: No tingling, numbness, weakness.  PSYCHIATRY: No anxiety or depression.   DRUG ALLERGIES:   Allergies  Allergen Reactions  . Sulfa Antibiotics Hives    VITALS:  Blood pressure (!) 141/87, pulse 87, temperature 98 F (36.7 C), temperature source Oral, resp. rate 16, height 6\' 2"  (1.88 m), weight 113.9 kg (251 lb), SpO2 97 %.  PHYSICAL EXAMINATION:  GENERAL:  37 y.o.-year-old patient lying in the bed with no acute distress.  EYES: Pupils equal, round, reactive to light and accommodation. No scleral icterus. Extraocular muscles intact.  HEENT: Head atraumatic, normocephalic. Oropharynx and nasopharynx clear.  NECK:  Supple, no jugular venous distention. No thyroid enlargement, no tenderness.  LUNGS: Normal breath sounds bilaterally, no wheezing, rales,rhonchi or crepitation. No use of accessory muscles of respiration.  CARDIOVASCULAR: S1, S2 normal. No murmurs, rubs, or gallops.  ABDOMEN: Soft, nontender, nondistended. Bowel sounds present. No organomegaly or mass.  EXTREMITIES: No pedal edema, cyanosis, or clubbing.  NEUROLOGIC: Cranial nerves II through XII are intact. Muscle strength 5/5 in all extremities. Sensation intact. Gait  not checked.  PSYCHIATRIC: The patient is alert and oriented x 3.  SKIN: No obvious rash, lesion, or ulcer.  LABORATORY PANEL:   CBC Recent Labs  Lab 04/06/18 0533  WBC 2.7*  HGB 11.0*  HCT 30.8*  PLT 184   ------------------------------------------------------------------------------------------------------------------  Chemistries  Recent Labs  Lab 04/06/18 0533 04/07/18 0335  NA 140 140  K 3.9 4.0  CL 109 107  CO2 22 22  GLUCOSE 98 87  BUN 17 21*  CREATININE 1.48* 1.93*  CALCIUM 8.3* 8.3*  MG 2.0  --   AST 35  --   ALT 29  --   ALKPHOS 64  --   BILITOT 0.6  --    ------------------------------------------------------------------------------------------------------------------  Cardiac Enzymes No results for input(s): TROPONINI in the last 168 hours. ------------------------------------------------------------------------------------------------------------------  RADIOLOGY:  No results found.  EKG:   Orders placed or performed during the hospital encounter of 12/26/17  . ED EKG 12-Lead  . ED EKG 12-Lead  . EKG 12-Lead  . EKG 12-Lead  . EKG    ASSESSMENT AND PLAN:   #Acute kidney injury-postrenal-with left-sided mild hydronephrosis and hydroureter probably from  prostate enlargement IV fluids, avoid nephrotoxins, Foley catheter - Creat at 1.93 (2.3 on admission)  * Left hydronephrosis: mild hydronephrosis is likely related to bladder outlet obstruction versus malignant compression of his left ureter. I've ordered a renal US for 48 hours to assess resolution of the left hydronephrosis following foley catheter placement.  If the hydronephrosis persists the patient may need ureteral stent placement versus nephrostomy tube placement prior to chemotherapy for lymphoma  #Clinical lymphoma recurrence - Significant elevated LDH.  abnormal CT findings concerning for lymphoma recurrence. Dr Janese Banks recommends transfer  to [facility] under Dr.Beaven  #Hyperuricemia,  patient is at high risk of tumor lysis syndrome. Continue allopurinol  - Aggressive IV hydration.Onco has given 1 dose of rasburicase y'day - Uric acid has normalized - Dr.Beaven requests that at discharge patient to bring CD with a copy of his CT image which may expedite his care at [facility].   #Hypokalemia and hypomagnesemia Replete and recheck   #transaminitis continue close monitoring  #History of gout no flareups at this time   DVT prophylaxis with heparin subcu  I've d/w [facility] transfer center and Dr Rolland Bimler is Accepting physician ([place]), will wait for beds   All the records are reviewed and case discussed with Care Management/Social Workerr. Management plans discussed with the patient, Dr Janese Banks and they are in agreement.  CODE STATUS: fc   TOTAL TIME TAKING CARE OF THIS PATIENT: 36 minutes.   POSSIBLE D/C IN 1  DAYS, DEPENDING ON CLINICAL CONDITION.  Note: This dictation was prepared with Dragon dictation along with smaller phrase technology. Any transcriptional errors that result from this process are unintentional.   Max Sane M.D on 04/07/2018 at 5:50 PM  Between 7am to 6pm - Pager - [phone] After 6pm go to [email or web address] - password [email or web address] [facility]  Office  [phone]  CC: Primary care physician; Venia Carbon, MD

## 2018-04-07 NOTE — Progress Notes (Signed)
Patient with complaints of bloating, asked if MD would drain fluid of stomach and if he would be transferred to [facility] for oncology to follow up, informed on coming nurse. Also reminded him to request CT scan on CD per urologist conversation.

## 2018-04-07 NOTE — Progress Notes (Addendum)
D/w Dr Janese Banks - Recommends transfer to [facility] under Dr Reita Chard [facility]).  I've started the process - but per transfer center, they're at or above their bed capacity so unlikely this will happen.  Dr Rolland Bimler is Accepting physician ([place]) when I talked to transfer center on their call back, will wait for beds  Dr Janese Banks aware.

## 2018-04-08 ENCOUNTER — Inpatient Hospital Stay: Payer: BC Managed Care – PPO

## 2018-04-08 DIAGNOSIS — R1900 Intra-abdominal and pelvic swelling, mass and lump, unspecified site: Secondary | ICD-10-CM

## 2018-04-08 DIAGNOSIS — C8513 Unspecified B-cell lymphoma, intra-abdominal lymph nodes: Secondary | ICD-10-CM

## 2018-04-08 LAB — BASIC METABOLIC PANEL
Anion gap: 9 (ref 5–15)
BUN: 19 mg/dL (ref 6–20)
CHLORIDE: 109 mmol/L (ref 98–111)
CO2: 22 mmol/L (ref 22–32)
Calcium: 8.6 mg/dL — ABNORMAL LOW (ref 8.9–10.3)
Creatinine, Ser: 1.71 mg/dL — ABNORMAL HIGH (ref 0.61–1.24)
GFR calc Af Amer: 57 mL/min — ABNORMAL LOW (ref >60)
GFR calc non Af Amer: 49 mL/min — ABNORMAL LOW (ref >60)
GLUCOSE: 87 mg/dL (ref 70–99)
POTASSIUM: 4 mmol/L (ref 3.5–5.1)
Sodium: 140 mmol/L (ref 135–145)

## 2018-04-08 LAB — CBC
HCT: 30.2 % — ABNORMAL LOW (ref 40.0–52.0)
Hemoglobin: 10.8 g/dL — ABNORMAL LOW (ref 13.0–18.0)
MCH: 33.6 pg (ref 26.0–34.0)
MCHC: 35.7 g/dL (ref 32.0–36.0)
MCV: 94.1 fL (ref 80.0–100.0)
Platelets: 166 10*3/uL (ref 150–440)
RBC: 3.21 MIL/uL — AB (ref 4.40–5.90)
RDW: 13.2 % (ref 11.5–14.5)
WBC: 2.4 10*3/uL — ABNORMAL LOW (ref 3.8–10.6)

## 2018-04-08 LAB — URIC ACID: Uric Acid, Serum: 1.3 mg/dL — ABNORMAL LOW (ref 3.7–8.6)

## 2018-04-08 LAB — LACTATE DEHYDROGENASE: LDH: 547 U/L — ABNORMAL HIGH (ref 98–192)

## 2018-04-08 LAB — PHOSPHORUS: Phosphorus: 3.3 mg/dL (ref 2.5–4.6)

## 2018-04-08 MED ORDER — ALPRAZOLAM 0.5 MG PO TABS
0.5000 mg | ORAL_TABLET | Freq: Three times a day (TID) | ORAL | Status: DC | PRN
  Administered 2018-04-08 – 2018-04-11 (×8): 0.5 mg via ORAL
  Filled 2018-04-08 (×8): qty 1

## 2018-04-08 NOTE — Progress Notes (Signed)
[place] [facility] [place] NAME: Mark Burns    MR#:  [id number]  DATE OF BIRTH:  [birth year]  SUBJECTIVE:  CHIEF COMPLAINT:   Not feeling well. Reports increasing fluid in lower abd REVIEW OF SYSTEMS:  CONSTITUTIONAL: No fever, fatigue or weakness.  EYES: No blurred or double vision.  EARS, NOSE, AND THROAT: No tinnitus or ear pain.  RESPIRATORY: No cough, shortness of breath, wheezing or hemoptysis.  CARDIOVASCULAR: No chest pain, orthopnea, edema.  GASTROINTESTINAL: No nausea, vomiting, diarrhea or abdominal pain.  GENITOURINARY: No dysuria, hematuria.  ENDOCRINE: No polyuria, nocturia,  HEMATOLOGY: No anemia, easy bruising or bleeding SKIN: No rash or lesion. MUSCULOSKELETAL: No joint pain or arthritis.   NEUROLOGIC: No tingling, numbness, weakness.  PSYCHIATRY: No anxiety or depression.   DRUG ALLERGIES:   Allergies  Allergen Reactions  . Sulfa Antibiotics Hives    VITALS:  Blood pressure (!) 145/87, pulse 79, temperature 98.4 F (36.9 C), temperature source Oral, resp. rate 16, height 6\' 2"  (1.88 m), weight 113.9 kg (251 lb), SpO2 96 %.  PHYSICAL EXAMINATION:  GENERAL:  37 y.o.-year-old patient lying in the bed with no acute distress.  EYES: Pupils equal, round, reactive to light and accommodation. No scleral icterus. Extraocular muscles intact.  HEENT: Head atraumatic, normocephalic. Oropharynx and nasopharynx clear.  NECK:  Supple, no jugular venous distention. No thyroid enlargement, no tenderness.  LUNGS: Normal breath sounds bilaterally, no wheezing, rales,rhonchi or crepitation. No use of accessory muscles of respiration.  CARDIOVASCULAR: S1, S2 normal. No murmurs, rubs, or gallops.  ABDOMEN: Soft, nontender, nondistended. Bowel sounds present. No organomegaly or mass.  EXTREMITIES: No pedal edema, cyanosis, or clubbing.  NEUROLOGIC: Cranial nerves II through XII are intact. Muscle strength 5/5 in all extremities.  Sensation intact. Gait not checked.  PSYCHIATRIC: The patient is alert and oriented x 3.  SKIN: No obvious rash, lesion, or ulcer.  LABORATORY PANEL:   CBC Recent Labs  Lab 04/08/18 0411  WBC 2.4*  HGB 10.8*  HCT 30.2*  PLT 166   ------------------------------------------------------------------------------------------------------------------  Chemistries  Recent Labs  Lab 04/06/18 0533  04/08/18 0411  NA 140   < > 140  K 3.9   < > 4.0  CL 109   < > 109  CO2 22   < > 22  GLUCOSE 98   < > 87  BUN 17   < > 19  CREATININE 1.48*   < > 1.71*  CALCIUM 8.3*   < > 8.6*  MG 2.0  --   --   AST 35  --   --   ALT 29  --   --   ALKPHOS 64  --   --   BILITOT 0.6  --   --    < > = values in this interval not displayed.   ------------------------------------------------------------------------------------------------------------------  Cardiac Enzymes No results for input(s): TROPONINI in the last 168 hours. ------------------------------------------------------------------------------------------------------------------  RADIOLOGY:  US Renal  Result Date: 04/08/2018 CLINICAL DATA:  Hydronephrosis EXAM: RENAL / URINARY TRACT ULTRASOUND COMPLETE COMPARISON:  04/04/2018 FINDINGS: Right Kidney: Length: 12.6 cm. Mild hydronephrosis is noted increased from the prior CT examination. Left Kidney: Length: 13.6 cm. Hydronephrosis is noted similar to that seen on prior CT examination. Bladder: Decompressed by Foley catheter. Mild ascites is noted similar to that seen on prior CT. IMPRESSION: Mild bilateral hydronephrosis somewhat more prominent on the right when compared with the prior CT. The left hydronephrosis is stable. Note is  made of ascites similar to that seen on prior CT. Some changes are noted in the abdominal cavity consistent with the omental disease seen on prior CT. Electronically Signed   By: Inez Catalina M.D.   On: 04/08/2018 09:43    EKG:   Orders placed or performed during  the hospital encounter of 12/26/17  . ED EKG 12-Lead  . ED EKG 12-Lead  . EKG 12-Lead  . EKG 12-Lead  . EKG    ASSESSMENT AND PLAN:   #Acute kidney injury-postrenal-with left-sided mild hydronephrosis and hydroureter probably from  prostate enlargement IV fluids, avoid nephrotoxins, Foley catheter - Creat at 1.7<-1.93 (2.3 on admission)  * Left hydronephrosis:  Renal US Today shows Mild bilateral hydronephrosis somewhat more prominent on the right when compared with the prior CT. The left hydronephrosis is stable - d/w Urology, they will reevaulate and decide need for nephrostomy tube vs stent, may need CT  #Clinical lymphoma recurrence - Significant elevated LDH.  abnormal CT findings concerning for lymphoma recurrence. Dr Janese Banks recommends transfer to [facility]  #Hyperuricemia, patient is at high risk of tumor lysis syndrome. Continue allopurinol  - Aggressive IV hydration.Onco has given 1 dose of rasburicase y'day - Uric acid has normalized - Dr.Beaven requests that at discharge patient to bring CD with a copy of his CT image which may expedite his care at [facility].   #Hypokalemia and hypomagnesemia Replete and recheck   #transaminitis continue close monitoring  #History of gout no flareups at this time   DVT prophylaxis with heparin subcu  I've d/w [facility] transfer center and Dr Rolland Bimler is Accepting physician (Onco), will wait for beds, still no beds. Updated Dr Janese Banks   All the records are reviewed and case discussed with Care Management/Social Workerr. Management plans discussed with the patient, Dr Janese Banks and they are in agreement.  CODE STATUS: FULL CODE  TOTAL TIME TAKING CARE OF THIS PATIENT: 36 minutes.   POSSIBLE D/C IN 1-2  DAYS, DEPENDING ON CLINICAL CONDITION. Urology and onco eval  Note: This dictation was prepared with Dragon dictation along with smaller phrase technology. Any transcriptional errors that result from this process are  unintentional.   Max Sane M.D on 04/08/2018 at 1:53 PM  Between 7am to 6pm - Pager - [phone] After 6pm go to [email or web address] - password [email or web address] [facility]  Office  [phone]  CC: Primary care physician; Venia Carbon, MD

## 2018-04-08 NOTE — Progress Notes (Signed)
Hematology/Oncology Consult note [facility]  [phone] [phone]  Patient Care Team: Venia Carbon, MD as PCP - General (Internal Medicine)   Name of the patient: Mark Burns  [id number]  07-15-1981   Date of visit: 04/08/2018  Interval history- abdomen feels tight and bloated. Denies other complaints. He was in tears yesterday and today at the thought of going through second round of treatment  ECOG PS- 0 Pain scale- 0   Review of systems- Review of Systems  Constitutional: Negative for chills, fever, malaise/fatigue and weight loss.  HENT: Negative for congestion, ear discharge and nosebleeds.   Eyes: Negative for blurred vision.  Respiratory: Negative for cough, hemoptysis, sputum production, shortness of breath and wheezing.   Cardiovascular: Negative for chest pain, palpitations, orthopnea and claudication.  Gastrointestinal: Positive for abdominal pain. Negative for blood in stool, constipation, diarrhea, heartburn, melena, nausea and vomiting.  Genitourinary: Negative for dysuria, flank pain, frequency, hematuria and urgency.  Musculoskeletal: Negative for back pain, joint pain and myalgias.  Skin: Negative for rash.  Neurological: Negative for dizziness, tingling, focal weakness, seizures, weakness and headaches.  Endo/Heme/Allergies: Does not bruise/bleed easily.  Psychiatric/Behavioral: Negative for depression and suicidal ideas. The patient does not have insomnia.       Allergies  Allergen Reactions  . Sulfa Antibiotics Hives     Past Medical History:  Diagnosis Date  . ADHD, predominantly inattentive type   . Diffuse large B cell lymphoma ([place])   . Elevated LFTs 11/19/2013  . Erectile dysfunction   . Generalized anxiety disorder   . Gout   . Hypertension   . Recurrent cold sores   . Seborrheic dermatitis      Past Surgical History:  Procedure Laterality Date  . VASECTOMY  2014    Social History    Socioeconomic History  . Marital status: Married    Spouse name: Not on file  . Number of children: 2  . Years of education: Not on file  . Highest education level: Not on file  Occupational History  . Occupation: PE Pharmacist, hospital    Comment[employer]ensed [employer] education [employer]  Social Needs  . Financial resource strain: Not on file  . Food insecurity:    Worry: Not on file    Inability: Not on file  . Transportation needs:    Medical: Not on file    Non-medical: Not on file  Tobacco Use  . Smoking status: Never Smoker  . Smokeless tobacco: Never Used  Substance and Sexual Activity  . Alcohol use: Yes    Comment: daily use   . Drug use: No  . Sexual activity: Not on file  Lifestyle  . Physical activity:    Days per week: Not on file    Minutes per session: Not on file  . Stress: Not on file  Relationships  . Social connections:    Talks on phone: Not on file    Gets together: Not on file    Attends religious service: Not on file    Active member of club or organization: Not on file    Attends meetings of clubs or organizations: Not on file    Relationship status: Not on file  . Intimate partner violence:    Fear of current or ex partner: Not on file    Emotionally abused: Not on file    Physically abused: Not on file    Forced sexual activity: Not on file  Other Topics Concern  .  Not on file  Social History Narrative   Played college football, married to "Judson Roch."     History reviewed. No pertinent family history.   Current Facility-Administered Medications:  .  0.9 %  sodium chloride infusion, , Intravenous, Continuous, Earlie Server, MD, Last Rate: 125 mL/hr at 04/07/18 2108 .  acetaminophen (TYLENOL) tablet 650 mg, 650 mg, Oral, Q6H PRN **OR** acetaminophen (TYLENOL) suppository 650 mg, 650 mg, Rectal, Q6H PRN, Jodell Cipro, Prasanna, MD .  alfuzosin (UROXATRAL) 24 hr tablet 10 mg, 10 mg, Oral, Q breakfast, McKenzie, Candee Furbish, MD, 10 mg at 04/08/18 1007 .  allopurinol (ZYLOPRIM)  tablet 100 mg, 100 mg, Oral, TID, Earlie Server, MD, 100 mg at 04/08/18 1006 .  ALPRAZolam Duanne Moron) tablet 0.5 mg, 0.5 mg, Oral, TID PRN, Max Sane, MD, 0.5 mg at 04/08/18 1341 .  bisacodyl (DULCOLAX) EC tablet 5 mg, 5 mg, Oral, Daily PRN, Jodell Cipro, Prasanna, MD .  DULoxetine (CYMBALTA) DR capsule 90 mg, 90 mg, Oral, Daily, Sridharan, Prasanna, MD, 90 mg at 04/08/18 1007 .  heparin injection 5,000 Units, 5,000 Units, Subcutaneous, Q8H, Arta Silence, MD, 5,000 Units at 04/08/18 0508 .  morphine 2 MG/ML injection 2 mg, 2 mg, Intravenous, Q3H PRN, 2 mg at 04/08/18 1341 **OR** [DISCONTINUED] morphine 4 MG/ML injection 4 mg, 4 mg, Intravenous, Q3H PRN, Arta Silence, MD, 4 mg at 04/06/18 0829 .  ondansetron (ZOFRAN) tablet 4 mg, 4 mg, Oral, Q6H PRN, 4 mg at 04/06/18 0947 **OR** ondansetron (ZOFRAN) injection 4 mg, 4 mg, Intravenous, Q6H PRN, Jodell Cipro, Prasanna, MD .  oxyCODONE-acetaminophen (PERCOCET/ROXICET) 5-325 MG per tablet 1 tablet, 1 tablet, Oral, Q6H PRN, Hillary Bow, MD, 1 tablet at 04/08/18 1015 .  senna-docusate (Senokot-S) tablet 1 tablet, 1 tablet, Oral, QHS PRN, Arta Silence, MD  Physical exam:  Vitals:   04/07/18 0753 04/07/18 1649 04/08/18 0008 04/08/18 0836  BP: 140/86 (!) 141/87 139/82 (!) 145/87  Pulse: 87 87 76 79  Resp:      Temp: 98.3 F (36.8 C) 98 F (36.7 C) 97.9 F (36.6 C) 98.4 F (36.9 C)  TempSrc: Oral Oral Oral Oral  SpO2: (!) 87% 97% 95% 96%  Weight:      Height:       Physical Exam  Constitutional: He is oriented to person, place, and time. He appears well-developed and well-nourished.  HENT:  Head: Normocephalic and atraumatic.  Eyes: Pupils are equal, round, and reactive to light. EOM are normal.  Neck: Normal range of motion.  Cardiovascular: Normal rate, regular rhythm and normal heart sounds.  Pulmonary/Chest: Effort normal and breath sounds normal.  Abdominal: Bowel sounds are normal.  Firm. No ascites. Foley catheter in place    Neurological: He is alert and oriented to person, place, and time.  Skin: Skin is warm and dry.     CMP Latest Ref Rng & Units 04/08/2018  Glucose 70 - 99 mg/dL 87  BUN 6 - 20 mg/dL 19  Creatinine 0.61 - 1.24 mg/dL 1.71(H)  Sodium 135 - 145 mmol/L 140  Potassium 3.5 - 5.1 mmol/L 4.0  Chloride 98 - 111 mmol/L 109  CO2 22 - 32 mmol/L 22  Calcium 8.9 - 10.3 mg/dL 8.6(L)  Total Protein 6.5 - 8.1 g/dL -  Total Bilirubin 0.3 - 1.2 mg/dL -  Alkaline Phos 38 - 126 U/L -  AST 15 - 41 U/L -  ALT 17 - 63 U/L -   CBC Latest Ref Rng & Units 04/08/2018  WBC 3.8 - 10.6 K/uL 2.4(L)  Hemoglobin 13.0 - 18.0 g/dL 10.8(L)  Hematocrit 40.0 - 52.0 % 30.2(L)  Platelets 150 - 440 K/uL 166    @IMAGES @  US Renal  Result Date: 04/08/2018 CLINICAL DATA:  Hydronephrosis EXAM: RENAL / URINARY TRACT ULTRASOUND COMPLETE COMPARISON:  04/04/2018 FINDINGS: Right Kidney: Length: 12.6 cm. Mild hydronephrosis is noted increased from the prior CT examination. Left Kidney: Length: 13.6 cm. Hydronephrosis is noted similar to that seen on prior CT examination. Bladder: Decompressed by Foley catheter. Mild ascites is noted similar to that seen on prior CT. IMPRESSION: Mild bilateral hydronephrosis somewhat more prominent on the right when compared with the prior CT. The left hydronephrosis is stable. Note is made of ascites similar to that seen on prior CT. Some changes are noted in the abdominal cavity consistent with the omental disease seen on prior CT. Electronically Signed   By: Inez Catalina M.D.   On: 04/08/2018 09:43   US Renal  Result Date: 04/04/2018 CLINICAL DATA:  37 year old male with acute renal insufficiency. History of B-cell lymphoma. EXAM: RENAL / URINARY TRACT ULTRASOUND COMPLETE COMPARISON:  CT Abdomen and Pelvis 12/27/2017. FINDINGS: Right Kidney: Length: 13.9 centimeters. Echogenicity within normal limits. No mass or hydronephrosis visualized. Left Kidney: Length: 13.5 centimeters. There is mild  hydronephrosis (image 31) which is new. Otherwise normal ultrasound appearance of the left kidney. Postvoid images of the left kidney appear unchanged (image 56). Bladder: Small bladder volume. Nonspecific mild to moderate bladder wall thickening (image 51). No urinary debris. Other findings: Trace free fluid about the liver, Morrison's pouch (image 6). IMPRESSION: 1. Mild left hydronephrosis is new since 12/27/2017, etiology unclear. There is mild to moderate nonspecific bladder wall thickening. 2. The right kidney appears normal. 3. Small volume of nonspecific perihepatic free fluid/ascites. Electronically Signed   By: Genevie Ann M.D.   On: 04/04/2018 22:53   Ct Renal Stone Study  Result Date: 04/05/2018 CLINICAL DATA:  Abdominal pain pressure with urination, decreased urination EXAM: CT ABDOMEN AND PELVIS WITHOUT CONTRAST TECHNIQUE: Multidetector CT imaging of the abdomen and pelvis was performed following the standard protocol without IV contrast. COMPARISON:  Ultrasound 04/04/2018, CT 12/27/2017 FINDINGS: Lower chest: Lung bases demonstrate calcified left lower lobe granuloma. Calcified left infrahilar lymph nodes. Heart size within normal limits. Trace right pleural effusion. Hepatobiliary: No focal hepatic abnormality. Negative for biliary enlargement. Significant wall thickening of the gallbladder. No calcified stones. Masslike area measuring 5.5 x 4.1 cm at the falciform ligament. Pancreas: Unremarkable. No pancreatic ductal dilatation or surrounding inflammatory changes. Spleen: No focal abnormality.  Slightly enlarged at 15 cm. Adrenals/Urinary Tract: Adrenal glands are within normal limits. Mild left hydronephrosis. No ureteral stone. Diffuse bladder wall thickening with nodularity along the anterior wall on the left side. Stomach/Bowel: The stomach is nonenlarged. No dilated small bowel. No colon wall thickening. Vascular/Lymphatic: Mild aortic atherosclerosis. No aneurysmal dilatation. Slight  increased size portal caval node measuring 22 mm compared with 15 mm previously. Reproductive: Interval marked enlargement of the prostate gland, now measuring 9.3 cm transverse by 6.3 cm AP. Other: No free air. Small amount of ascites within the abdomen and pelvis. Fluid infiltration along the mesentery. Diffuse nodular thickening of the anterior peritoneal cavity. Small amount of ascites around the liver and spleen. Suspected mass in the right upper quadrant of the abdomen measuring 7.5 by 5.8 cm. Musculoskeletal: No acute or suspicious abnormality. IMPRESSION: 1. Mild left hydronephrosis and hydroureter, but no ureteral stone visible. 2. Interim development of diffuse infiltration and thickening of the peritoneal  cavity, appearance of which suggests carcinomatosis or lymphomatosis, given history of prior lymphoma. Development of masslike region along the falciform ligament, in addition to a 7.5 cm right upper quadrant intraperitoneal mass, also suspicious for metastatic disease/possible lymphoma recurrence. 3. Diffuse gallbladder wall thickening, can also be seen in the setting of lymphoma. Cholecystitis may also be considered. 4. Interval massive enlargement of the prostate, can also be seen in the setting of lymphoma. 5. Diffuse bladder wall thickening with masslike appearance along the left anterior wall. 6. Small right pleural effusion. Electronically Signed   By: Donavan Foil M.D.   On: 04/05/2018 00:34   US Abdomen Limited Ruq  Result Date: 04/05/2018 CLINICAL DATA:  Gallbladder wall thickening on CT. EXAM: ULTRASOUND ABDOMEN LIMITED RIGHT UPPER QUADRANT COMPARISON:  CT abdomen pelvis 04/04/2018 FINDINGS: Gallbladder: No gallstones are demonstrated. There is diffuse thickening and edema of the gallbladder wall with gallbladder wall thickness measuring up to 9 mm. Echogenic appearance of the bile suggesting sludge. Gallbladder wall thickening is nonspecific and could indicate acalculous cholecystitis,  lymphoma, or may be related to liver disease, heart disease, or hypoproteinemia. Common bile duct: Diameter: 3 mm, normal Liver: No focal lesion identified. Within normal limits in parenchymal echogenicity. The mass like structure seen inferior to the liver is not identified specifically sonographically. Small amount of free fluid demonstrated around the liver. Portal vein is patent on color Doppler imaging with normal direction of blood flow towards the liver. IMPRESSION: 1. Prominent diffuse thickening of the gallbladder wall with edema and gallbladder sludge. No stones identified. This appearance is nonspecific in could indicate acalculous cholecystitis, lymphoma, or changes due to liver disease, heart disease, or hypoproteinemia. 2. Small amount of free fluid around the liver. 3. Mass adjacent to the liver seen at CT is not identified sonographically for correlation. Electronically Signed   By: Lucienne Capers M.D.   On: 04/05/2018 02:37     Assessment and plan- Patient is a 37 y.o. 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 Prostatomegaly, peritoneal thickening and mass lie area noted around falciform ligament all concerning for lymphoma recurrence. I have spoken to Dr. Josie Saunders over the phone yesterday and plan is to transfer the patient to [facility] and get repeat biopsy and treatment there. If he gets discharged before that, he will have a follow up with [facility] asap. Hold off on steroids at this time  2. TLS- hyperuricemia resolved after rasburicase. No evidence of TLS based on labs today. Monitor daily K, phos, ca and uric acid. Continue allopurinol  3. AKI - secondary to prostatomegaly and possible extrinsic compression of ureter. He is s/p foley. Urology following and he may get nephrostomy tube versus stent placement  4. Abdominal bloating and tightness is due to peritoneal thickening. Mild ascites noted on CT. I do not think that paracentesis will help these  symptoms  Will follow   Visit Diagnosis 1. Lymphoma of intra-abdominal lymph nodes, unspecified lymphoma type ([place])   2. Acute renal failure, unspecified acute renal failure type ([place])   3. Enlarged prostate   4. Abdominal mass, RUQ (right upper quadrant)   5. Thickening of wall of gallbladder   6. Generalized abdominal pain   7. At high risk of tumor lysis syndrome   8. Hydronephrosis      Dr. Randa Evens, MD, MPH [facility] [phone] 04/08/2018 2:18 PM

## 2018-04-09 ENCOUNTER — Inpatient Hospital Stay: Payer: BC Managed Care – PPO

## 2018-04-09 ENCOUNTER — Encounter: Payer: Self-pay | Admitting: Interventional Radiology

## 2018-04-09 DIAGNOSIS — N400 Enlarged prostate without lower urinary tract symptoms: Secondary | ICD-10-CM

## 2018-04-09 DIAGNOSIS — N1330 Unspecified hydronephrosis: Secondary | ICD-10-CM

## 2018-04-09 DIAGNOSIS — N179 Acute kidney failure, unspecified: Principal | ICD-10-CM

## 2018-04-09 DIAGNOSIS — R1084 Generalized abdominal pain: Secondary | ICD-10-CM

## 2018-04-09 HISTORY — PX: IR NEPHROSTOMY PLACEMENT LEFT: IMG6063

## 2018-04-09 HISTORY — PX: IR NEPHROSTOMY PLACEMENT RIGHT: IMG6064

## 2018-04-09 LAB — COMPREHENSIVE METABOLIC PANEL
ALBUMIN: 3.2 g/dL — AB (ref 3.5–5.0)
ALT: 24 U/L (ref 0–44)
AST: 36 U/L (ref 15–41)
Alkaline Phosphatase: 69 U/L (ref 38–126)
Anion gap: 8 (ref 5–15)
BUN: 18 mg/dL (ref 6–20)
CHLORIDE: 110 mmol/L (ref 98–111)
CO2: 20 mmol/L — AB (ref 22–32)
CREATININE: 2.59 mg/dL — AB (ref 0.61–1.24)
Calcium: 8.3 mg/dL — ABNORMAL LOW (ref 8.9–10.3)
GFR calc Af Amer: 34 mL/min — ABNORMAL LOW (ref >60)
GFR calc non Af Amer: 30 mL/min — ABNORMAL LOW (ref >60)
GLUCOSE: 77 mg/dL (ref 70–99)
POTASSIUM: 3.9 mmol/L (ref 3.5–5.1)
SODIUM: 138 mmol/L (ref 135–145)
Total Bilirubin: 0.8 mg/dL (ref 0.3–1.2)
Total Protein: 5.5 g/dL — ABNORMAL LOW (ref 6.5–8.1)

## 2018-04-09 LAB — CBC
HCT: 30.2 % — ABNORMAL LOW (ref 40.0–52.0)
Hemoglobin: 10.8 g/dL — ABNORMAL LOW (ref 13.0–18.0)
MCH: 33.9 pg (ref 26.0–34.0)
MCHC: 35.8 g/dL (ref 32.0–36.0)
MCV: 94.7 fL (ref 80.0–100.0)
PLATELETS: 162 10*3/uL (ref 150–440)
RBC: 3.19 MIL/uL — AB (ref 4.40–5.90)
RDW: 12.9 % (ref 11.5–14.5)
WBC: 2 10*3/uL — ABNORMAL LOW (ref 3.8–10.6)

## 2018-04-09 LAB — PROTIME-INR
INR: 1.01
Prothrombin Time: 13.2 seconds (ref 11.4–15.2)

## 2018-04-09 LAB — APTT: aPTT: 33 seconds (ref 24–36)

## 2018-04-09 MED ORDER — CIPROFLOXACIN IN D5W 400 MG/200ML IV SOLN
400.0000 mg | Freq: Two times a day (BID) | INTRAVENOUS | Status: DC
  Administered 2018-04-10: 400 mg via INTRAVENOUS
  Filled 2018-04-09 (×3): qty 200

## 2018-04-09 MED ORDER — LIDOCAINE HCL 1 % IJ SOLN
INTRAMUSCULAR | Status: AC | PRN
  Administered 2018-04-09: 10 mL via INTRADERMAL

## 2018-04-09 MED ORDER — CIPROFLOXACIN IN D5W 400 MG/200ML IV SOLN
INTRAVENOUS | Status: AC
  Administered 2018-04-09: 16:00:00
  Filled 2018-04-09: qty 200

## 2018-04-09 MED ORDER — FENTANYL CITRATE (PF) 100 MCG/2ML IJ SOLN
INTRAMUSCULAR | Status: AC
Start: 2018-04-09 — End: 2018-04-10
  Filled 2018-04-09: qty 2

## 2018-04-09 MED ORDER — MIDAZOLAM HCL 2 MG/2ML IJ SOLN
INTRAMUSCULAR | Status: AC
  Filled 2018-04-09: qty 2

## 2018-04-09 MED ORDER — MIDAZOLAM HCL 2 MG/2ML IJ SOLN
INTRAMUSCULAR | Status: AC | PRN
  Administered 2018-04-09 (×4): 1 mg via INTRAVENOUS

## 2018-04-09 MED ORDER — MIDAZOLAM HCL 2 MG/ML PO SYRP
ORAL_SOLUTION | ORAL | Status: AC
  Filled 2018-04-09: qty 4

## 2018-04-09 MED ORDER — FENTANYL CITRATE (PF) 100 MCG/2ML IJ SOLN
INTRAMUSCULAR | Status: AC
  Filled 2018-04-09: qty 2

## 2018-04-09 MED ORDER — LIDOCAINE HCL (PF) 1 % IJ SOLN
INTRAMUSCULAR | Status: AC
  Filled 2018-04-09: qty 30

## 2018-04-09 MED ORDER — MIDAZOLAM HCL 2 MG/ML PO SYRP
8.0000 mg | ORAL_SOLUTION | Freq: Once | ORAL | Status: AC
Start: 2018-04-09 — End: 2018-04-09
  Administered 2018-04-09: 8 mg via ORAL
  Filled 2018-04-09: qty 4

## 2018-04-09 MED ORDER — FENTANYL CITRATE (PF) 100 MCG/2ML IJ SOLN
INTRAMUSCULAR | Status: AC | PRN
  Administered 2018-04-09 (×4): 50 ug via INTRAVENOUS

## 2018-04-09 MED ORDER — IOPAMIDOL (ISOVUE-300) INJECTION 61%
15.0000 mL | Freq: Once | INTRAVENOUS | Status: AC | PRN
  Administered 2018-04-09: 15 mL via INTRAVENOUS

## 2018-04-09 NOTE — Progress Notes (Signed)
[facility] [place] NAME: Mark Burns    MR#:  [id number]  DATE OF BIRTH:  [birth year]  SUBJECTIVE:  Right flank pain improved Afebrile Good urine output  REVIEW OF SYSTEMS:  CONSTITUTIONAL: No fever, fatigue or weakness.  EYES: No blurred or double vision.  EARS, NOSE, AND THROAT: No tinnitus or ear pain.  RESPIRATORY: No cough, shortness of breath, wheezing or hemoptysis.  CARDIOVASCULAR: No chest pain, orthopnea, edema.  GASTROINTESTINAL: No nausea, vomiting, diarrhea or abdominal pain.  GENITOURINARY: No dysuria, hematuria.  ENDOCRINE: No polyuria, nocturia,  HEMATOLOGY: No anemia, easy bruising or bleeding SKIN: No rash or lesion. MUSCULOSKELETAL: No joint pain or arthritis.   NEUROLOGIC: No tingling, numbness, weakness.  PSYCHIATRY: No anxiety or depression.   DRUG ALLERGIES:   Allergies  Allergen Reactions  . Sulfa Antibiotics Hives    VITALS:  Blood pressure (!) 152/92, pulse 79, temperature 97.8 F (36.6 C), temperature source Oral, resp. rate 18, height 6\' 2"  (1.88 m), weight 113.9 kg (251 lb), SpO2 96 %.  PHYSICAL EXAMINATION:  GENERAL:  37 y.o.-year-old patient lying in the bed with no acute distress.  EYES: Pupils equal, round, reactive to light and accommodation. No scleral icterus. Extraocular muscles intact.  HEENT: Head atraumatic, normocephalic. Oropharynx and nasopharynx clear.  NECK:  Supple, no jugular venous distention. No thyroid enlargement, no tenderness.  LUNGS: Normal breath sounds bilaterally, no wheezing, rales,rhonchi or crepitation. No use of accessory muscles of respiration.  CARDIOVASCULAR: S1, S2 normal. No murmurs, rubs, or gallops.  ABDOMEN: Soft, nontender, nondistended. Bowel sounds present. No organomegaly or mass.  EXTREMITIES: No pedal edema, cyanosis, or clubbing.  NEUROLOGIC: Cranial nerves II through XII are intact. Muscle strength 5/5 in all extremities. Sensation intact. Gait  not checked.  PSYCHIATRIC: The patient is alert and oriented x 3.  SKIN: No obvious rash, lesion, or ulcer.    LABORATORY PANEL:   CBC Recent Labs  Lab 04/09/18 0655  WBC 2.0*  HGB 10.8*  HCT 30.2*  PLT 162   ------------------------------------------------------------------------------------------------------------------  Chemistries  Recent Labs  Lab 04/06/18 0533  04/09/18 0655  NA 140   < > 138  K 3.9   < > 3.9  CL 109   < > 110  CO2 22   < > 20*  GLUCOSE 98   < > 77  BUN 17   < > 18  CREATININE 1.48*   < > 2.59*  CALCIUM 8.3*   < > 8.3*  MG 2.0  --   --   AST 35  --  36  ALT 29  --  24  ALKPHOS 64  --  69  BILITOT 0.6  --  0.8   < > = values in this interval not displayed.   ------------------------------------------------------------------------------------------------------------------  Cardiac Enzymes No results for input(s): TROPONINI in the last 168 hours. ------------------------------------------------------------------------------------------------------------------  RADIOLOGY:  US Renal  Result Date: 04/08/2018 CLINICAL DATA:  Hydronephrosis EXAM: RENAL / URINARY TRACT ULTRASOUND COMPLETE COMPARISON:  04/04/2018 FINDINGS: Right Kidney: Length: 12.6 cm. Mild hydronephrosis is noted increased from the prior CT examination. Left Kidney: Length: 13.6 cm. Hydronephrosis is noted similar to that seen on prior CT examination. Bladder: Decompressed by Foley catheter. Mild ascites is noted similar to that seen on prior CT. IMPRESSION: Mild bilateral hydronephrosis somewhat more prominent on the right when compared with the prior CT. The left hydronephrosis is stable. Note is made of ascites similar to that seen on prior CT.  Some changes are noted in the abdominal cavity consistent with the omental disease seen on prior CT. Electronically Signed   By: Inez Catalina M.D.   On: 04/08/2018 09:43   Ct Renal Stone Study  Result Date: 04/08/2018 CLINICAL DATA:   Inpatient. 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 Follow-up hydronephrosis. EXAM: CT ABDOMEN AND PELVIS WITHOUT CONTRAST TECHNIQUE: Multidetector CT imaging of the abdomen and pelvis was performed following the standard protocol without IV contrast. COMPARISON:  04/04/2018 CT abdomen/pelvis. 04/08/2018 renal sonogram. FINDINGS: Lower chest: Small dependent right pleural effusion is increased. Trace dependent left pleural effusion is new. Stable coarsely calcified granulomatous left hilar nodes and left lower lobe subcentimeter granuloma. Dependent bibasilar atelectasis, moderate on the right and mild on the left, increased bilaterally. Hepatobiliary: Normal liver size. Redemonstration of solid 6.0 x 4.1 cm mass in the intersegmental fissure (series 2/image 28), previously 5.5 x 4.1 cm, slightly increased. Nondistended gallbladder. Stable diffuse gallbladder wall thickening. Poorly marginated 8.1 x 7.2 cm solid soft tissue mass in the right upper quadrant abutting the inferior gallbladder (series 2/image 45), increased from 7.5 x 5.8 cm. No radiopaque cholelithiasis. No biliary ductal dilatation. Pancreas: Normal, with no mass or duct dilation. Spleen: New mild splenomegaly (craniocaudal splenic length 14.3 cm, increased from 12.3 cm). No discrete splenic mass. Adrenals/Urinary Tract: Normal adrenals. Mild bilateral hydroureteronephrosis, worsened on the right and stable on the left since 04/04/2018 CT. No renal or ureteral stones. No contour deforming renal mass. Bladder collapsed by indwelling Foley catheter. Stable diffuse bladder wall thickening with stable 3.0 cm anterior left bladder wall mass (series 2/image 96). Stomach/Bowel: Normal non-distended stomach. Normal caliber small bowel with no small bowel wall thickening. Normal appendix. Normal large bowel with no diverticulosis, large bowel wall thickening or pericolonic fat stranding.  Vascular/Lymphatic: Atherosclerotic nonaneurysmal abdominal aorta. No pathologically enlarged lymph nodes in the abdomen or pelvis. Reproductive: Massively enlarged prostate measuring 10.1 cm in width, increased from 9.7 cm. Fat stranding surrounding enlarged prostate, which encases the distal pelvic ureters bilaterally. Other: No pneumoperitoneum. Soft tissue caking throughout the omentum appears subjectively slightly increased. Small to moderate volume ascites, increased. Diffuse thickening of the mesenteric folds and parietal peritoneum, most apparent in the right lower quadrant (series 2/image 86). Musculoskeletal: No aggressive appearing focal osseous lesions. Moderate thoracolumbar spondylosis. IMPRESSION: 1. Progression of disease in the short interval since 04/04/2018 CT, suggesting an aggressive process, probably recurrent lymphoma. Right upper quadrant mass abutting the inferior gallbladder and mass in the intersegmental fissure of the left liver lobe have increased in size. Soft tissue caking throughout the omentum and diffuse peritoneal thickening have increased. Small to moderate volume ascites, increased. Massive prostate enlargement, increased. Stable left anterior bladder mass. New mild splenomegaly. 2. 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 3. Increased small dependent right pleural effusion. New trace dependent left pleural effusion. Worsened dependent bibasilar atelectasis. Electronically Signed   By: Ilona Sorrel M.D.   On: 04/08/2018 18:41    EKG:   Orders placed or performed during the hospital encounter of 12/26/17  . ED EKG 12-Lead  . ED EKG 12-Lead  . EKG 12-Lead  . EKG 12-Lead  . EKG    ASSESSMENT AND PLAN:   # Elevated uric acid On allopurinol Waiting for repeat levels today  #Acute kidney injury-postrenal-with left-sided  mild hydronephrosis and  hydroureter probably from  prostate enlargement IV fluids, avoid nephrotoxins, Foley catheter in place Urology consult placed Repeat a.m. 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 Management plans discussed with the patient, family and they are in agreement.  CODE STATUS: FULL CODE  TOTAL TIME TAKING CARE OF THIS PATIENT: 36 minutes.   POSSIBLE D/C IN 1-2  DAYS, DEPENDING ON CLINICAL CONDITION.  Note: This dictation was prepared with Dragon dictation along with smaller phrase technology. Any transcriptional errors that result from this process are unintentional.   Neita Carp M.D on 04/09/2018 at 10:37 AM  Between 7am to 6pm - Pager - [phone]  After 6pm go to [email or web address] - password [email or web address] [facility]  Office  [phone]  CC: Primary care physician; Venia Carbon, MD

## 2018-04-09 NOTE — Progress Notes (Signed)
[facility] [place] NAME: Mark Burns    MR#:  [id number]  DATE OF BIRTH:  [birth year]  SUBJECTIVE:  CHIEF COMPLAINT:   Abdominal discomfort.  On Foley catheter with clear urine. REVIEW OF SYSTEMS:  CONSTITUTIONAL: No fever, fatigue or weakness.  EYES: No blurred or double vision.  EARS, NOSE, AND THROAT: No tinnitus or ear pain.  RESPIRATORY: No cough, shortness of breath, wheezing or hemoptysis.  CARDIOVASCULAR: No chest pain, orthopnea, edema.  GASTROINTESTINAL: No nausea, vomiting, diarrhea or abdominal pain.  GENITOURINARY: No dysuria, hematuria.  ENDOCRINE: No polyuria, nocturia,  HEMATOLOGY: No anemia, easy bruising or bleeding SKIN: No rash or lesion. MUSCULOSKELETAL: No joint pain or arthritis.   NEUROLOGIC: No tingling, numbness, weakness.  PSYCHIATRY: No anxiety or depression.   DRUG ALLERGIES:   Allergies  Allergen Reactions  . Sulfa Antibiotics Hives    VITALS:  Blood pressure (!) 167/106, pulse (!) 101, temperature 98.3 F (36.8 C), temperature source Oral, resp. rate 18, height 6\' 2"  (1.88 m), weight 251 lb (113.9 kg), SpO2 93 %.  PHYSICAL EXAMINATION:  GENERAL:  37 y.o.-year-old patient lying in the bed with no acute distress.  Obesity. EYES: Pupils equal, round, reactive to light and accommodation. No scleral icterus. Extraocular muscles intact.  HEENT: Head atraumatic, normocephalic. Oropharynx and nasopharynx clear.  NECK:  Supple, no jugular venous distention. No thyroid enlargement, no tenderness.  LUNGS: Normal breath sounds bilaterally, no wheezing, rales,rhonchi or crepitation. No use of accessory muscles of respiration.  CARDIOVASCULAR: S1, S2 normal. No murmurs, rubs, or gallops.  ABDOMEN: Soft, nontender, nondistended. Bowel sounds present. No organomegaly or mass.  EXTREMITIES: No pedal edema, cyanosis, or clubbing.  NEUROLOGIC: Cranial nerves II through XII are intact. Muscle strength 5/5 in  all extremities. Sensation intact. Gait not checked.  PSYCHIATRIC: The patient is alert and oriented x 3.  SKIN: No obvious rash, lesion, or ulcer.  LABORATORY PANEL:   CBC Recent Labs  Lab 04/09/18 0655  WBC 2.0*  HGB 10.8*  HCT 30.2*  PLT 162   ------------------------------------------------------------------------------------------------------------------  Chemistries  Recent Labs  Lab 04/06/18 0533  04/09/18 0655  NA 140   < > 138  K 3.9   < > 3.9  CL 109   < > 110  CO2 22   < > 20*  GLUCOSE 98   < > 77  BUN 17   < > 18  CREATININE 1.48*   < > 2.59*  CALCIUM 8.3*   < > 8.3*  MG 2.0  --   --   AST 35  --  36  ALT 29  --  24  ALKPHOS 64  --  69  BILITOT 0.6  --  0.8   < > = values in this interval not displayed.   ------------------------------------------------------------------------------------------------------------------  Cardiac Enzymes No results for input(s): TROPONINI in the last 168 hours. ------------------------------------------------------------------------------------------------------------------  RADIOLOGY:  [place] Intraoperative  Result Date: 04/09/2018 CLINICAL DATA:  Ultrasound was provided for use by the ordering physician, and a technical charge was applied by the performing facility.  No radiologist interpretation/professional services rendered.   US Renal  Result Date: 04/08/2018 CLINICAL DATA:  Hydronephrosis EXAM: RENAL / URINARY TRACT ULTRASOUND COMPLETE COMPARISON:  04/04/2018 FINDINGS: Right Kidney: Length: 12.6 cm. Mild hydronephrosis is noted increased from the prior CT examination. Left Kidney: Length: 13.6 cm. Hydronephrosis is noted similar to that seen on prior CT examination. Bladder: Decompressed by Foley catheter. Mild ascites is noted  similar to that seen on prior CT. IMPRESSION: Mild bilateral hydronephrosis somewhat more prominent on the right when compared with the prior CT. The left hydronephrosis is stable. Note is made of  ascites similar to that seen on prior CT. Some changes are noted in the abdominal cavity consistent with the omental disease seen on prior CT. Electronically Signed   By: Inez Catalina M.D.   On: 04/08/2018 09:43   Ct Renal Stone Study  Result Date: 04/08/2018 CLINICAL DATA:  Inpatient. 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 Follow-up hydronephrosis. EXAM: CT ABDOMEN AND PELVIS WITHOUT CONTRAST TECHNIQUE: Multidetector CT imaging of the abdomen and pelvis was performed following the standard protocol without IV contrast. COMPARISON:  04/04/2018 CT abdomen/pelvis. 04/08/2018 renal sonogram. FINDINGS: Lower chest: Small dependent right pleural effusion is increased. Trace dependent left pleural effusion is new. Stable coarsely calcified granulomatous left hilar nodes and left lower lobe subcentimeter granuloma. Dependent bibasilar atelectasis, moderate on the right and mild on the left, increased bilaterally. Hepatobiliary: Normal liver size. Redemonstration of solid 6.0 x 4.1 cm mass in the intersegmental fissure (series 2/image 28), previously 5.5 x 4.1 cm, slightly increased. Nondistended gallbladder. Stable diffuse gallbladder wall thickening. Poorly marginated 8.1 x 7.2 cm solid soft tissue mass in the right upper quadrant abutting the inferior gallbladder (series 2/image 45), increased from 7.5 x 5.8 cm. No radiopaque cholelithiasis. No biliary ductal dilatation. Pancreas: Normal, with no mass or duct dilation. Spleen: New mild splenomegaly (craniocaudal splenic length 14.3 cm, increased from 12.3 cm). No discrete splenic mass. Adrenals/Urinary Tract: Normal adrenals. Mild bilateral hydroureteronephrosis, worsened on the right and stable on the left since 04/04/2018 CT. No renal or ureteral stones. No contour deforming renal mass. Bladder collapsed by indwelling Foley catheter. Stable diffuse bladder wall thickening with stable 3.0 cm  anterior left bladder wall mass (series 2/image 96). Stomach/Bowel: Normal non-distended stomach. Normal caliber small bowel with no small bowel wall thickening. Normal appendix. Normal large bowel with no diverticulosis, large bowel wall thickening or pericolonic fat stranding. Vascular/Lymphatic: Atherosclerotic nonaneurysmal abdominal aorta. No pathologically enlarged lymph nodes in the abdomen or pelvis. Reproductive: Massively enlarged prostate measuring 10.1 cm in width, increased from 9.7 cm. Fat stranding surrounding enlarged prostate, which encases the distal pelvic ureters bilaterally. Other: No pneumoperitoneum. Soft tissue caking throughout the omentum appears subjectively slightly increased. Small to moderate volume ascites, increased. Diffuse thickening of the mesenteric folds and parietal peritoneum, most apparent in the right lower quadrant (series 2/image 86). Musculoskeletal: No aggressive appearing focal osseous lesions. Moderate thoracolumbar spondylosis. IMPRESSION: 1. Progression of disease in the short interval since 04/04/2018 CT, suggesting an aggressive process, probably recurrent lymphoma. Right upper quadrant mass abutting the inferior gallbladder and mass in the intersegmental fissure of the left liver lobe have increased in size. Soft tissue caking throughout the omentum and diffuse peritoneal thickening have increased. Small to moderate volume ascites, increased. Massive prostate enlargement, increased. Stable left anterior bladder mass. New mild splenomegaly. 2. 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 3. Increased small dependent right pleural effusion. New trace dependent left pleural effusion. Worsened dependent bibasilar atelectasis. Electronically Signed   By: Ilona Sorrel M.D.   On: 04/08/2018 18:41   Ir Nephrostomy Placement  Left  Result Date: 04/09/2018 CLINICAL DATA:  Diffuse large B-cell lymphoma, prostatic enlargement. Mild bilateral hydroureteronephrosis, worsened on  the right and stable on the left, probably due to extrinsic neoplastic obstruction at the level of the distal pelvic ureters bilaterally. worsening renal function. Percutaneous drainage requested. EXAM: BILATERAL PERCUTANEOUS NEPHROSTOMY CATHETER PLACEMENT UNDER ULTRASOUND AND FLUOROSCOPIC GUIDANCE FLUOROSCOPY TIME:  1.8 minutes; 103 mGy TECHNIQUE: The procedure, risks (including but not limited to bleeding, infection, organ damage ), benefits, and alternatives were explained to the patient. Questions regarding the procedure were encouraged and answered. The patient understands and consents to the procedure. Bilateral flank regions prepped , draped in usual sterile fashion, infiltrated locally with 1% lidocaine. As antibiotic prophylaxis, Cipro 400 mg was ordered pre-procedure and administered intravenously within one hour of incision. Intravenous Fentanyl and Versed were administered as conscious sedation during continuous monitoring of the patient's level of consciousness and physiological / cardiorespiratory status by the radiology RN, with a total moderate sedation time of 30 minutes. Under real-time ultrasound guidance, a 21-gauge trocar needle was advanced into a posterior lower pole calyx of the right kidney. Ultrasound image documentation was saved. Urine spontaneously returned through the needle. Needle was exchanged over a guidewire for transitional dilator. Contrast injection confirmed appropriate positioning. Catheter was exchanged over a guidewire for a 10 French pigtail catheter, formed centrally within the right renal collecting system. Contrast injection confirms appropriate positioning and patency. In similar fashion, Under real-time ultrasound guidance, a 21-gauge trocar needle was advanced into a posterior lower pole calyx of the left kidney.  Ultrasound image documentation was saved. Urine spontaneously returned through the needle. Needle was exchanged over a guidewire for transitional dilator. Contrast injection confirmed appropriate positioning. Catheter was exchanged over a guidewire for a 10 French pigtail catheter, formed centrally within the left renal collecting system. Contrast injection confirms appropriate positioning and patency. Both catheters secured externally with 0 Prolene suture and StatLock, and placed to external drain bag. The patient tolerated the procedure well. COMPLICATIONS: COMPLICATIONS none IMPRESSION: 1. Technically successful bilateral percutaneous nephrostomy catheter placement. Electronically Signed   By: Lucrezia Europe M.D.   On: 04/09/2018 16:21   Ir Nephrostomy Placement Right  Result Date: 04/09/2018 CLINICAL DATA:  Diffuse large B-cell lymphoma, prostatic enlargement. 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 worsening renal function. Percutaneous drainage requested. EXAM: BILATERAL PERCUTANEOUS NEPHROSTOMY CATHETER PLACEMENT UNDER ULTRASOUND AND FLUOROSCOPIC GUIDANCE FLUOROSCOPY TIME:  1.8 minutes; 103 mGy TECHNIQUE: The procedure, risks (including but not limited to bleeding, infection, organ damage ), benefits, and alternatives were explained to the patient. Questions regarding the procedure were encouraged and answered. The patient understands and consents to the procedure. Bilateral flank regions prepped , draped in usual sterile fashion, infiltrated locally with 1% lidocaine. As antibiotic prophylaxis, Cipro 400 mg was ordered pre-procedure and administered intravenously within one hour of incision. Intravenous Fentanyl and Versed were administered as conscious sedation during continuous monitoring of the patient's level of consciousness and physiological /  cardiorespiratory status by the radiology RN, with a total moderate sedation time of 30 minutes. Under real-time ultrasound guidance, a 21-gauge trocar needle was advanced into a posterior lower pole calyx of the right kidney. Ultrasound image documentation was saved. Urine spontaneously returned through the needle. Needle was exchanged over a guidewire for transitional dilator. Contrast injection confirmed appropriate positioning. Catheter was exchanged over a guidewire for a 10 French pigtail catheter, formed centrally within the right renal collecting system. Contrast injection confirms appropriate positioning and patency. In similar fashion,  Under real-time ultrasound guidance, a 21-gauge trocar needle was advanced into a posterior lower pole calyx of the left kidney. Ultrasound image documentation was saved. Urine spontaneously returned through the needle. Needle was exchanged over a guidewire for transitional dilator. Contrast injection confirmed appropriate positioning. Catheter was exchanged over a guidewire for a 10 French pigtail catheter, formed centrally within the left renal collecting system. Contrast injection confirms appropriate positioning and patency. Both catheters secured externally with 0 Prolene suture and StatLock, and placed to external drain bag. The patient tolerated the procedure well. COMPLICATIONS: COMPLICATIONS none IMPRESSION: 1. Technically successful bilateral percutaneous nephrostomy catheter placement. Electronically Signed   By: Lucrezia Europe M.D.   On: 04/09/2018 16:21    EKG:   Orders placed or performed during the hospital encounter of 12/26/17  . ED EKG 12-Lead  . ED EKG 12-Lead  . EKG 12-Lead  . EKG 12-Lead  . EKG    ASSESSMENT AND PLAN:   #Acute kidney injury-postrenal-with left-sided mild hydronephrosis and hydroureter probably from  prostate enlargement IV fluids, avoid nephrotoxins, Foley catheter Worsening creatinine.  Follow-up BMP.  * Left  hydronephrosis:  Renal US Today shows Mild bilateral hydronephrosis somewhat more prominent on the right when compared with the prior CT. The left hydronephrosis is stable Per urologist, nephrostomy tube vs stent today.  #Clinical lymphoma recurrence - Significant elevated LDH.  abnormal CT findings concerning for lymphoma recurrence. Dr Janese Banks recommends transfer to [facility] under [facility]  #Hyperuricemia, patient is at high risk of tumor lysis syndrome. Continue allopurinol  - Aggressive IV hydration.Onco has given 1 dose of rasburicase y'day - Uric acid has normalized - Dr.Beaven requests that at discharge patient to bring CD with a copy of his CT image which may expedite his care at [facility].   #Hypokalemia and hypomagnesemia Replete and recheck   #transaminitis continue close monitoring  #History of gout no flareups at this time  DVT prophylaxis with heparin subcu  Still waiting for transfer to [facility].  All the records are reviewed and case discussed with Care Management/Social Workerr. Management plans discussed with the patient, Dr Janese Banks and they are in agreement.  CODE STATUS: FULL CODE  TOTAL TIME TAKING CARE OF THIS PATIENT: 35 minutes.   POSSIBLE D/C IN 1-2  DAYS, DEPENDING ON CLINICAL CONDITION. Urology and onco eval  Note: This dictation was prepared with Dragon dictation along with smaller phrase technology. Any transcriptional errors that result from this process are unintentional.   Demetrios Loll M.D on 04/09/2018 at 5:01 PM  Between 7am to 6pm - Pager - [phone] After 6pm go to [email or web address] - password [email or web address] [place] [facility]  Office  [phone]  CC: Primary care physician; Venia Carbon, MD

## 2018-04-09 NOTE — Progress Notes (Signed)
Urology Consult Follow Up  Subjective: Patient's creatinine up to 2.59 from 1.93.  He is sitting on the toilet as it helps with perineal discomfort.   Anti-infectives: Anti-infectives (From admission, onward)   None      Current Facility-Administered Medications  Medication Dose Route Frequency Provider Last Rate Last Dose  . 0.9 %  sodium chloride infusion   Intravenous Continuous Earlie Server, MD 125 mL/hr at 04/09/18 0504    . acetaminophen (TYLENOL) tablet 650 mg  650 mg Oral Q6H PRN Arta Silence, MD   650 mg at 04/09/18 0458   Or  . acetaminophen (TYLENOL) suppository 650 mg  650 mg Rectal Q6H PRN Arta Silence, MD      . alfuzosin (UROXATRAL) 24 hr tablet 10 mg  10 mg Oral Q breakfast McKenzie, Candee Furbish, MD   10 mg at 04/08/18 1007  . allopurinol (ZYLOPRIM) tablet 100 mg  100 mg Oral TID Earlie Server, MD   100 mg at 04/08/18 2100  . ALPRAZolam Duanne Moron) tablet 0.5 mg  0.5 mg Oral TID PRN Max Sane, MD   0.5 mg at 04/08/18 2027  . bisacodyl (DULCOLAX) EC tablet 5 mg  5 mg Oral Daily PRN Arta Silence, MD      . DULoxetine (CYMBALTA) DR capsule 90 mg  90 mg Oral Daily Arta Silence, MD   90 mg at 04/08/18 1007  . heparin injection 5,000 Units  5,000 Units Subcutaneous Q8H Arta Silence, MD   5,000 Units at 04/09/18 0500  . morphine 2 MG/ML injection 2 mg  2 mg Intravenous Q3H PRN Hillary Bow, MD   2 mg at 04/08/18 2236  . ondansetron (ZOFRAN) tablet 4 mg  4 mg Oral Q6H PRN Arta Silence, MD   4 mg at 04/06/18 0947   Or  . ondansetron (ZOFRAN) injection 4 mg  4 mg Intravenous Q6H PRN Arta Silence, MD      . oxyCODONE-acetaminophen (PERCOCET/ROXICET) 5-325 MG per tablet 1 tablet  1 tablet Oral Q6H PRN Hillary Bow, MD   1 tablet at 04/09/18 0458  . senna-docusate (Senokot-S) tablet 1 tablet  1 tablet Oral QHS PRN Arta Silence, MD         Objective: Vital signs in last 24 hours: Temp:  [97.8 F (36.6 C)-98 F (36.7 C)] 97.8 F  (36.6 C) (06/26 0806) Pulse Rate:  [77-83] 79 (06/26 0806) Resp:  [18] 18 (06/25 2034) BP: (140-152)/(87-92) 152/92 (06/26 0806) SpO2:  [96 %] 96 % (06/26 0806)  Intake/Output from previous day: 06/25 0701 - 06/26 0700 In: -  Out: 1550 [Urine:1550] Intake/Output this shift: No intake/output data recorded.   Physical Exam Constitutional: Well nourished. Alert and oriented, No acute distress. HEENT: [place] AT, moist mucus membranes. Trachea midline, no masses. Cardiovascular: No clubbing, cyanosis, or edema. Respiratory: Normal respiratory effort, no increased work of breathing. GI: Abdomen is soft, non tender, non distended, no abdominal masses. Liver and spleen not palpable.  No hernias appreciated.  Stool sample for occult testing is not indicated.   GU: No CVA tenderness.  No bladder fullness or masses.  Foley in place.  Skin: No rashes, bruises or suspicious lesions. Lymph: No cervical or inguinal adenopathy. Neurologic: Grossly intact, no focal deficits, moving all 4 extremities. Psychiatric: Normal mood and affect.  Lab Results:  Recent Labs    04/08/18 0411 04/09/18 0655  WBC 2.4* 2.0*  HGB 10.8* 10.8*  HCT 30.2* 30.2*  PLT 166 162   BMET Recent Labs    04/08/18  0411 04/09/18 0655  NA 140 138  K 4.0 3.9  CL 109 110  CO2 22 20*  GLUCOSE 87 77  BUN 19 18  CREATININE 1.71* 2.59*  CALCIUM 8.6* 8.3*   PT/INR No results for input(s): LABPROT, INR in the last 72 hours. ABG No results for input(s): PHART, HCO3 in the last 72 hours.  Invalid input(s): PCO2, PO2  Studies/Results: US Renal  Result Date: 04/08/2018 CLINICAL DATA:  Hydronephrosis EXAM: RENAL / URINARY TRACT ULTRASOUND COMPLETE COMPARISON:  04/04/2018 FINDINGS: Right Kidney: Length: 12.6 cm. Mild hydronephrosis is noted increased from the prior CT examination. Left Kidney: Length: 13.6 cm. Hydronephrosis is noted similar to that seen on prior CT examination. Bladder: Decompressed by Foley catheter.  Mild ascites is noted similar to that seen on prior CT. IMPRESSION: Mild bilateral hydronephrosis somewhat more prominent on the right when compared with the prior CT. The left hydronephrosis is stable. Note is made of ascites similar to that seen on prior CT. Some changes are noted in the abdominal cavity consistent with the omental disease seen on prior CT. Electronically Signed   By: Inez Catalina M.D.   On: 04/08/2018 09:43   Ct Renal Stone Study  Result Date: 04/08/2018 CLINICAL DATA:  Inpatient. 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 Follow-up hydronephrosis. EXAM: CT ABDOMEN AND PELVIS WITHOUT CONTRAST TECHNIQUE: Multidetector CT imaging of the abdomen and pelvis was performed following the standard protocol without IV contrast. COMPARISON:  04/04/2018 CT abdomen/pelvis. 04/08/2018 renal sonogram. FINDINGS: Lower chest: Small dependent right pleural effusion is increased. Trace dependent left pleural effusion is new. Stable coarsely calcified granulomatous left hilar nodes and left lower lobe subcentimeter granuloma. Dependent bibasilar atelectasis, moderate on the right and mild on the left, increased bilaterally. Hepatobiliary: Normal liver size. Redemonstration of solid 6.0 x 4.1 cm mass in the intersegmental fissure (series 2/image 28), previously 5.5 x 4.1 cm, slightly increased. Nondistended gallbladder. Stable diffuse gallbladder wall thickening. Poorly marginated 8.1 x 7.2 cm solid soft tissue mass in the right upper quadrant abutting the inferior gallbladder (series 2/image 45), increased from 7.5 x 5.8 cm. No radiopaque cholelithiasis. No biliary ductal dilatation. Pancreas: Normal, with no mass or duct dilation. Spleen: New mild splenomegaly (craniocaudal splenic length 14.3 cm, increased from 12.3 cm). No discrete splenic mass. Adrenals/Urinary Tract: Normal adrenals. Mild bilateral hydroureteronephrosis, worsened on the  right and stable on the left since 04/04/2018 CT. No renal or ureteral stones. No contour deforming renal mass. Bladder collapsed by indwelling Foley catheter. Stable diffuse bladder wall thickening with stable 3.0 cm anterior left bladder wall mass (series 2/image 96). Stomach/Bowel: Normal non-distended stomach. Normal caliber small bowel with no small bowel wall thickening. Normal appendix. Normal large bowel with no diverticulosis, large bowel wall thickening or pericolonic fat stranding. Vascular/Lymphatic: Atherosclerotic nonaneurysmal abdominal aorta. No pathologically enlarged lymph nodes in the abdomen or pelvis. Reproductive: Massively enlarged prostate measuring 10.1 cm in width, increased from 9.7 cm. Fat stranding surrounding enlarged prostate, which encases the distal pelvic ureters bilaterally. Other: No pneumoperitoneum. Soft tissue caking throughout the omentum appears subjectively slightly increased. Small to moderate volume ascites, increased. Diffuse thickening of the mesenteric folds and parietal peritoneum, most apparent in the right lower quadrant (series 2/image 86). Musculoskeletal: No aggressive appearing focal osseous lesions. Moderate thoracolumbar spondylosis. IMPRESSION: 1. Progression of disease in the short interval since 04/04/2018 CT, suggesting an aggressive process, probably recurrent lymphoma. Right upper quadrant mass abutting the inferior gallbladder  and mass in the intersegmental fissure of the left liver lobe have increased in size. Soft tissue caking throughout the omentum and diffuse peritoneal thickening have increased. Small to moderate volume ascites, increased. Massive prostate enlargement, increased. Stable left anterior bladder mass. New mild splenomegaly. 2. 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 3. Increased small  dependent right pleural effusion. New trace dependent left pleural effusion. Worsened dependent bibasilar atelectasis. Electronically Signed   By: Ilona Sorrel M.D.   On: 04/08/2018 18:41     Assessment and Plan  1. Bilateral hydronephrosis with rising creatinine  Decompression of bladder was unsuccessful in reducing hydronephrosis this is likely due to the prostate obstructing the UO's   Patient will be scheduled for IR placement of bilateral nephrostomy tubes today      LOS: 4 days    [facility] 04/09/2018

## 2018-04-10 ENCOUNTER — Ambulatory Visit: Payer: BC Managed Care – PPO | Admitting: Internal Medicine

## 2018-04-10 LAB — BASIC METABOLIC PANEL
ANION GAP: 10 (ref 5–15)
BUN: 18 mg/dL (ref 6–20)
CO2: 18 mmol/L — ABNORMAL LOW (ref 22–32)
CREATININE: 2.23 mg/dL — AB (ref 0.61–1.24)
Calcium: 8.4 mg/dL — ABNORMAL LOW (ref 8.9–10.3)
Chloride: 110 mmol/L (ref 98–111)
GFR calc Af Amer: 41 mL/min — ABNORMAL LOW (ref >60)
GFR, EST NON AFRICAN AMERICAN: 36 mL/min — AB (ref >60)
GLUCOSE: 81 mg/dL (ref 70–99)
Potassium: 3.9 mmol/L (ref 3.5–5.1)
Sodium: 138 mmol/L (ref 135–145)

## 2018-04-10 LAB — URIC ACID: Uric Acid, Serum: 3 mg/dL — ABNORMAL LOW (ref 3.7–8.6)

## 2018-04-10 MED ORDER — AMLODIPINE BESYLATE 5 MG PO TABS
5.0000 mg | ORAL_TABLET | Freq: Every day | ORAL | Status: DC
  Administered 2018-04-10 – 2018-04-11 (×2): 5 mg via ORAL
  Filled 2018-04-10 (×2): qty 1

## 2018-04-10 NOTE — Progress Notes (Signed)
Urology Consult Follow Up  Subjective: Patient is quite tearful this morning feeling overwhelmed.  He is feeling bloated and tight in the abdominal area.  Nephrostomy tubes in place.    Anti-infectives: Anti-infectives (From admission, onward)   Start     Dose/Rate Route Frequency Ordered Stop   04/10/18 0600  ciprofloxacin (CIPRO) IVPB 400 mg     400 mg 200 mL/hr over 60 Minutes Intravenous Every 12 hours 04/09/18 1627     04/09/18 1408  ciprofloxacin (CIPRO) 400 MG/200ML IVPB    Note to Pharmacy:  Carlynn Spry   : cabinet override      04/09/18 1408 04/09/18 1532      Current Facility-Administered Medications  Medication Dose Route Frequency Provider Last Rate Last Dose  . 0.9 %  sodium chloride infusion   Intravenous Continuous Earlie Server, MD 125 mL/hr at 04/10/18 0200    . acetaminophen (TYLENOL) tablet 650 mg  650 mg Oral Q6H PRN Arta Silence, MD   650 mg at 04/09/18 0458   Or  . acetaminophen (TYLENOL) suppository 650 mg  650 mg Rectal Q6H PRN Arta Silence, MD      . alfuzosin (UROXATRAL) 24 hr tablet 10 mg  10 mg Oral Q breakfast McKenzie, Candee Furbish, MD   10 mg at 04/08/18 1007  . allopurinol (ZYLOPRIM) tablet 100 mg  100 mg Oral TID Earlie Server, MD   100 mg at 04/09/18 2123  . ALPRAZolam Duanne Moron) tablet 0.5 mg  0.5 mg Oral TID PRN Max Sane, MD   0.5 mg at 04/10/18 0816  . bisacodyl (DULCOLAX) EC tablet 5 mg  5 mg Oral Daily PRN Arta Silence, MD      . ciprofloxacin (CIPRO) IVPB 400 mg  400 mg Intravenous Q12H Arne Cleveland, MD 200 mL/hr at 04/10/18 0712 400 mg at 04/10/18 6063  . DULoxetine (CYMBALTA) DR capsule 90 mg  90 mg Oral Daily Arta Silence, MD   90 mg at 04/08/18 1007  . heparin injection 5,000 Units  5,000 Units Subcutaneous Q8H Arta Silence, MD   5,000 Units at 04/10/18 0711  . morphine 2 MG/ML injection 2 mg  2 mg Intravenous Q3H PRN Hillary Bow, MD   2 mg at 04/10/18 0720  . ondansetron (ZOFRAN) tablet 4 mg  4 mg Oral Q6H  PRN Arta Silence, MD   4 mg at 04/06/18 0947   Or  . ondansetron (ZOFRAN) injection 4 mg  4 mg Intravenous Q6H PRN Arta Silence, MD   4 mg at 04/10/18 0815  . oxyCODONE-acetaminophen (PERCOCET/ROXICET) 5-325 MG per tablet 1 tablet  1 tablet Oral Q6H PRN Hillary Bow, MD   1 tablet at 04/10/18 0815  . senna-docusate (Senokot-S) tablet 1 tablet  1 tablet Oral QHS PRN Arta Silence, MD         Objective: Vital signs in last 24 hours: Temp:  [98 F (36.7 C)-98.3 F (36.8 C)] 98 F (36.7 C) (06/27 0834) Pulse Rate:  [88-109] 109 (06/27 0834) Resp:  [16-36] 20 (06/27 0834) BP: (145-183)/(88-109) 163/109 (06/27 0834) SpO2:  [92 %-96 %] 95 % (06/27 0834)  Intake/Output from previous day: 06/26 0701 - 06/27 0700 In: 3106.3 [I.V.:2406.3] Out: 1280 [Urine:1280] Intake/Output this shift: Total I/O In: 150 [Other:150] Out: -    Physical Exam Constitutional: Well nourished. Alert and oriented, No acute distress. HEENT: [place] AT, moist mucus membranes. Trachea midline, no masses. Cardiovascular: No clubbing, cyanosis, or edema. Respiratory: Normal respiratory effort, no increased work of breathing. GI: Abdomen is  soft, non tender, non distended, no abdominal masses. Liver and spleen not palpable.  No hernias appreciated.  Stool sample for occult testing is not indicated.   GU: No CVA tenderness.  No bladder fullness or masses.  Nephrostomy dressings are clean and dry.  Right nephrostomy draining clear pink urine.  Left nephrostomy draining dark red urine.  Foley in place, draining clear pink urine.   Skin: No rashes, bruises or suspicious lesions. Lymph: No cervical or inguinal adenopathy. Neurologic: Grossly intact, no focal deficits, moving all 4 extremities. Psychiatric: Normal mood and affect.  Lab Results:  Recent Labs    04/08/18 0411 04/09/18 0655  WBC 2.4* 2.0*  HGB 10.8* 10.8*  HCT 30.2* 30.2*  PLT 166 162   BMET Recent Labs    04/09/18 0655  04/10/18 0503  NA 138 138  K 3.9 3.9  CL 110 110  CO2 20* 18*  GLUCOSE 77 81  BUN 18 18  CREATININE 2.59* 2.23*  CALCIUM 8.3* 8.4*   PT/INR Recent Labs    04/09/18 1256  LABPROT 13.2  INR 1.01   ABG No results for input(s): PHART, HCO3 in the last 72 hours.  Invalid input(s): PCO2, PO2  Studies/Results: [place] Intraoperative  Result Date: 04/09/2018 CLINICAL DATA:  Ultrasound was provided for use by the ordering physician, and a technical charge was applied by the performing facility.  No radiologist interpretation/professional services rendered.   US Renal  Result Date: 04/08/2018 CLINICAL DATA:  Hydronephrosis EXAM: RENAL / URINARY TRACT ULTRASOUND COMPLETE COMPARISON:  04/04/2018 FINDINGS: Right Kidney: Length: 12.6 cm. Mild hydronephrosis is noted increased from the prior CT examination. Left Kidney: Length: 13.6 cm. Hydronephrosis is noted similar to that seen on prior CT examination. Bladder: Decompressed by Foley catheter. Mild ascites is noted similar to that seen on prior CT. IMPRESSION: Mild bilateral hydronephrosis somewhat more prominent on the right when compared with the prior CT. The left hydronephrosis is stable. Note is made of ascites similar to that seen on prior CT. Some changes are noted in the abdominal cavity consistent with the omental disease seen on prior CT. Electronically Signed   By: Inez Catalina M.D.   On: 04/08/2018 09:43   Ct Renal Stone Study  Result Date: 04/08/2018 CLINICAL DATA:  Inpatient. 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 Follow-up hydronephrosis. EXAM: CT ABDOMEN AND PELVIS WITHOUT CONTRAST TECHNIQUE: Multidetector CT imaging of the abdomen and pelvis was performed following the standard protocol without IV contrast. COMPARISON:  04/04/2018 CT abdomen/pelvis. 04/08/2018 renal sonogram. FINDINGS: Lower chest: Small dependent right pleural effusion is increased. Trace  dependent left pleural effusion is new. Stable coarsely calcified granulomatous left hilar nodes and left lower lobe subcentimeter granuloma. Dependent bibasilar atelectasis, moderate on the right and mild on the left, increased bilaterally. Hepatobiliary: Normal liver size. Redemonstration of solid 6.0 x 4.1 cm mass in the intersegmental fissure (series 2/image 28), previously 5.5 x 4.1 cm, slightly increased. Nondistended gallbladder. Stable diffuse gallbladder wall thickening. Poorly marginated 8.1 x 7.2 cm solid soft tissue mass in the right upper quadrant abutting the inferior gallbladder (series 2/image 45), increased from 7.5 x 5.8 cm. No radiopaque cholelithiasis. No biliary ductal dilatation. Pancreas: Normal, with no mass or duct dilation. Spleen: New mild splenomegaly (craniocaudal splenic length 14.3 cm, increased from 12.3 cm). No discrete splenic mass. Adrenals/Urinary Tract: Normal adrenals. Mild bilateral hydroureteronephrosis, worsened on the right and stable on the left since 04/04/2018 CT. No renal or  ureteral stones. No contour deforming renal mass. Bladder collapsed by indwelling Foley catheter. Stable diffuse bladder wall thickening with stable 3.0 cm anterior left bladder wall mass (series 2/image 96). Stomach/Bowel: Normal non-distended stomach. Normal caliber small bowel with no small bowel wall thickening. Normal appendix. Normal large bowel with no diverticulosis, large bowel wall thickening or pericolonic fat stranding. Vascular/Lymphatic: Atherosclerotic nonaneurysmal abdominal aorta. No pathologically enlarged lymph nodes in the abdomen or pelvis. Reproductive: Massively enlarged prostate measuring 10.1 cm in width, increased from 9.7 cm. Fat stranding surrounding enlarged prostate, which encases the distal pelvic ureters bilaterally. Other: No pneumoperitoneum. Soft tissue caking throughout the omentum appears subjectively slightly increased. Small to moderate volume ascites,  increased. Diffuse thickening of the mesenteric folds and parietal peritoneum, most apparent in the right lower quadrant (series 2/image 86). Musculoskeletal: No aggressive appearing focal osseous lesions. Moderate thoracolumbar spondylosis. IMPRESSION: 1. Progression of disease in the short interval since 04/04/2018 CT, suggesting an aggressive process, probably recurrent lymphoma. Right upper quadrant mass abutting the inferior gallbladder and mass in the intersegmental fissure of the left liver lobe have increased in size. Soft tissue caking throughout the omentum and diffuse peritoneal thickening have increased. Small to moderate volume ascites, increased. Massive prostate enlargement, increased. Stable left anterior bladder mass. New mild splenomegaly. 2. 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 3. Increased small dependent right pleural effusion. New trace dependent left pleural effusion. Worsened dependent bibasilar atelectasis. Electronically Signed   By: Ilona Sorrel M.D.   On: 04/08/2018 18:41   Ir Nephrostomy Placement Left  Result Date: 04/09/2018 CLINICAL DATA:  Diffuse large B-cell lymphoma, prostatic enlargement. 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 worsening renal function. Percutaneous drainage requested. EXAM: BILATERAL PERCUTANEOUS NEPHROSTOMY CATHETER PLACEMENT UNDER ULTRASOUND AND FLUOROSCOPIC GUIDANCE FLUOROSCOPY TIME:  1.8 minutes; 103 mGy TECHNIQUE: The procedure, risks (including but not limited to bleeding, infection, organ damage ), benefits, and alternatives were explained to the patient. Questions regarding the procedure were encouraged and answered. The patient understands and consents to  the procedure. Bilateral flank regions prepped , draped in usual sterile fashion, infiltrated locally with 1% lidocaine. As antibiotic prophylaxis, Cipro 400 mg was ordered pre-procedure and administered intravenously within one hour of incision. Intravenous Fentanyl and Versed were administered as conscious sedation during continuous monitoring of the patient's level of consciousness and physiological / cardiorespiratory status by the radiology RN, with a total moderate sedation time of 30 minutes. Under real-time ultrasound guidance, a 21-gauge trocar needle was advanced into a posterior lower pole calyx of the right kidney. Ultrasound image documentation was saved. Urine spontaneously returned through the needle. Needle was exchanged over a guidewire for transitional dilator. Contrast injection confirmed appropriate positioning. Catheter was exchanged over a guidewire for a 10 French pigtail catheter, formed centrally within the right renal collecting system. Contrast injection confirms appropriate positioning and patency. In similar fashion, Under real-time ultrasound guidance, a 21-gauge trocar needle was advanced into a posterior lower pole calyx of the left kidney. Ultrasound image documentation was saved. Urine spontaneously returned through the needle. Needle was exchanged over a guidewire for transitional dilator. Contrast injection confirmed appropriate positioning. Catheter was exchanged over a guidewire for a 10 French pigtail catheter, formed centrally within the left renal collecting system. Contrast injection confirms  appropriate positioning and patency. Both catheters secured externally with 0 Prolene suture and StatLock, and placed to external drain bag. The patient tolerated the procedure well. COMPLICATIONS: COMPLICATIONS none IMPRESSION: 1. Technically successful bilateral percutaneous nephrostomy catheter placement. Electronically Signed   By: Lucrezia Europe M.D.   On: 04/09/2018 16:21   Ir  Nephrostomy Placement Right  Result Date: 04/09/2018 CLINICAL DATA:  Diffuse large B-cell lymphoma, prostatic enlargement. 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 worsening renal function. Percutaneous drainage requested. EXAM: BILATERAL PERCUTANEOUS NEPHROSTOMY CATHETER PLACEMENT UNDER ULTRASOUND AND FLUOROSCOPIC GUIDANCE FLUOROSCOPY TIME:  1.8 minutes; 103 mGy TECHNIQUE: The procedure, risks (including but not limited to bleeding, infection, organ damage ), benefits, and alternatives were explained to the patient. Questions regarding the procedure were encouraged and answered. The patient understands and consents to the procedure. Bilateral flank regions prepped , draped in usual sterile fashion, infiltrated locally with 1% lidocaine. As antibiotic prophylaxis, Cipro 400 mg was ordered pre-procedure and administered intravenously within one hour of incision. Intravenous Fentanyl and Versed were administered as conscious sedation during continuous monitoring of the patient's level of consciousness and physiological / cardiorespiratory status by the radiology RN, with a total moderate sedation time of 30 minutes. Under real-time ultrasound guidance, a 21-gauge trocar needle was advanced into a posterior lower pole calyx of the right kidney. Ultrasound image documentation was saved. Urine spontaneously returned through the needle. Needle was exchanged over a guidewire for transitional dilator. Contrast injection confirmed appropriate positioning. Catheter was exchanged over a guidewire for a 10 French pigtail catheter, formed centrally within the right renal collecting system. Contrast injection confirms appropriate positioning and patency. In similar fashion, Under real-time ultrasound guidance, a 21-gauge trocar needle was advanced into a posterior lower pole calyx of  the left kidney. Ultrasound image documentation was saved. Urine spontaneously returned through the needle. Needle was exchanged over a guidewire for transitional dilator. Contrast injection confirmed appropriate positioning. Catheter was exchanged over a guidewire for a 10 French pigtail catheter, formed centrally within the left renal collecting system. Contrast injection confirms appropriate positioning and patency. Both catheters secured externally with 0 Prolene suture and StatLock, and placed to external drain bag. The patient tolerated the procedure well. COMPLICATIONS: COMPLICATIONS none IMPRESSION: 1. Technically successful bilateral percutaneous nephrostomy catheter placement. Electronically Signed   By: Lucrezia Europe M.D.   On: 04/09/2018 16:21     Assessment and Plan 1. Bilateral hydronephrosis due to prostatomegaly   S/P nephrostomy tubes placement  Irrigate as needed  Monitor UOP  Waiting for transfer to [facility] oncology  Urology to sign off at this time      LOS: 5 days    [facility] 04/10/2018

## 2018-04-10 NOTE — Progress Notes (Signed)
[place] [facility] [place] NAME: Mark Burns    MR#:  [id number]  DATE OF BIRTH:  06-30-1981  SUBJECTIVE:  CHIEF COMPLAINT:   Abdominal discomfort and distension.  S/p b/l nephrostomy. REVIEW OF SYSTEMS:  CONSTITUTIONAL: No fever, fatigue or weakness.  EYES: No blurred or double vision.  EARS, NOSE, AND THROAT: No tinnitus or ear pain.  RESPIRATORY: No cough, shortness of breath, wheezing or hemoptysis.  CARDIOVASCULAR: No chest pain, orthopnea, edema.  GASTROINTESTINAL: No nausea, vomiting, diarrhea or abdominal pain.  GENITOURINARY: No dysuria, hematuria.  ENDOCRINE: No polyuria, nocturia,  HEMATOLOGY: No anemia, easy bruising or bleeding SKIN: No rash or lesion. MUSCULOSKELETAL: No joint pain or arthritis.   NEUROLOGIC: No tingling, numbness, weakness.  PSYCHIATRY: No anxiety or depression.   DRUG ALLERGIES:   Allergies  Allergen Reactions  . Sulfa Antibiotics Hives    VITALS:  Blood pressure (!) 160/80, pulse (!) 109, temperature 98 F (36.7 C), temperature source Oral, resp. rate 20, height 6\' 2"  (1.88 m), weight 251 lb (113.9 kg), SpO2 95 %.  PHYSICAL EXAMINATION:  GENERAL:  37 y.o.-year-old patient lying in the bed with no acute distress.  Obesity. EYES: Pupils equal, round, reactive to light and accommodation. No scleral icterus. Extraocular muscles intact.  HEENT: Head atraumatic, normocephalic. Oropharynx and nasopharynx clear.  NECK:  Supple, no jugular venous distention. No thyroid enlargement, no tenderness.  LUNGS: Normal breath sounds bilaterally, no wheezing, rales,rhonchi or crepitation. No use of accessory muscles of respiration.  CARDIOVASCULAR: S1, S2 normal. No murmurs, rubs, or gallops.  ABDOMEN: Soft, nontender, nondistended. Bowel sounds present. No organomegaly or mass. S/p b/l nephrostomy with bloody drainage. EXTREMITIES: No pedal edema, cyanosis, or clubbing.  NEUROLOGIC: Cranial nerves II through XII  are intact. Muscle strength 5/5 in all extremities. Sensation intact. Gait not checked.  PSYCHIATRIC: The patient is alert and oriented x 3.  SKIN: No obvious rash, lesion, or ulcer.  LABORATORY PANEL:   CBC Recent Labs  Lab 04/09/18 0655  WBC 2.0*  HGB 10.8*  HCT 30.2*  PLT 162   ------------------------------------------------------------------------------------------------------------------  Chemistries  Recent Labs  Lab 04/06/18 0533  04/09/18 0655 04/10/18 0503  NA 140   < > 138 138  K 3.9   < > 3.9 3.9  CL 109   < > 110 110  CO2 22   < > 20* 18*  GLUCOSE 98   < > 77 81  BUN 17   < > 18 18  CREATININE 1.48*   < > 2.59* 2.23*  CALCIUM 8.3*   < > 8.3* 8.4*  MG 2.0  --   --   --   AST 35  --  36  --   ALT 29  --  24  --   ALKPHOS 64  --  69  --   BILITOT 0.6  --  0.8  --    < > = values in this interval not displayed.   ------------------------------------------------------------------------------------------------------------------  Cardiac Enzymes No results for input(s): TROPONINI in the last 168 hours. ------------------------------------------------------------------------------------------------------------------  RADIOLOGY:  [place] Intraoperative  Result Date: 04/09/2018 CLINICAL DATA:  Ultrasound was provided for use by the ordering physician, and a technical charge was applied by the performing facility.  No radiologist interpretation/professional services rendered.   Ir Nephrostomy Placement Left  Result Date: 04/09/2018 CLINICAL DATA:  Diffuse large B-cell lymphoma, prostatic enlargement. 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 worsening renal function. Percutaneous drainage requested. EXAM: BILATERAL PERCUTANEOUS NEPHROSTOMY CATHETER PLACEMENT UNDER ULTRASOUND AND FLUOROSCOPIC GUIDANCE FLUOROSCOPY TIME:  1.8  minutes; 103 mGy TECHNIQUE: The procedure, risks (including but not limited to bleeding, infection, organ damage ), benefits, and alternatives were explained to the patient. Questions regarding the procedure were encouraged and answered. The patient understands and consents to the procedure. Bilateral flank regions prepped , draped in usual sterile fashion, infiltrated locally with 1% lidocaine. As antibiotic prophylaxis, Cipro 400 mg was ordered pre-procedure and administered intravenously within one hour of incision. Intravenous Fentanyl and Versed were administered as conscious sedation during continuous monitoring of the patient's level of consciousness and physiological / cardiorespiratory status by the radiology RN, with a total moderate sedation time of 30 minutes. Under real-time ultrasound guidance, a 21-gauge trocar needle was advanced into a posterior lower pole calyx of the right kidney. Ultrasound image documentation was saved. Urine spontaneously returned through the needle. Needle was exchanged over a guidewire for transitional dilator. Contrast injection confirmed appropriate positioning. Catheter was exchanged over a guidewire for a 10 French pigtail catheter, formed centrally within the right renal collecting system. Contrast injection confirms appropriate positioning and patency. In similar fashion, Under real-time ultrasound guidance, a 21-gauge trocar needle was advanced into a posterior lower pole calyx of the left kidney. Ultrasound image documentation was saved. Urine spontaneously returned through the needle. Needle was exchanged over a guidewire for transitional dilator. Contrast injection confirmed appropriate positioning. Catheter was exchanged over a guidewire for a 10 French pigtail catheter, formed centrally within the left renal collecting system. Contrast injection confirms appropriate positioning and patency. Both catheters secured externally with 0 Prolene suture and StatLock, and  placed to external drain bag. The patient tolerated the procedure well. COMPLICATIONS: COMPLICATIONS none IMPRESSION: 1. Technically successful bilateral percutaneous nephrostomy catheter placement. Electronically Signed   By: Lucrezia Europe M.D.   On: 04/09/2018 16:21   Ir Nephrostomy Placement Right  Result Date: 04/09/2018 CLINICAL DATA:  Diffuse large B-cell lymphoma, prostatic enlargement. 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 worsening renal function. Percutaneous drainage requested. EXAM: BILATERAL PERCUTANEOUS NEPHROSTOMY CATHETER PLACEMENT UNDER ULTRASOUND AND FLUOROSCOPIC GUIDANCE FLUOROSCOPY TIME:  1.8 minutes; 103 mGy TECHNIQUE: The procedure, risks (including but not limited to bleeding, infection, organ damage ), benefits, and alternatives were explained to the patient. Questions regarding the procedure were encouraged and answered. The patient understands and consents to the procedure. Bilateral flank regions prepped , draped in usual sterile fashion, infiltrated locally with 1% lidocaine. As antibiotic prophylaxis, Cipro 400 mg was ordered pre-procedure and administered intravenously within one hour of incision. Intravenous Fentanyl and Versed were administered as conscious sedation during continuous monitoring of the patient's level of consciousness and physiological / cardiorespiratory status by the radiology RN, with a total moderate sedation time of 30 minutes. Under real-time ultrasound guidance, a 21-gauge trocar needle was advanced into a posterior lower pole calyx of the right kidney. Ultrasound image documentation was saved. Urine spontaneously returned through the needle. Needle was exchanged over a guidewire for transitional dilator. Contrast injection confirmed appropriate positioning. Catheter was exchanged over a guidewire for a 10 French  pigtail catheter, formed centrally within the right renal collecting system. Contrast injection confirms appropriate positioning and patency. In similar fashion, Under real-time ultrasound guidance, a 21-gauge trocar needle was  advanced into a posterior lower pole calyx of the left kidney. Ultrasound image documentation was saved. Urine spontaneously returned through the needle. Needle was exchanged over a guidewire for transitional dilator. Contrast injection confirmed appropriate positioning. Catheter was exchanged over a guidewire for a 10 French pigtail catheter, formed centrally within the left renal collecting system. Contrast injection confirms appropriate positioning and patency. Both catheters secured externally with 0 Prolene suture and StatLock, and placed to external drain bag. The patient tolerated the procedure well. COMPLICATIONS: COMPLICATIONS none IMPRESSION: 1. Technically successful bilateral percutaneous nephrostomy catheter placement. Electronically Signed   By: Lucrezia Europe M.D.   On: 04/09/2018 16:21    EKG:   Orders placed or performed during the hospital encounter of 12/26/17  . ED EKG 12-Lead  . ED EKG 12-Lead  . EKG 12-Lead  . EKG 12-Lead  . EKG    ASSESSMENT AND PLAN:   #Acute kidney injury-postrenal-with left-sided mild hydronephrosis and hydroureter probably from  prostate enlargement IV fluids, avoid nephrotoxins, Foley catheter better creatinine.  Follow-up BMP.  * Left hydronephrosis:  Renal US Today shows Mild bilateral hydronephrosis somewhat more prominent on the right when compared with the prior CT. The left hydronephrosis is stable S/p b/l nephrostomy.  #Clinical lymphoma recurrence - Significant elevated LDH.  abnormal CT findings concerning for lymphoma recurrence. Dr Janese Banks recommends transfer to [facility] under [facility]  #Hyperuricemia, patient is at high risk of tumor lysis syndrome. Continue allopurinol  - Aggressive IV hydration.Onco has given 1 dose  of rasburicase y'day - Uric acid has normalized - Dr.Beaven requests that at discharge patient to bring CD with a copy of his CT image which may expedite his care at [facility].   #Hypokalemia and hypomagnesemia Repleted and improved.   #transaminitis Improved.  #History of gout no flareups at this time  DVT prophylaxis with heparin subcu  Still waiting for transfer to [facility].  All the records are reviewed and case discussed with Care Management/Social Workerr. Management plans discussed with the patient, Dr Janese Banks and they are in agreement.  CODE STATUS: FULL CODE  TOTAL TIME TAKING CARE OF THIS PATIENT: 35 minutes.   POSSIBLE D/C IN 1-2  DAYS, DEPENDING ON CLINICAL CONDITION. Urology and onco eval  Note: This dictation was prepared with Dragon dictation along with smaller phrase technology. Any transcriptional errors that result from this process are unintentional.   Demetrios Loll M.D on 04/10/2018 at 2:50 PM  Between 7am to 6pm - Pager - [phone] After 6pm go to [email or web address] - password [email or web address] [facility]  Office  [phone]  CC: Primary care physician; Venia Carbon, MD

## 2018-04-10 NOTE — Plan of Care (Signed)
  Problem: Education: Goal: Knowledge of General Education information will improve Outcome: Progressing   Problem: Health Behavior/Discharge Planning: Goal: Ability to manage health-related needs will improve Outcome: Progressing   Problem: Clinical Measurements: Goal: Ability to maintain clinical measurements within normal limits will improve Outcome: Progressing Goal: Will remain free from infection Outcome: Progressing Goal: Diagnostic test results will improve Outcome: Progressing Goal: Respiratory complications will improve Outcome: Progressing Goal: Cardiovascular complication will be avoided Outcome: Progressing   Problem: Activity: Goal: Risk for activity intolerance will decrease Outcome: Progressing   Problem: Nutrition: Goal: Adequate nutrition will be maintained Outcome: Progressing   Problem: Coping: Goal: Level of anxiety will decrease Outcome: Progressing   Problem: Elimination: Goal: Will not experience complications related to bowel motility Outcome: Progressing Goal: Will not experience complications related to urinary retention Outcome: Progressing   Problem: Pain Managment: Goal: General experience of comfort will improve Outcome: Progressing   Problem: Safety: Goal: Ability to remain free from injury will improve Outcome: Progressing   Problem: Skin Integrity: Goal: Risk for impaired skin integrity will decrease Outcome: Progressing   Problem: Clinical Measurements: Goal: Postoperative complications will be avoided or minimized Outcome: Progressing   Problem: Skin Integrity: Goal: Demonstration of wound healing without infection will improve Outcome: Progressing

## 2018-04-11 ENCOUNTER — Inpatient Hospital Stay: Payer: BC Managed Care – PPO

## 2018-04-11 ENCOUNTER — Encounter: Payer: Self-pay | Admitting: Interventional Radiology

## 2018-04-11 DIAGNOSIS — R140 Abdominal distension (gaseous): Secondary | ICD-10-CM

## 2018-04-11 HISTORY — PX: IR NEPHROSTOMY PLACEMENT LEFT: IMG6063

## 2018-04-11 LAB — BASIC METABOLIC PANEL
Anion gap: 9 (ref 5–15)
BUN: 15 mg/dL (ref 6–20)
CHLORIDE: 109 mmol/L (ref 98–111)
CO2: 20 mmol/L — ABNORMAL LOW (ref 22–32)
Calcium: 8.3 mg/dL — ABNORMAL LOW (ref 8.9–10.3)
Creatinine, Ser: 1.65 mg/dL — ABNORMAL HIGH (ref 0.61–1.24)
GFR calc Af Amer: 59 mL/min — ABNORMAL LOW (ref >60)
GFR calc non Af Amer: 51 mL/min — ABNORMAL LOW (ref >60)
Glucose, Bld: 98 mg/dL (ref 70–99)
POTASSIUM: 3.8 mmol/L (ref 3.5–5.1)
Sodium: 138 mmol/L (ref 135–145)

## 2018-04-11 LAB — LACTATE DEHYDROGENASE, PLEURAL OR PERITONEAL FLUID: LD, Fluid: 1472 U/L — ABNORMAL HIGH (ref 3–23)

## 2018-04-11 LAB — CYTOLOGY - NON PAP

## 2018-04-11 LAB — URIC ACID: Uric Acid, Serum: 3.7 mg/dL (ref 3.7–8.6)

## 2018-04-11 SURGERY — Surgical Case
Anesthesia: *Unknown

## 2018-04-11 MED ORDER — BUTALBITAL-APAP-CAFFEINE 50-325-40 MG PO TABS
1.0000 | ORAL_TABLET | Freq: Four times a day (QID) | ORAL | Status: DC | PRN
  Administered 2018-04-11 – 2018-04-12 (×3): 1 via ORAL
  Filled 2018-04-11 (×6): qty 1

## 2018-04-11 MED ORDER — SODIUM CHLORIDE FLUSH 0.9 % IV SOLN
INTRAVENOUS | Status: AC
  Filled 2018-04-11: qty 40

## 2018-04-11 NOTE — Procedures (Signed)
Pre Procedure Dx: Concern for poorly functioning left sided PCN Post Procedural Dx: Same  Appropriately positioned and functioning left-sided percutaneous nephrostomy catheter.  No exchange performed.    While there is a small amount nonocclusive clot seen within the left renal collecting system, there is no evidence of left-sided pelvicaliectasis and additionally there is brisk passage of contrast through the left ureter to the level of the urinary bladder.   Note, given lack of left-sided urinary obstruction, output from the left-sided percutaneous nephrostomy catheter may be variable as some urine will drain to the left-sided nephrostomy while some will drain through the ureter to the urinary bladder and out the patient's Foley catheter.   EBL: None Complications: None immediate.  Ronny Bacon, MD Pager #: [phone]

## 2018-04-11 NOTE — Progress Notes (Signed)
Pt. Received from floor. Dr. Pascal Lux at bedside and spoke with pt. Re: nephrostomy tube(s) status. Consent obtained now.

## 2018-04-11 NOTE — Progress Notes (Signed)
Asked to call Transfer center at [facility] by Dr. Bridgett Larsson. Per Legrand Como at transfer center the hospital it still at capacity and no beds are available. Legrand Como was unable to give me an estimate on when a bed would be available, just stated we would receive a call when the bed was ready.   Transfer center at [facility] [phone]

## 2018-04-11 NOTE — Procedures (Signed)
Pre Procedural Dx: Symptomatic Ascites Post Procedural Dx: Same  Successful US guided paracentesis yielding 3.6 L of serous ascitic fluid. Sample sent to laboratory as requested.  EBL: None  Complications: None immediate  Jay Linda Biehn, MD Pager #: [phone]   

## 2018-04-11 NOTE — Progress Notes (Signed)
Hematology/Oncology Consult note [facility]  [phone] [phone]  Patient Care Team: Venia Carbon, MD as PCP - General (Internal Medicine)   Name of the patient: Mark Burns  [id number]  [birth year]   Interval history- reports abdominal discomfort and tightness. He is moving his bowels  ECOG PS- 0 Pain scale- 3   Review of systems- Review of Systems  Constitutional: Positive for malaise/fatigue. Negative for chills, fever and weight loss.  HENT: Negative for congestion, ear discharge and nosebleeds.   Eyes: Negative for blurred vision.  Respiratory: Negative for cough, hemoptysis, sputum production, shortness of breath and wheezing.   Cardiovascular: Negative for chest pain, palpitations, orthopnea and claudication.  Gastrointestinal: Negative for abdominal pain, blood in stool, constipation, diarrhea, heartburn, melena, nausea and vomiting.       Abdominal discomfort/ tightness  Genitourinary: Negative for dysuria, flank pain, frequency, hematuria and urgency.  Musculoskeletal: Negative for back pain, joint pain and myalgias.  Skin: Negative for rash.  Neurological: Negative for dizziness, tingling, focal weakness, seizures, weakness and headaches.  Endo/Heme/Allergies: Does not bruise/bleed easily.  Psychiatric/Behavioral: Negative for depression and suicidal ideas. The patient does not have insomnia.       Allergies  Allergen Reactions  . Sulfa Antibiotics Hives     Past Medical History:  Diagnosis Date  . ADHD, predominantly inattentive type   . Diffuse large B cell lymphoma ([place])   . Elevated LFTs 11/19/2013  . Erectile dysfunction   . Generalized anxiety disorder   . Gout   . Hypertension   . Recurrent cold sores   . Seborrheic dermatitis      Past Surgical History:  Procedure Laterality Date  . IR NEPHROSTOMY PLACEMENT LEFT  04/09/2018  . IR NEPHROSTOMY PLACEMENT RIGHT  04/09/2018  . VASECTOMY  2014     Social History   Socioeconomic History  . Marital status: Married    Spouse name: Not on file  . Number of children: 2  . Years of education: Not on file  . Highest education level: Not on file  Occupational History  . Occupation: PE Pharmacist, hospital    Comment[employer]ensed [employer] education [employer]  Social Needs  . Financial resource strain: Not on file  . Food insecurity:    Worry: Not on file    Inability: Not on file  . Transportation needs:    Medical: Not on file    Non-medical: Not on file  Tobacco Use  . Smoking status: Never Smoker  . Smokeless tobacco: Never Used  Substance and Sexual Activity  . Alcohol use: Yes    Comment: daily use   . Drug use: No  . Sexual activity: Not on file  Lifestyle  . Physical activity:    Days per week: Not on file    Minutes per session: Not on file  . Stress: Not on file  Relationships  . Social connections:    Talks on phone: Not on file    Gets together: Not on file    Attends religious service: Not on file    Active member of club or organization: Not on file    Attends meetings of clubs or organizations: Not on file    Relationship status: Not on file  . Intimate partner violence:    Fear of current or ex partner: Not on file    Emotionally abused: Not on file    Physically abused: Not on file    Forced sexual activity: Not on file  Other Topics Concern  . Not on file  Social History Narrative   Played college football, married to "Judson Roch."     History reviewed. No pertinent family history.   Current Facility-Administered Medications:  .  0.9 %  sodium chloride infusion, , Intravenous, Continuous, Demetrios Loll, MD, Last Rate: 100 mL/hr at 04/10/18 2158, 100 mL/hr at 04/10/18 2158 .  acetaminophen (TYLENOL) tablet 650 mg, 650 mg, Oral, Q6H PRN, 650 mg at 04/09/18 0458 **OR** acetaminophen (TYLENOL) suppository 650 mg, 650 mg, Rectal, Q6H PRN, Jodell Cipro, Prasanna, MD .  alfuzosin (UROXATRAL) 24 hr tablet 10 mg, 10 mg, Oral, Q  breakfast, McKenzie, Candee Furbish, MD, 10 mg at 04/11/18 0734 .  allopurinol (ZYLOPRIM) tablet 100 mg, 100 mg, Oral, TID, Earlie Server, MD, 100 mg at 04/10/18 2158 .  ALPRAZolam Duanne Moron) tablet 0.5 mg, 0.5 mg, Oral, TID PRN, Max Sane, MD, 0.5 mg at 04/11/18 0132 .  amLODipine (NORVASC) tablet 5 mg, 5 mg, Oral, Daily, Demetrios Loll, MD, 5 mg at 04/10/18 1221 .  bisacodyl (DULCOLAX) EC tablet 5 mg, 5 mg, Oral, Daily PRN, Jodell Cipro, Prasanna, MD .  DULoxetine (CYMBALTA) DR capsule 90 mg, 90 mg, Oral, Daily, Jodell Cipro, Prasanna, MD, 90 mg at 04/10/18 1108 .  heparin injection 5,000 Units, 5,000 Units, Subcutaneous, Q8H, Arta Silence, MD, 5,000 Units at 04/11/18 2992 .  morphine 2 MG/ML injection 2 mg, 2 mg, Intravenous, Q3H PRN, 2 mg at 04/11/18 0614 **OR** [DISCONTINUED] morphine 4 MG/ML injection 4 mg, 4 mg, Intravenous, Q3H PRN, Arta Silence, MD, 4 mg at 04/06/18 0829 .  ondansetron (ZOFRAN) tablet 4 mg, 4 mg, Oral, Q6H PRN, 4 mg at 04/06/18 0947 **OR** ondansetron (ZOFRAN) injection 4 mg, 4 mg, Intravenous, Q6H PRN, Arta Silence, MD, 4 mg at 04/10/18 2158 .  oxyCODONE-acetaminophen (PERCOCET/ROXICET) 5-325 MG per tablet 1 tablet, 1 tablet, Oral, Q6H PRN, Hillary Bow, MD, 1 tablet at 04/11/18 0733 .  senna-docusate (Senokot-S) tablet 1 tablet, 1 tablet, Oral, QHS PRN, Arta Silence, MD  Physical exam:  Vitals:   04/10/18 1541 04/11/18 0003 04/11/18 0606 04/11/18 0754  BP: (!) 170/100 (!) 148/102 (!) 140/94 (!) 159/91  Pulse: (!) 103 97  95  Resp: (!) 22 18  20   Temp: 98 F (36.7 C) 98.5 F (36.9 C)  97.8 F (36.6 C)  TempSrc: Oral Oral  Oral  SpO2: 96% 95%  96%  Weight:      Height:       Physical Exam  Constitutional: He is oriented to person, place, and time. He appears well-developed and well-nourished.  HENT:  Head: Normocephalic and atraumatic.  Eyes: Pupils are equal, round, and reactive to light. EOM are normal.  Neck: Normal range of motion.   Cardiovascular: Normal rate, regular rhythm and normal heart sounds.  Pulmonary/Chest: Effort normal and breath sounds normal.  Abdominal:  B/l nephrostomy tubes and foley in place. Abdomen is firm. Non distended  Neurological: He is alert and oriented to person, place, and time.  Skin: Skin is warm and dry.     CMP Latest Ref Rng & Units 04/11/2018  Glucose 70 - 99 mg/dL 98  BUN 6 - 20 mg/dL 15  Creatinine 0.61 - 1.24 mg/dL 1.65(H)  Sodium 135 - 145 mmol/L 138  Potassium 3.5 - 5.1 mmol/L 3.8  Chloride 98 - 111 mmol/L 109  CO2 22 - 32 mmol/L 20(L)  Calcium 8.9 - 10.3 mg/dL 8.3(L)  Total Protein 6.5 - 8.1 g/dL -  Total Bilirubin 0.3 - 1.2 mg/dL -  Alkaline Phos 38 - 126 U/L -  AST 15 - 41 U/L -  ALT 0 - 44 U/L -   CBC Latest Ref Rng & Units 04/09/2018  WBC 3.8 - 10.6 K/uL 2.0(L)  Hemoglobin 13.0 - 18.0 g/dL 10.8(L)  Hematocrit 40.0 - 52.0 % 30.2(L)  Platelets 150 - 440 K/uL 162    @IMAGES @  [place] Intraoperative  Result Date: 04/09/2018 CLINICAL DATA:  Ultrasound was provided for use by the ordering physician, and a technical charge was applied by the performing facility.  No radiologist interpretation/professional services rendered.   US Renal  Result Date: 04/08/2018 CLINICAL DATA:  Hydronephrosis EXAM: RENAL / URINARY TRACT ULTRASOUND COMPLETE COMPARISON:  04/04/2018 FINDINGS: Right Kidney: Length: 12.6 cm. Mild hydronephrosis is noted increased from the prior CT examination. Left Kidney: Length: 13.6 cm. Hydronephrosis is noted similar to that seen on prior CT examination. Bladder: Decompressed by Foley catheter. Mild ascites is noted similar to that seen on prior CT. IMPRESSION: Mild bilateral hydronephrosis somewhat more prominent on the right when compared with the prior CT. The left hydronephrosis is stable. Note is made of ascites similar to that seen on prior CT. Some changes are noted in the abdominal cavity consistent with the omental disease seen on prior CT.  Electronically Signed   By: Inez Catalina M.D.   On: 04/08/2018 09:43   US Renal  Result Date: 04/04/2018 CLINICAL DATA:  37 year old male with acute renal insufficiency. History of B-cell lymphoma. EXAM: RENAL / URINARY TRACT ULTRASOUND COMPLETE COMPARISON:  CT Abdomen and Pelvis 12/27/2017. FINDINGS: Right Kidney: Length: 13.9 centimeters. Echogenicity within normal limits. No mass or hydronephrosis visualized. Left Kidney: Length: 13.5 centimeters. There is mild hydronephrosis (image 31) which is new. Otherwise normal ultrasound appearance of the left kidney. Postvoid images of the left kidney appear unchanged (image 56). Bladder: Small bladder volume. Nonspecific mild to moderate bladder wall thickening (image 51). No urinary debris. Other findings: Trace free fluid about the liver, Morrison's pouch (image 6). IMPRESSION: 1. Mild left hydronephrosis is new since 12/27/2017, etiology unclear. There is mild to moderate nonspecific bladder wall thickening. 2. The right kidney appears normal. 3. Small volume of nonspecific perihepatic free fluid/ascites. Electronically Signed   By: Genevie Ann M.D.   On: 04/04/2018 22:53   Ct Renal Stone Study  Result Date: 04/08/2018 CLINICAL DATA:  Inpatient. 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 Follow-up hydronephrosis. EXAM: CT ABDOMEN AND PELVIS WITHOUT CONTRAST TECHNIQUE: Multidetector CT imaging of the abdomen and pelvis was performed following the standard protocol without IV contrast. COMPARISON:  04/04/2018 CT abdomen/pelvis. 04/08/2018 renal sonogram. FINDINGS: Lower chest: Small dependent right pleural effusion is increased. Trace dependent left pleural effusion is new. Stable coarsely calcified granulomatous left hilar nodes and left lower lobe subcentimeter granuloma. Dependent bibasilar atelectasis, moderate on the right and mild on the left, increased bilaterally. Hepatobiliary: Normal liver  size. Redemonstration of solid 6.0 x 4.1 cm mass in the intersegmental fissure (series 2/image 28), previously 5.5 x 4.1 cm, slightly increased. Nondistended gallbladder. Stable diffuse gallbladder wall thickening. Poorly marginated 8.1 x 7.2 cm solid soft tissue mass in the right upper quadrant abutting the inferior gallbladder (series 2/image 45), increased from 7.5 x 5.8 cm. No radiopaque cholelithiasis. No biliary ductal dilatation. Pancreas: Normal, with no mass or duct dilation. Spleen: New mild splenomegaly (craniocaudal splenic length 14.3 cm, increased from 12.3 cm). No discrete splenic mass. Adrenals/Urinary Tract: Normal adrenals. Mild bilateral hydroureteronephrosis, worsened  on the right and stable on the left since 04/04/2018 CT. No renal or ureteral stones. No contour deforming renal mass. Bladder collapsed by indwelling Foley catheter. Stable diffuse bladder wall thickening with stable 3.0 cm anterior left bladder wall mass (series 2/image 96). Stomach/Bowel: Normal non-distended stomach. Normal caliber small bowel with no small bowel wall thickening. Normal appendix. Normal large bowel with no diverticulosis, large bowel wall thickening or pericolonic fat stranding. Vascular/Lymphatic: Atherosclerotic nonaneurysmal abdominal aorta. No pathologically enlarged lymph nodes in the abdomen or pelvis. Reproductive: Massively enlarged prostate measuring 10.1 cm in width, increased from 9.7 cm. Fat stranding surrounding enlarged prostate, which encases the distal pelvic ureters bilaterally. Other: No pneumoperitoneum. Soft tissue caking throughout the omentum appears subjectively slightly increased. Small to moderate volume ascites, increased. Diffuse thickening of the mesenteric folds and parietal peritoneum, most apparent in the right lower quadrant (series 2/image 86). Musculoskeletal: No aggressive appearing focal osseous lesions. Moderate thoracolumbar spondylosis. IMPRESSION: 1. Progression of disease  in the short interval since 04/04/2018 CT, suggesting an aggressive process, probably recurrent lymphoma. Right upper quadrant mass abutting the inferior gallbladder and mass in the intersegmental fissure of the left liver lobe have increased in size. Soft tissue caking throughout the omentum and diffuse peritoneal thickening have increased. Small to moderate volume ascites, increased. Massive prostate enlargement, increased. Stable left anterior bladder mass. New mild splenomegaly. 2. 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 3. Increased small dependent right pleural effusion. New trace dependent left pleural effusion. Worsened dependent bibasilar atelectasis. Electronically Signed   By: Ilona Sorrel M.D.   On: 04/08/2018 18:41   Ct Renal Stone Study  Result Date: 04/05/2018 CLINICAL DATA:  Abdominal pain pressure with urination, decreased urination EXAM: CT ABDOMEN AND PELVIS WITHOUT CONTRAST TECHNIQUE: Multidetector CT imaging of the abdomen and pelvis was performed following the standard protocol without IV contrast. COMPARISON:  Ultrasound 04/04/2018, CT 12/27/2017 FINDINGS: Lower chest: Lung bases demonstrate calcified left lower lobe granuloma. Calcified left infrahilar lymph nodes. Heart size within normal limits. Trace right pleural effusion. Hepatobiliary: No focal hepatic abnormality. Negative for biliary enlargement. Significant wall thickening of the gallbladder. No calcified stones. Masslike area measuring 5.5 x 4.1 cm at the falciform ligament. Pancreas: Unremarkable. No pancreatic ductal dilatation or surrounding inflammatory changes. Spleen: No focal abnormality.  Slightly enlarged at 15 cm. Adrenals/Urinary Tract: Adrenal glands are within normal limits. Mild left hydronephrosis. No ureteral stone. Diffuse bladder wall thickening with nodularity  along the anterior wall on the left side. Stomach/Bowel: The stomach is nonenlarged. No dilated small bowel. No colon wall thickening. Vascular/Lymphatic: Mild aortic atherosclerosis. No aneurysmal dilatation. Slight increased size portal caval node measuring 22 mm compared with 15 mm previously. Reproductive: Interval marked enlargement of the prostate gland, now measuring 9.3 cm transverse by 6.3 cm AP. Other: No free air. Small amount of ascites within the abdomen and pelvis. Fluid infiltration along the mesentery. Diffuse nodular thickening of the anterior peritoneal cavity. Small amount of ascites around the liver and spleen. Suspected mass in the right upper quadrant of the abdomen measuring 7.5 by 5.8 cm. Musculoskeletal: No acute or suspicious abnormality. IMPRESSION: 1. Mild left hydronephrosis and hydroureter, but no ureteral stone visible. 2. Interim development of diffuse infiltration and thickening of the peritoneal cavity, appearance of which suggests carcinomatosis or lymphomatosis, given history of prior lymphoma. Development of masslike region along the falciform ligament, in  addition to a 7.5 cm right upper quadrant intraperitoneal mass, also suspicious for metastatic disease/possible lymphoma recurrence. 3. Diffuse gallbladder wall thickening, can also be seen in the setting of lymphoma. Cholecystitis may also be considered. 4. Interval massive enlargement of the prostate, can also be seen in the setting of lymphoma. 5. Diffuse bladder wall thickening with masslike appearance along the left anterior wall. 6. Small right pleural effusion. Electronically Signed   By: Donavan Foil M.D.   On: 04/05/2018 00:34   Ir Nephrostomy Placement Left  Result Date: 04/09/2018 CLINICAL DATA:  Diffuse large B-cell lymphoma, prostatic enlargement. Mild bilateral hydroureteronephrosis, worsened on the right and stable on the left, probably due to extrinsic neoplastic obstruction at the level of the distal  pelvic ureters bilaterally. worsening renal function. Percutaneous drainage requested. EXAM: BILATERAL PERCUTANEOUS NEPHROSTOMY CATHETER PLACEMENT UNDER ULTRASOUND AND FLUOROSCOPIC GUIDANCE FLUOROSCOPY TIME:  1.8 minutes; 103 mGy TECHNIQUE: The procedure, risks (including but not limited to bleeding, infection, organ damage ), benefits, and alternatives were explained to the patient. Questions regarding the procedure were encouraged and answered. The patient understands and consents to the procedure. Bilateral flank regions prepped , draped in usual sterile fashion, infiltrated locally with 1% lidocaine. As antibiotic prophylaxis, Cipro 400 mg was ordered pre-procedure and administered intravenously within one hour of incision. Intravenous Fentanyl and Versed were administered as conscious sedation during continuous monitoring of the patient's level of consciousness and physiological / cardiorespiratory status by the radiology RN, with a total moderate sedation time of 30 minutes. Under real-time ultrasound guidance, a 21-gauge trocar needle was advanced into a posterior lower pole calyx of the right kidney. Ultrasound image documentation was saved. Urine spontaneously returned through the needle. Needle was exchanged over a guidewire for transitional dilator. Contrast injection confirmed appropriate positioning. Catheter was exchanged over a guidewire for a 10 French pigtail catheter, formed centrally within the right renal collecting system. Contrast injection confirms appropriate positioning and patency. In similar fashion, Under real-time ultrasound guidance, a 21-gauge trocar needle was advanced into a posterior lower pole calyx of the left kidney. Ultrasound image documentation was saved. Urine spontaneously returned through the needle. Needle was exchanged over a guidewire for transitional dilator. Contrast injection confirmed appropriate positioning. Catheter was exchanged over a guidewire for a 10 French  pigtail catheter, formed centrally within the left renal collecting system. Contrast injection confirms appropriate positioning and patency. Both catheters secured externally with 0 Prolene suture and StatLock, and placed to external drain bag. The patient tolerated the procedure well. COMPLICATIONS: COMPLICATIONS none IMPRESSION: 1. Technically successful bilateral percutaneous nephrostomy catheter placement. Electronically Signed   By: Lucrezia Europe M.D.   On: 04/09/2018 16:21   Ir Nephrostomy Placement Right  Result Date: 04/09/2018 CLINICAL DATA:  Diffuse large B-cell lymphoma, prostatic enlargement. 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 worsening renal function. Percutaneous drainage requested. EXAM: BILATERAL PERCUTANEOUS NEPHROSTOMY CATHETER PLACEMENT UNDER ULTRASOUND AND FLUOROSCOPIC GUIDANCE FLUOROSCOPY TIME:  1.8 minutes; 103 mGy TECHNIQUE: The procedure, risks (including but not limited to bleeding, infection, organ damage ), benefits, and alternatives were explained to the patient. Questions regarding the procedure were encouraged and answered. The patient understands and consents to the procedure. Bilateral flank regions prepped , draped in usual sterile fashion, infiltrated locally with 1% lidocaine. As antibiotic prophylaxis, Cipro 400 mg was ordered pre-procedure and administered intravenously within one hour of incision. Intravenous Fentanyl and  Versed were administered as conscious sedation during continuous monitoring of the patient's level of consciousness and physiological / cardiorespiratory status by the radiology RN, with a total moderate sedation time of 30 minutes. Under real-time ultrasound guidance, a 21-gauge trocar needle was advanced into a posterior lower pole calyx of the right kidney. Ultrasound image documentation was saved. Urine  spontaneously returned through the needle. Needle was exchanged over a guidewire for transitional dilator. Contrast injection confirmed appropriate positioning. Catheter was exchanged over a guidewire for a 10 French pigtail catheter, formed centrally within the right renal collecting system. Contrast injection confirms appropriate positioning and patency. In similar fashion, Under real-time ultrasound guidance, a 21-gauge trocar needle was advanced into a posterior lower pole calyx of the left kidney. Ultrasound image documentation was saved. Urine spontaneously returned through the needle. Needle was exchanged over a guidewire for transitional dilator. Contrast injection confirmed appropriate positioning. Catheter was exchanged over a guidewire for a 10 French pigtail catheter, formed centrally within the left renal collecting system. Contrast injection confirms appropriate positioning and patency. Both catheters secured externally with 0 Prolene suture and StatLock, and placed to external drain bag. The patient tolerated the procedure well. COMPLICATIONS: COMPLICATIONS none IMPRESSION: 1. Technically successful bilateral percutaneous nephrostomy catheter placement. Electronically Signed   By: Lucrezia Europe M.D.   On: 04/09/2018 16:21   US Abdomen Limited Ruq  Result Date: 04/05/2018 CLINICAL DATA:  Gallbladder wall thickening on CT. EXAM: ULTRASOUND ABDOMEN LIMITED RIGHT UPPER QUADRANT COMPARISON:  CT abdomen pelvis 04/04/2018 FINDINGS: Gallbladder: No gallstones are demonstrated. There is diffuse thickening and edema of the gallbladder wall with gallbladder wall thickness measuring up to 9 mm. Echogenic appearance of the bile suggesting sludge. Gallbladder wall thickening is nonspecific and could indicate acalculous cholecystitis, lymphoma, or may be related to liver disease, heart disease, or hypoproteinemia. Common bile duct: Diameter: 3 mm, normal Liver: No focal lesion identified. Within normal limits in  parenchymal echogenicity. The mass like structure seen inferior to the liver is not identified specifically sonographically. Small amount of free fluid demonstrated around the liver. Portal vein is patent on color Doppler imaging with normal direction of blood flow towards the liver. IMPRESSION: 1. Prominent diffuse thickening of the gallbladder wall with edema and gallbladder sludge. No stones identified. This appearance is nonspecific in could indicate acalculous cholecystitis, lymphoma, or changes due to liver disease, heart disease, or hypoproteinemia. 2. Small amount of free fluid around the liver. 3. Mass adjacent to the liver seen at CT is not identified sonographically for correlation. Electronically Signed   By: Lucienne Capers M.D.   On: 04/05/2018 02:37     Assessment and plan- Patient is a 37 y.o. 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 Still awaiting transfer to [facility]. He needs a repeat biopsy and prompt treatment. I spoke to Dr. Dicie Beam about the possibility of getting a biopsy here while awaiting transfer. As we are heading towards a weekend, this may not expedite his treatment in the long run. I am still hoping he can be transferred to [facility] over the weekend and get his further work up and treatments there.  2. No lab evidence of TLS. Uric acid and potassium are normal. Check K, phos, uric acid daily. AKI partly due to obstructive uropathy. He is +8L since admission. Decrease IVF to 100 cc/hr  3. Abdominal discomfort due to peritoneal involvement. No significant ascites noted on exam  and scans. If abdominal distension is worse, consider getting repeat USG to see if there is ascites.  Will continue to follow     Visit Diagnosis 1. Lymphoma of intra-abdominal lymph nodes, unspecified lymphoma type ([place])   2. Acute renal failure, unspecified acute renal failure type ([place])   3. Enlarged prostate   4. Abdominal mass,  RUQ (right upper quadrant)   5. Thickening of wall of gallbladder   6. Generalized abdominal pain   7. At high risk of tumor lysis syndrome   8. Hydronephrosis      Dr. Randa Evens, MD, MPH [facility] [phone]

## 2018-04-11 NOTE — Progress Notes (Signed)
This RN assumed care of pt at 1530. Pt resting in bed, states he is "feeling much better" after his paracentesis. Diastolic BPs have been high 90-100s today. Dr. Bridgett Larsson notified- no orders received.   Mark Burns, Mark Burns

## 2018-04-11 NOTE — Progress Notes (Signed)
Post nephrostogram with Dr. Pascal Lux, pt. Taken to Ultrasound for possible paracentesis. MD to evaluate pt. In [place] and determine need of procedure. Pt. Stable for transfer. Pt. Abdomen distended and "very uncomfortable " per pt.

## 2018-04-11 NOTE — Progress Notes (Addendum)
Mark Burns at [place] NAME: Mark Burns    MR#:  [id number]  DATE OF BIRTH:  1981-08-04  SUBJECTIVE:  CHIEF COMPLAINT:   Abdominal discomfort and distension.  S/p b/l nephrostomy. REVIEW OF SYSTEMS:  CONSTITUTIONAL: No fever, fatigue or weakness.  EYES: No blurred or double vision.  EARS, NOSE, AND THROAT: No tinnitus or ear pain.  RESPIRATORY: No cough, shortness of breath, wheezing or hemoptysis.  CARDIOVASCULAR: No chest pain, orthopnea, edema.  GASTROINTESTINAL: No nausea, vomiting, diarrhea or abdominal pain.  GENITOURINARY: No dysuria, hematuria.  ENDOCRINE: No polyuria, nocturia,  HEMATOLOGY: No anemia, easy bruising or bleeding SKIN: No rash or lesion. MUSCULOSKELETAL: No joint pain or arthritis.   NEUROLOGIC: No tingling, numbness, weakness.  PSYCHIATRY: No anxiety or depression.   DRUG ALLERGIES:   Allergies  Allergen Reactions  . Sulfa Antibiotics Hives    VITALS:  Blood pressure (!) 156/99, pulse (!) 105, temperature 98 F (36.7 C), temperature source Oral, resp. rate 17, height 6\' 2"  (1.88 m), weight 251 lb (113.9 kg), SpO2 92 %.  PHYSICAL EXAMINATION:  GENERAL:  37 y.o.-year-old patient lying in the bed with no acute distress.  Obesity. EYES: Pupils equal, round, reactive to light and accommodation. No scleral icterus. Extraocular muscles intact.  HEENT: Head atraumatic, normocephalic. Oropharynx and nasopharynx clear.  NECK:  Supple, no jugular venous distention. No thyroid enlargement, no tenderness.  LUNGS: Normal breath sounds bilaterally, no wheezing, rales,rhonchi or crepitation. No use of accessory muscles of respiration.  CARDIOVASCULAR: S1, S2 normal. No murmurs, rubs, or gallops.  ABDOMEN: Soft, nontender, distended, + ascites sign. Bowel sounds present. No organomegaly or mass. S/p b/l nephrostomy with bloody drainage. EXTREMITIES: No pedal edema, cyanosis, or clubbing.  NEUROLOGIC: Cranial nerves  II through XII are intact. Muscle strength 5/5 in all extremities. Sensation intact. Gait not checked.  PSYCHIATRIC: The patient is alert and oriented x 3.  SKIN: No obvious rash, lesion, or ulcer.  LABORATORY PANEL:   CBC Recent Labs  Lab 04/09/18 0655  WBC 2.0*  HGB 10.8*  HCT 30.2*  PLT 162   ------------------------------------------------------------------------------------------------------------------  Chemistries  Recent Labs  Lab 04/06/18 0533  04/09/18 0655  04/11/18 0720  NA 140   < > 138   < > 138  K 3.9   < > 3.9   < > 3.8  CL 109   < > 110   < > 109  CO2 22   < > 20*   < > 20*  GLUCOSE 98   < > 77   < > 98  BUN 17   < > 18   < > 15  CREATININE 1.48*   < > 2.59*   < > 1.65*  CALCIUM 8.3*   < > 8.3*   < > 8.3*  MG 2.0  --   --   --   --   AST 35  --  36  --   --   ALT 29  --  24  --   --   ALKPHOS 64  --  69  --   --   BILITOT 0.6  --  0.8  --   --    < > = values in this interval not displayed.   ------------------------------------------------------------------------------------------------------------------  Cardiac Enzymes No results for input(s): TROPONINI in the last 168 hours. ------------------------------------------------------------------------------------------------------------------  RADIOLOGY:  [place] Intraoperative  Result Date: 04/09/2018 CLINICAL DATA:  Ultrasound was provided for use by the ordering  physician, and a technical charge was applied by the performing facility.  No radiologist interpretation/professional services rendered.   Ir Nephrostomy Placement Left  Result Date: 04/09/2018 CLINICAL DATA:  Diffuse large B-cell lymphoma, prostatic enlargement. 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 worsening renal function. Percutaneous drainage requested. EXAM: BILATERAL PERCUTANEOUS NEPHROSTOMY  CATHETER PLACEMENT UNDER ULTRASOUND AND FLUOROSCOPIC GUIDANCE FLUOROSCOPY TIME:  1.8 minutes; 103 mGy TECHNIQUE: The procedure, risks (including but not limited to bleeding, infection, organ damage ), benefits, and alternatives were explained to the patient. Questions regarding the procedure were encouraged and answered. The patient understands and consents to the procedure. Bilateral flank regions prepped , draped in usual sterile fashion, infiltrated locally with 1% lidocaine. As antibiotic prophylaxis, Cipro 400 mg was ordered pre-procedure and administered intravenously within one hour of incision. Intravenous Fentanyl and Versed were administered as conscious sedation during continuous monitoring of the patient's level of consciousness and physiological / cardiorespiratory status by the radiology RN, with a total moderate sedation time of 30 minutes. Under real-time ultrasound guidance, a 21-gauge trocar needle was advanced into a posterior lower pole calyx of the right kidney. Ultrasound image documentation was saved. Urine spontaneously returned through the needle. Needle was exchanged over a guidewire for transitional dilator. Contrast injection confirmed appropriate positioning. Catheter was exchanged over a guidewire for a 10 French pigtail catheter, formed centrally within the right renal collecting system. Contrast injection confirms appropriate positioning and patency. In similar fashion, Under real-time ultrasound guidance, a 21-gauge trocar needle was advanced into a posterior lower pole calyx of the left kidney. Ultrasound image documentation was saved. Urine spontaneously returned through the needle. Needle was exchanged over a guidewire for transitional dilator. Contrast injection confirmed appropriate positioning. Catheter was exchanged over a guidewire for a 10 French pigtail catheter, formed centrally within the left renal collecting system. Contrast injection confirms appropriate positioning  and patency. Both catheters secured externally with 0 Prolene suture and StatLock, and placed to external drain bag. The patient tolerated the procedure well. COMPLICATIONS: COMPLICATIONS none IMPRESSION: 1. Technically successful bilateral percutaneous nephrostomy catheter placement. Electronically Signed   By: Lucrezia Europe M.D.   On: 04/09/2018 16:21   Ir Nephrostomy Placement Right  Result Date: 04/09/2018 CLINICAL DATA:  Diffuse large B-cell lymphoma, prostatic enlargement. 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 worsening renal function. Percutaneous drainage requested. EXAM: BILATERAL PERCUTANEOUS NEPHROSTOMY CATHETER PLACEMENT UNDER ULTRASOUND AND FLUOROSCOPIC GUIDANCE FLUOROSCOPY TIME:  1.8 minutes; 103 mGy TECHNIQUE: The procedure, risks (including but not limited to bleeding, infection, organ damage ), benefits, and alternatives were explained to the patient. Questions regarding the procedure were encouraged and answered. The patient understands and consents to the procedure. Bilateral flank regions prepped , draped in usual sterile fashion, infiltrated locally with 1% lidocaine. As antibiotic prophylaxis, Cipro 400 mg was ordered pre-procedure and administered intravenously within one hour of incision. Intravenous Fentanyl and Versed were administered as conscious sedation during continuous monitoring of the patient's level of consciousness and physiological / cardiorespiratory status by the radiology RN, with a total moderate sedation time of 30 minutes. Under real-time ultrasound guidance, a 21-gauge trocar needle was advanced into a posterior lower pole calyx of the right kidney. Ultrasound image documentation was saved. Urine spontaneously returned through the needle. Needle was exchanged  over a guidewire for transitional dilator. Contrast injection  confirmed appropriate positioning. Catheter was exchanged over a guidewire for a 10 French pigtail catheter, formed centrally within the right renal collecting system. Contrast injection confirms appropriate positioning and patency. In similar fashion, Under real-time ultrasound guidance, a 21-gauge trocar needle was advanced into a posterior lower pole calyx of the left kidney. Ultrasound image documentation was saved. Urine spontaneously returned through the needle. Needle was exchanged over a guidewire for transitional dilator. Contrast injection confirmed appropriate positioning. Catheter was exchanged over a guidewire for a 10 French pigtail catheter, formed centrally within the left renal collecting system. Contrast injection confirms appropriate positioning and patency. Both catheters secured externally with 0 Prolene suture and StatLock, and placed to external drain bag. The patient tolerated the procedure well. COMPLICATIONS: COMPLICATIONS none IMPRESSION: 1. Technically successful bilateral percutaneous nephrostomy catheter placement. Electronically Signed   By: Lucrezia Europe M.D.   On: 04/09/2018 16:21    EKG:   Orders placed or performed during the hospital encounter of 12/26/17  . ED EKG 12-Lead  . ED EKG 12-Lead  . EKG 12-Lead  . EKG 12-Lead  . EKG    ASSESSMENT AND PLAN:   #Acute kidney injury-postrenal-with left-sided mild hydronephrosis and hydroureter probably from  prostate enlargement IV fluids, avoid nephrotoxins, Foley catheter better creatinine.  Follow-up BMP.  * Left hydronephrosis:  Renal US Today shows Mild bilateral hydronephrosis somewhat more prominent on the right when compared with the prior CT. The left hydronephrosis is stable S/p b/l nephrostomy. Bloody drainage but no urine out from left tube. Urine from right tube.  Discussed with Dr. Vernard Gambles, who will replace tube today.  #Clinical lymphoma recurrence - Significant elevated LDH.  abnormal CT findings  concerning for lymphoma recurrence. Dr Janese Banks recommends transfer to [facility] under Dr.Beaven  Ascites, possible due to lymphoma.  US guided paracentesis.  #Hyperuricemia, patient is at high risk of tumor lysis syndrome. Continue allopurinol  - Aggressive IV hydration.Onco has given 1 dose of rasburicase y'day - Uric acid has normalized - Dr.Beaven requests that at discharge patient to bring CD with a copy of his CT image which may expedite his care at [facility].   #Hypokalemia and hypomagnesemia Repleted and improved.   #transaminitis Improved.  #History of gout no flareups at this time  DVT prophylaxis with heparin subcu  Still waiting for transfer to [facility] ([place]).  All the records are reviewed and case discussed with Care Management/Social Workerr. Management plans discussed with the patient, Dr. Vernard Gambles, radiologist, and they are in agreement.  CODE STATUS: FULL CODE  TOTAL TIME TAKING CARE OF THIS PATIENT: 42 minutes.   POSSIBLE D/C IN 2  DAYS, DEPENDING ON CLINICAL CONDITION. Urology and onco eval  Note: This dictation was prepared with Dragon dictation along with smaller phrase technology. Any transcriptional errors that result from this process are unintentional.   Demetrios Loll M.D on 04/11/2018 at 1:38 PM  Between 7am to 6pm - Pager - [phone] After 6pm go to [email or web address] - password [email or web address]  Office  [phone]  CC: Primary care physician; Venia Carbon, MD

## 2018-04-11 NOTE — Progress Notes (Signed)
Hematology/Oncology Consult note [facility]  Telephone:([phone] [phone]  Patient Care Team: Venia Carbon, MD as PCP - General (Internal Medicine)   Name of the patient: Mark Burns  [id number]  1981-09-15   Date of visit: 04/11/2018   Interval history- 3.6 L ascites fluid drained today. Patient feels littelt better since then. He is trying to sit up and walk around  ECOG PS- 0 Pain scale- 3   Review of systems- Review of Systems  Constitutional: Positive for malaise/fatigue. Negative for chills, fever and weight loss.  HENT: Negative for congestion, ear discharge and nosebleeds.   Eyes: Negative for blurred vision.  Respiratory: Negative for cough, hemoptysis, sputum production, shortness of breath and wheezing.   Cardiovascular: Negative for chest pain, palpitations, orthopnea and claudication.  Gastrointestinal: Positive for abdominal pain. Negative for blood in stool, constipation, diarrhea, heartburn, melena, nausea and vomiting.  Genitourinary: Negative for dysuria, flank pain, frequency, hematuria and urgency.  Musculoskeletal: Negative for back pain, joint pain and myalgias.  Skin: Negative for rash.  Neurological: Negative for dizziness, tingling, focal weakness, seizures, weakness and headaches.  Endo/Heme/Allergies: Does not bruise/bleed easily.  Psychiatric/Behavioral: Negative for depression and suicidal ideas. The patient does not have insomnia.       Allergies  Allergen Reactions  . Sulfa Antibiotics Hives     Past Medical History:  Diagnosis Date  . ADHD, predominantly inattentive type   . Diffuse large B cell lymphoma ([place])   . Elevated LFTs 11/19/2013  . Erectile dysfunction   . Generalized anxiety disorder   . Gout   . Hypertension   . Recurrent cold sores   . Seborrheic dermatitis      Past Surgical History:  Procedure Laterality Date  . IR NEPHROSTOMY PLACEMENT LEFT  04/09/2018  . IR  NEPHROSTOMY PLACEMENT LEFT  04/11/2018  . IR NEPHROSTOMY PLACEMENT RIGHT  04/09/2018  . VASECTOMY  2014    Social History   Socioeconomic History  . Marital status: Married    Spouse name: Not on file  . Number of children: 2  . Years of education: Not on file  . Highest education level: Not on file  Occupational History  . Occupation: PE Pharmacist, hospital    Comment[employer]ensed [employer]  Social Needs  . Financial resource strain: Not on file  . Food insecurity:    Worry: Not on file    Inability: Not on file  . Transportation needs:    Medical: Not on file    Non-medical: Not on file  Tobacco Use  . Smoking status: Never Smoker  . Smokeless tobacco: Never Used  Substance and Sexual Activity  . Alcohol use: Yes    Comment: daily use   . Drug use: No  . Sexual activity: Not on file  Lifestyle  . Physical activity:    Days per week: Not on file    Minutes per session: Not on file  . Stress: Not on file  Relationships  . Social connections:    Talks on phone: Not on file    Gets together: Not on file    Attends religious service: Not on file    Active member of club or organization: Not on file    Attends meetings of clubs or organizations: Not on file    Relationship status: Not on file  . Intimate partner violence:    Fear of current or ex partner: Not on file    Emotionally abused: Not on  file    Physically abused: Not on file    Forced sexual activity: Not on file  Other Topics Concern  . Not on file  Social History Narrative   Played college football, married to "Judson Roch."     History reviewed. No pertinent family history.   Current Facility-Administered Medications:  .  0.9 %  sodium chloride infusion, , Intravenous, Continuous, Demetrios Loll, MD, Last Rate: 100 mL/hr at 04/10/18 2158, 100 mL/hr at 04/10/18 2158 .  acetaminophen (TYLENOL) tablet 650 mg, 650 mg, Oral, Q6H PRN, 650 mg at 04/09/18 0458 **OR** acetaminophen (TYLENOL) suppository 650 mg, 650 mg, Rectal,  Q6H PRN, Jodell Cipro, Prasanna, MD .  alfuzosin (UROXATRAL) 24 hr tablet 10 mg, 10 mg, Oral, Q breakfast, McKenzie, Candee Furbish, MD, 10 mg at 04/11/18 0734 .  allopurinol (ZYLOPRIM) tablet 100 mg, 100 mg, Oral, TID, Earlie Server, MD, 100 mg at 04/11/18 0946 .  ALPRAZolam Duanne Moron) tablet 0.5 mg, 0.5 mg, Oral, TID PRN, Max Sane, MD, 0.5 mg at 04/11/18 0945 .  amLODipine (NORVASC) tablet 5 mg, 5 mg, Oral, Daily, Demetrios Loll, MD, 5 mg at 04/11/18 0945 .  bisacodyl (DULCOLAX) EC tablet 5 mg, 5 mg, Oral, Daily PRN, Arta Silence, MD .  butalbital-acetaminophen-caffeine (FIORICET, ESGIC) 50-325-40 MG per tablet 1 tablet, 1 tablet, Oral, Q6H PRN, Demetrios Loll, MD, 1 tablet at 04/11/18 1242 .  DULoxetine (CYMBALTA) DR capsule 90 mg, 90 mg, Oral, Daily, Sridharan, Prasanna, MD, 90 mg at 04/11/18 0946 .  heparin injection 5,000 Units, 5,000 Units, Subcutaneous, Q8H, Arta Silence, MD, 5,000 Units at 04/11/18 1514 .  morphine 2 MG/ML injection 2 mg, 2 mg, Intravenous, Q3H PRN, 2 mg at 04/11/18 1514 **OR** [DISCONTINUED] morphine 4 MG/ML injection 4 mg, 4 mg, Intravenous, Q3H PRN, Arta Silence, MD, 4 mg at 04/06/18 0829 .  ondansetron (ZOFRAN) tablet 4 mg, 4 mg, Oral, Q6H PRN, 4 mg at 04/06/18 0947 **OR** ondansetron (ZOFRAN) injection 4 mg, 4 mg, Intravenous, Q6H PRN, Arta Silence, MD, 4 mg at 04/11/18 1514 .  oxyCODONE-acetaminophen (PERCOCET/ROXICET) 5-325 MG per tablet 1 tablet, 1 tablet, Oral, Q6H PRN, Hillary Bow, MD, 1 tablet at 04/11/18 0733 .  senna-docusate (Senokot-S) tablet 1 tablet, 1 tablet, Oral, QHS PRN, Arta Silence, MD  Physical exam:  Vitals:   04/11/18 0754 04/11/18 1300 04/11/18 1355 04/11/18 1433  BP: (!) 159/91 (!) 156/99 (!) 155/99 (!) 154/100  Pulse: 95 (!) 105 100 (!) 106  Resp: 20 17    Temp: 97.8 F (36.6 C) 98 F (36.7 C)    TempSrc: Oral Oral    SpO2: 96% 92% 93% 94%  Weight:  251 lb (113.9 kg)    Height:  6\' 2"  (1.88 m)     Physical Exam    Constitutional: He is oriented to person, place, and time. He appears well-developed and well-nourished.  HENT:  Head: Normocephalic and atraumatic.  Eyes: Pupils are equal, round, and reactive to light. EOM are normal.  Neck: Normal range of motion.  Cardiovascular: Normal rate, regular rhythm and normal heart sounds.  Pulmonary/Chest: Effort normal and breath sounds normal.  Abdominal: Bowel sounds are normal.  Firm non distended  Neurological: He is alert and oriented to person, place, and time.  Skin: Skin is warm and dry.     CMP Latest Ref Rng & Units 04/11/2018  Glucose 70 - 99 mg/dL 98  BUN 6 - 20 mg/dL 15  Creatinine 0.61 - 1.24 mg/dL 1.65(H)  Sodium 135 - 145 mmol/L 138  Potassium 3.5 - 5.1 mmol/L 3.8  Chloride 98 - 111 mmol/L 109  CO2 22 - 32 mmol/L 20(L)  Calcium 8.9 - 10.3 mg/dL 8.3(L)  Total Protein 6.5 - 8.1 g/dL -  Total Bilirubin 0.3 - 1.2 mg/dL -  Alkaline Phos 38 - 126 U/L -  AST 15 - 41 U/L -  ALT 0 - 44 U/L -   CBC Latest Ref Rng & Units 04/09/2018  WBC 3.8 - 10.6 K/uL 2.0(L)  Hemoglobin 13.0 - 18.0 g/dL 10.8(L)  Hematocrit 40.0 - 52.0 % 30.2(L)  Platelets 150 - 440 K/uL 162    @IMAGES @  [place] Intraoperative  Result Date: 04/09/2018 CLINICAL DATA:  Ultrasound was provided for use by the ordering physician, and a technical charge was applied by the performing facility.  No radiologist interpretation/professional services rendered.   US Renal  Result Date: 04/08/2018 CLINICAL DATA:  Hydronephrosis EXAM: RENAL / URINARY TRACT ULTRASOUND COMPLETE COMPARISON:  04/04/2018 FINDINGS: Right Kidney: Length: 12.6 cm. Mild hydronephrosis is noted increased from the prior CT examination. Left Kidney: Length: 13.6 cm. Hydronephrosis is noted similar to that seen on prior CT examination. Bladder: Decompressed by Foley catheter. Mild ascites is noted similar to that seen on prior CT. IMPRESSION: Mild bilateral hydronephrosis somewhat more prominent on the right when  compared with the prior CT. The left hydronephrosis is stable. Note is made of ascites similar to that seen on prior CT. Some changes are noted in the abdominal cavity consistent with the omental disease seen on prior CT. Electronically Signed   By: Inez Catalina M.D.   On: 04/08/2018 09:43   US Renal  Result Date: 04/04/2018 CLINICAL DATA:  37 year old male with acute renal insufficiency. History of B-cell lymphoma. EXAM: RENAL / URINARY TRACT ULTRASOUND COMPLETE COMPARISON:  CT Abdomen and Pelvis 12/27/2017. FINDINGS: Right Kidney: Length: 13.9 centimeters. Echogenicity within normal limits. No mass or hydronephrosis visualized. Left Kidney: Length: 13.5 centimeters. There is mild hydronephrosis (image 31) which is new. Otherwise normal ultrasound appearance of the left kidney. Postvoid images of the left kidney appear unchanged (image 56). Bladder: Small bladder volume. Nonspecific mild to moderate bladder wall thickening (image 51). No urinary debris. Other findings: Trace free fluid about the liver, Morrison's pouch (image 6). IMPRESSION: 1. Mild left hydronephrosis is new since 12/27/2017, etiology unclear. There is mild to moderate nonspecific bladder wall thickening. 2. The right kidney appears normal. 3. Small volume of nonspecific perihepatic free fluid/ascites. Electronically Signed   By: Genevie Ann M.D.   On: 04/04/2018 22:53   US Paracentesis  Result Date: 04/11/2018 INDICATION: Recurrent lymphoma, now with symptomatic intra-abdominal ascites. Please perform ultrasound-guided paracentesis for diagnostic and therapeutic purposes. EXAM: ULTRASOUND-GUIDED PARACENTESIS COMPARISON:  CT abdomen and pelvis - 04/08/2018 MEDICATIONS: None. COMPLICATIONS: None immediate. TECHNIQUE: Informed written consent was obtained from the patient after a discussion of the risks, benefits and alternatives to treatment. A timeout was performed prior to the initiation of the procedure. Initial ultrasound scanning  demonstrates a moderate amount of ascites within the left lower abdominal quadrant. The right lower abdomen was prepped and draped in the usual sterile fashion. 1% lidocaine with epinephrine was used for local anesthesia. An ultrasound image was saved for documentation purposed. An 8 Fr Safe-T-Centesis catheter was introduced. The paracentesis was performed. The catheter was removed and a dressing was applied. The patient tolerated the procedure well without immediate post procedural complication. FINDINGS: A total of approximately 3.6 liters of serous fluid was removed. Samples were sent to the  laboratory as requested by the clinical team. IMPRESSION: Successful ultrasound-guided paracentesis yielding 3.6 liters of peritoneal fluid. Electronically Signed   By: Sandi Mariscal M.D.   On: 04/11/2018 15:12   Ct Renal Stone Study  Result Date: 04/08/2018 CLINICAL DATA:  Inpatient. 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 Follow-up hydronephrosis. EXAM: CT ABDOMEN AND PELVIS WITHOUT CONTRAST TECHNIQUE: Multidetector CT imaging of the abdomen and pelvis was performed following the standard protocol without IV contrast. COMPARISON:  04/04/2018 CT abdomen/pelvis. 04/08/2018 renal sonogram. FINDINGS: Lower chest: Small dependent right pleural effusion is increased. Trace dependent left pleural effusion is new. Stable coarsely calcified granulomatous left hilar nodes and left lower lobe subcentimeter granuloma. Dependent bibasilar atelectasis, moderate on the right and mild on the left, increased bilaterally. Hepatobiliary: Normal liver size. Redemonstration of solid 6.0 x 4.1 cm mass in the intersegmental fissure (series 2/image 28), previously 5.5 x 4.1 cm, slightly increased. Nondistended gallbladder. Stable diffuse gallbladder wall thickening. Poorly marginated 8.1 x 7.2 cm solid soft tissue mass in the right upper quadrant abutting the inferior  gallbladder (series 2/image 45), increased from 7.5 x 5.8 cm. No radiopaque cholelithiasis. No biliary ductal dilatation. Pancreas: Normal, with no mass or duct dilation. Spleen: New mild splenomegaly (craniocaudal splenic length 14.3 cm, increased from 12.3 cm). No discrete splenic mass. Adrenals/Urinary Tract: Normal adrenals. Mild bilateral hydroureteronephrosis, worsened on the right and stable on the left since 04/04/2018 CT. No renal or ureteral stones. No contour deforming renal mass. Bladder collapsed by indwelling Foley catheter. Stable diffuse bladder wall thickening with stable 3.0 cm anterior left bladder wall mass (series 2/image 96). Stomach/Bowel: Normal non-distended stomach. Normal caliber small bowel with no small bowel wall thickening. Normal appendix. Normal large bowel with no diverticulosis, large bowel wall thickening or pericolonic fat stranding. Vascular/Lymphatic: Atherosclerotic nonaneurysmal abdominal aorta. No pathologically enlarged lymph nodes in the abdomen or pelvis. Reproductive: Massively enlarged prostate measuring 10.1 cm in width, increased from 9.7 cm. Fat stranding surrounding enlarged prostate, which encases the distal pelvic ureters bilaterally. Other: No pneumoperitoneum. Soft tissue caking throughout the omentum appears subjectively slightly increased. Small to moderate volume ascites, increased. Diffuse thickening of the mesenteric folds and parietal peritoneum, most apparent in the right lower quadrant (series 2/image 86). Musculoskeletal: No aggressive appearing focal osseous lesions. Moderate thoracolumbar spondylosis. IMPRESSION: 1. Progression of disease in the short interval since 04/04/2018 CT, suggesting an aggressive process, probably recurrent lymphoma. Right upper quadrant mass abutting the inferior gallbladder and mass in the intersegmental fissure of the left liver lobe have increased in size. Soft tissue caking throughout the omentum and diffuse peritoneal  thickening have increased. Small to moderate volume ascites, increased. Massive prostate enlargement, increased. Stable left anterior bladder mass. New mild splenomegaly. 2. 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 3. Increased small dependent right pleural effusion. New trace dependent left pleural effusion. Worsened dependent bibasilar atelectasis. Electronically Signed   By: Ilona Sorrel M.D.   On: 04/08/2018 18:41   Ct Renal Stone Study  Result Date: 04/05/2018 CLINICAL DATA:  Abdominal pain pressure with urination, decreased urination EXAM: CT ABDOMEN AND PELVIS WITHOUT CONTRAST TECHNIQUE: Multidetector CT imaging of the abdomen and pelvis was performed following the standard protocol without IV contrast. COMPARISON:  Ultrasound 04/04/2018, CT 12/27/2017 FINDINGS: Lower chest: Lung bases demonstrate calcified left lower lobe granuloma. Calcified left infrahilar  lymph nodes. Heart size within normal limits. Trace right pleural effusion. Hepatobiliary: No focal hepatic abnormality. Negative for biliary enlargement. Significant wall thickening of the gallbladder. No calcified stones. Masslike area measuring 5.5 x 4.1 cm at the falciform ligament. Pancreas: Unremarkable. No pancreatic ductal dilatation or surrounding inflammatory changes. Spleen: No focal abnormality.  Slightly enlarged at 15 cm. Adrenals/Urinary Tract: Adrenal glands are within normal limits. Mild left hydronephrosis. No ureteral stone. Diffuse bladder wall thickening with nodularity along the anterior wall on the left side. Stomach/Bowel: The stomach is nonenlarged. No dilated small bowel. No colon wall thickening. Vascular/Lymphatic: Mild aortic atherosclerosis. No aneurysmal dilatation. Slight increased size portal caval node measuring 22 mm compared with 15 mm previously. Reproductive: Interval  marked enlargement of the prostate gland, now measuring 9.3 cm transverse by 6.3 cm AP. Other: No free air. Small amount of ascites within the abdomen and pelvis. Fluid infiltration along the mesentery. Diffuse nodular thickening of the anterior peritoneal cavity. Small amount of ascites around the liver and spleen. Suspected mass in the right upper quadrant of the abdomen measuring 7.5 by 5.8 cm. Musculoskeletal: No acute or suspicious abnormality. IMPRESSION: 1. Mild left hydronephrosis and hydroureter, but no ureteral stone visible. 2. Interim development of diffuse infiltration and thickening of the peritoneal cavity, appearance of which suggests carcinomatosis or lymphomatosis, given history of prior lymphoma. Development of masslike region along the falciform ligament, in addition to a 7.5 cm right upper quadrant intraperitoneal mass, also suspicious for metastatic disease/possible lymphoma recurrence. 3. Diffuse gallbladder wall thickening, can also be seen in the setting of lymphoma. Cholecystitis may also be considered. 4. Interval massive enlargement of the prostate, can also be seen in the setting of lymphoma. 5. Diffuse bladder wall thickening with masslike appearance along the left anterior wall. 6. Small right pleural effusion. Electronically Signed   By: Donavan Foil M.D.   On: 04/05/2018 00:34   Ir Nephrostomy Placement Left  Result Date: 04/11/2018 INDICATION: History of limbs biloma with bilateral obstructive uropathy post placement bilateral percutaneous nephrostomy catheters Right-sided nephrostomy catheters been function well however there has been limited predominately bloody output from the left-sided percutaneous nephrostomy catheter. As such, request made for antegrade nephrostogram and potential exchange EXAM: ANTEGRADE LEFT-SIDED NEPHROSTOGRAM. COMPARISON:  Ultrasound and fluoroscopic guided placement of bilateral percutaneous nephrostomy catheters - 04/09/2018 CONTRAST:  10 mL  Isovue-300 administered into the left renal collecting system FLUOROSCOPY TIME:  42 seconds (53 mGy) COMPLICATIONS: None immediate. TECHNIQUE: Informed written consent was obtained from the patient after a discussion of the risks, benefits and alternatives to treatment. Questions regarding the procedure were encouraged and answered. A timeout was performed prior to the initiation of the procedure. Patient was positioned prone on the fluoroscopy table. Preprocedural spot fluoroscopic image was obtained of the left flank and existing percutaneous nephrostomy catheter. Multiple spot fluoroscopic images were obtained from the injection of small amount of contrast via the existing left-sided nephrostomy catheter Images were reviewed and the procedure was terminated. The catheter was flushed with a small amount of saline and connected to a gravity bag. Patient tolerated the procedure well immediate postprocedural complication. FINDINGS: Preprocedural spot fluoroscopic image demonstrates a left-sided percutaneous nephrostomy catheter overlying the expected location of left renal fossa. Contrast injection demonstrates appropriate position functionality of the left-sided nephrostomy catheter. There is a minimal amount of nonocclusive clot within the left renal collecting system. No evidence of left-sided pelvicaliectasis. There is brisk passage of contrast to the left ureter to the level of the urinary  bladder. IMPRESSION: Appropriately positioned and functioning left-sided percutaneous nephrostomy catheter. No exchange performed. While there is a small amount nonocclusive clot seen within the left renal collecting system, there is no evidence of left-sided pelvicaliectasis and additionally there is brisk passage of contrast through the left ureter to the level of the urinary bladder. Note, given lack of left-sided urinary obstruction, output from the left-sided percutaneous nephrostomy catheter may be variable as some urine  will drain to the left-sided nephrostomy while some will drain through the ureter to the urinary bladder and Foley catheter. Electronically Signed   By: Sandi Mariscal M.D.   On: 04/11/2018 15:09   Ir Nephrostomy Placement Left  Result Date: 04/09/2018 CLINICAL DATA:  Diffuse large B-cell lymphoma, prostatic enlargement. 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 worsening renal function. Percutaneous drainage requested. EXAM: BILATERAL PERCUTANEOUS NEPHROSTOMY CATHETER PLACEMENT UNDER ULTRASOUND AND FLUOROSCOPIC GUIDANCE FLUOROSCOPY TIME:  1.8 minutes; 103 mGy TECHNIQUE: The procedure, risks (including but not limited to bleeding, infection, organ damage ), benefits, and alternatives were explained to the patient. Questions regarding the procedure were encouraged and answered. The patient understands and consents to the procedure. Bilateral flank regions prepped , draped in usual sterile fashion, infiltrated locally with 1% lidocaine. As antibiotic prophylaxis, Cipro 400 mg was ordered pre-procedure and administered intravenously within one hour of incision. Intravenous Fentanyl and Versed were administered as conscious sedation during continuous monitoring of the patient's level of consciousness and physiological / cardiorespiratory status by the radiology RN, with a total moderate sedation time of 30 minutes. Under real-time ultrasound guidance, a 21-gauge trocar needle was advanced into a posterior lower pole calyx of the right kidney. Ultrasound image documentation was saved. Urine spontaneously returned through the needle. Needle was exchanged over a guidewire for transitional dilator. Contrast injection confirmed appropriate positioning. Catheter was exchanged over a guidewire for a 10 French pigtail catheter, formed centrally within the right renal collecting  system. Contrast injection confirms appropriate positioning and patency. In similar fashion, Under real-time ultrasound guidance, a 21-gauge trocar needle was advanced into a posterior lower pole calyx of the left kidney. Ultrasound image documentation was saved. Urine spontaneously returned through the needle. Needle was exchanged over a guidewire for transitional dilator. Contrast injection confirmed appropriate positioning. Catheter was exchanged over a guidewire for a 10 French pigtail catheter, formed centrally within the left renal collecting system. Contrast injection confirms appropriate positioning and patency. Both catheters secured externally with 0 Prolene suture and StatLock, and placed to external drain bag. The patient tolerated the procedure well. COMPLICATIONS: COMPLICATIONS none IMPRESSION: 1. Technically successful bilateral percutaneous nephrostomy catheter placement. Electronically Signed   By: Lucrezia Europe M.D.   On: 04/09/2018 16:21   Ir Nephrostomy Placement Right  Result Date: 04/09/2018 CLINICAL DATA:  Diffuse large B-cell lymphoma, prostatic enlargement. 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 worsening renal function. Percutaneous drainage requested. EXAM: BILATERAL PERCUTANEOUS NEPHROSTOMY CATHETER PLACEMENT UNDER ULTRASOUND AND FLUOROSCOPIC GUIDANCE FLUOROSCOPY TIME:  1.8 minutes; 103 mGy TECHNIQUE: The procedure, risks (including but not limited to bleeding, infection, organ damage ), benefits, and alternatives were explained to the patient. Questions regarding the procedure were encouraged and answered. The patient understands and consents to the procedure. Bilateral flank regions prepped , draped in usual sterile fashion, infiltrated  locally with 1% lidocaine. As antibiotic prophylaxis, Cipro 400 mg was ordered pre-procedure and  administered intravenously within one hour of incision. Intravenous Fentanyl and Versed were administered as conscious sedation during continuous monitoring of the patient's level of consciousness and physiological / cardiorespiratory status by the radiology RN, with a total moderate sedation time of 30 minutes. Under real-time ultrasound guidance, a 21-gauge trocar needle was advanced into a posterior lower pole calyx of the right kidney. Ultrasound image documentation was saved. Urine spontaneously returned through the needle. Needle was exchanged over a guidewire for transitional dilator. Contrast injection confirmed appropriate positioning. Catheter was exchanged over a guidewire for a 10 French pigtail catheter, formed centrally within the right renal collecting system. Contrast injection confirms appropriate positioning and patency. In similar fashion, Under real-time ultrasound guidance, a 21-gauge trocar needle was advanced into a posterior lower pole calyx of the left kidney. Ultrasound image documentation was saved. Urine spontaneously returned through the needle. Needle was exchanged over a guidewire for transitional dilator. Contrast injection confirmed appropriate positioning. Catheter was exchanged over a guidewire for a 10 French pigtail catheter, formed centrally within the left renal collecting system. Contrast injection confirms appropriate positioning and patency. Both catheters secured externally with 0 Prolene suture and StatLock, and placed to external drain bag. The patient tolerated the procedure well. COMPLICATIONS: COMPLICATIONS none IMPRESSION: 1. Technically successful bilateral percutaneous nephrostomy catheter placement. Electronically Signed   By: Lucrezia Europe M.D.   On: 04/09/2018 16:21   US Abdomen Limited Ruq  Result Date: 04/05/2018 CLINICAL DATA:  Gallbladder wall thickening on CT. EXAM: ULTRASOUND ABDOMEN LIMITED RIGHT UPPER QUADRANT COMPARISON:  CT abdomen pelvis 04/04/2018  FINDINGS: Gallbladder: No gallstones are demonstrated. There is diffuse thickening and edema of the gallbladder wall with gallbladder wall thickness measuring up to 9 mm. Echogenic appearance of the bile suggesting sludge. Gallbladder wall thickening is nonspecific and could indicate acalculous cholecystitis, lymphoma, or may be related to liver disease, heart disease, or hypoproteinemia. Common bile duct: Diameter: 3 mm, normal Liver: No focal lesion identified. Within normal limits in parenchymal echogenicity. The mass like structure seen inferior to the liver is not identified specifically sonographically. Small amount of free fluid demonstrated around the liver. Portal vein is patent on color Doppler imaging with normal direction of blood flow towards the liver. IMPRESSION: 1. Prominent diffuse thickening of the gallbladder wall with edema and gallbladder sludge. No stones identified. This appearance is nonspecific in could indicate acalculous cholecystitis, lymphoma, or changes due to liver disease, heart disease, or hypoproteinemia. 2. Small amount of free fluid around the liver. 3. Mass adjacent to the liver seen at CT is not identified sonographically for correlation. Electronically Signed   By: Lucienne Capers M.D.   On: 04/05/2018 02:37     Assessment and plan- Patient is a 37 y.o. 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 Patient completed R Hyper CVAD in jan 2019. He now has peritoneal recurrence and prostatomegaly. Needs repeat biopsy and further treatment at [facility]. He is awaiting transfer.  2. Kidney functions improved after nephrostomy tube placement but not back to baseline. Continue fluids at 100 cc/hr to prevent spontaneous TLS. No hyperkalemia or high uric noted today. He is s/p rasburicase. Monitor daily phos, K and uric acid. LDH from today is pending  3. Continue symptomatic management at this time. If he  does not get a bed in time and  gets more symptomatic, we may have to give him steroids at that point. I would hold off on that for now.I spoke to Dr. Pershing Proud from [facility] who is the inpatient lymphoma attending and she will look into the bed situation and transfer as well from her side  Will continue to follow   Visit Diagnosis 1. Lymphoma of intra-abdominal lymph nodes, unspecified lymphoma type ([place])   2. Acute renal failure, unspecified acute renal failure type ([place])   3. Enlarged prostate   4. Abdominal mass, RUQ (right upper quadrant)   5. Thickening of wall of gallbladder   6. Generalized abdominal pain   7. At high risk of tumor lysis syndrome   8. Hydronephrosis   9. Abdominal distension      Dr. Randa Evens, MD, MPH [facility] [place] [phone] 04/11/2018 3:27 PM

## 2018-04-12 LAB — BASIC METABOLIC PANEL
Anion gap: 9 (ref 5–15)
BUN: 11 mg/dL (ref 6–20)
CHLORIDE: 109 mmol/L (ref 98–111)
CO2: 20 mmol/L — ABNORMAL LOW (ref 22–32)
Calcium: 8.1 mg/dL — ABNORMAL LOW (ref 8.9–10.3)
Creatinine, Ser: 1.22 mg/dL (ref 0.61–1.24)
GFR calc non Af Amer: >60 mL/min (ref >60)
Glucose, Bld: 90 mg/dL (ref 70–99)
POTASSIUM: 3.6 mmol/L (ref 3.5–5.1)
Sodium: 138 mmol/L (ref 135–145)

## 2018-04-12 LAB — CBC
HCT: 28.4 % — ABNORMAL LOW (ref 40.0–52.0)
HEMOGLOBIN: 10.2 g/dL — AB (ref 13.0–18.0)
MCH: 33.6 pg (ref 26.0–34.0)
MCHC: 35.8 g/dL (ref 32.0–36.0)
MCV: 93.9 fL (ref 80.0–100.0)
Platelets: 162 10*3/uL (ref 150–440)
RBC: 3.03 MIL/uL — AB (ref 4.40–5.90)
RDW: 12.6 % (ref 11.5–14.5)
WBC: 2.1 10*3/uL — AB (ref 3.8–10.6)

## 2018-04-12 LAB — PHOSPHORUS: Phosphorus: 2.9 mg/dL (ref 2.5–4.6)

## 2018-04-12 LAB — URIC ACID: URIC ACID, SERUM: 3.8 mg/dL (ref 3.7–8.6)

## 2018-04-12 LAB — MAGNESIUM: MAGNESIUM: 1.2 mg/dL — AB (ref 1.7–2.4)

## 2018-04-12 MED ORDER — ALFUZOSIN HCL ER 10 MG PO TB24: 10.0000 mg | ORAL_TABLET | Freq: Every day | ORAL | Status: AC

## 2018-04-12 MED ORDER — MAGNESIUM SULFATE 4 GM/100ML IV SOLN
4.0000 g | Freq: Once | INTRAVENOUS | Status: AC
  Administered 2018-04-12: 4 g via INTRAVENOUS
  Filled 2018-04-12: qty 100

## 2018-04-12 MED ORDER — AMLODIPINE BESYLATE 10 MG PO TABS: 10.0000 mg | ORAL_TABLET | Freq: Every day | ORAL | Status: AC

## 2018-04-12 MED ORDER — FUROSEMIDE 40 MG PO TABS
40.0000 mg | ORAL_TABLET | Freq: Once | ORAL | Status: AC
  Administered 2018-04-12: 40 mg via ORAL
  Filled 2018-04-12: qty 1

## 2018-04-12 MED ORDER — ALLOPURINOL 100 MG PO TABS: 100.0000 mg | ORAL_TABLET | Freq: Three times a day (TID) | ORAL | Status: AC

## 2018-04-12 MED ORDER — AMLODIPINE BESYLATE 10 MG PO TABS
10.0000 mg | ORAL_TABLET | Freq: Every day | ORAL | Status: DC
  Administered 2018-04-12: 10 mg via ORAL
  Filled 2018-04-12: qty 1

## 2018-04-12 NOTE — Discharge Summary (Addendum)
[facility] [place] NAME: Mark Burns    MR#:  [id number]  DATE OF BIRTH:  April 12, 1981  DATE OF ADMISSION:  04/05/2018   ADMITTING PHYSICIAN: Arta Silence, MD  DATE OF DISCHARGE: 04/12/2018  PRIMARY CARE PHYSICIAN: Venia Carbon, MD   ADMISSION DIAGNOSIS:  Generalized abdominal pain [R10.84] Enlarged prostate [N40.0] Abdominal mass, RUQ (right upper quadrant) [R19.01] Acute renal failure, unspecified acute renal failure type (HCC) [N17.9] Thickening of wall of gallbladder [K82.8] Lymphoma of intra-abdominal lymph nodes, unspecified lymphoma type ([place]) [C85.93] DISCHARGE DIAGNOSIS:  Active Problems:   Acute kidney injury ([place])   Lymphoma of intra-abdominal lymph nodes (HCC)   Acute renal failure (HCC)   Enlarged prostate   Abdominal mass, RUQ (right upper quadrant)   Generalized abdominal pain   At high risk of tumor lysis syndrome   Hyperuricemia  SECONDARY DIAGNOSIS:   Past Medical History:  Diagnosis Date  . ADHD, predominantly inattentive type   . Diffuse large B cell lymphoma ([place])   . Elevated LFTs 11/19/2013  . Erectile dysfunction   . Generalized anxiety disorder   . Gout   . Hypertension   . Recurrent cold sores   . Seborrheic dermatitis    HOSPITAL COURSE:  #Acute kidney injury-postrenal-with left-sided mild hydronephrosis and hydroureter probably from  prostate enlargement IV fluids, avoid nephrotoxins, Foley catheter Much improved.   * Left hydronephrosis:  Renal US Today shows Mild bilateral hydronephrosis somewhat more prominent on the right when compared with the prior CT. The left hydronephrosis is stable S/p b/l nephrostomy. Bloody drainage but no urine out from left tube. Urine from right tube.  Per radiologist, no need to change.  #Clinical lymphoma recurrence -Significant elevated LDH.abnormal CT findings concerning for lymphoma recurrence. Dr Janese Banks recommends transfer to [facility]  Ascites, possible due to lymphoma.  S/P US guided paracentesis, yielding3.6L of serous ascitic fluid.  #Hyperuricemia, patient isathigh risk of tumor lysis syndrome. Continue allopurinol He was on aggressive IV hydration. - Uric acid has normalized - Dr.Beavenrequests that at dischargepatient to bring CD with a copy of his CT image which [facility].   #Hypokalemia and hypomagnesemia Repleted and improved. Mg 1.2 today, IV mag.   #transaminitis Improved.  #History of gout no flareups at this time  HTN. Increased norvasc to 10 mg daily. Discontinued NS IV.  DVT prophylaxis with heparin subcu  Bed is available, transfer to [facility] today. DISCHARGE CONDITIONS:  Guarded, transferred to [facility] hospital today. CONSULTS OBTAINED:  Treatment Team:  Arta Silence, MD Twana First, MD Cleon Gustin, MD Sindy Guadeloupe, MD DRUG ALLERGIES:   Allergies  Allergen Reactions  . Sulfa Antibiotics Hives   DISCHARGE MEDICATIONS:   Allergies as of 04/12/2018      Reactions   Sulfa Antibiotics Hives      Medication List    STOP taking these medications   HYDROcodone-acetaminophen 5-325 MG tablet Commonly known as:  NORCO/VICODIN   losartan 100 MG tablet Commonly known as:  COZAAR   sildenafil 20 MG tablet Commonly known as:  REVATIO     TAKE these medications   alfuzosin 10 MG 24 hr tablet Commonly known as:  UROXATRAL Take 1 tablet (10 mg total) by mouth daily with breakfast. Start taking on:  04/13/2018   allopurinol 100 MG tablet Commonly known as:  ZYLOPRIM Take 1 tablet (100 mg total) by mouth 3 (three) times daily.   amLODipine 10 MG tablet Commonly  known as:  NORVASC Take 1 tablet (10 mg total) by mouth daily.   DULoxetine 30 MG capsule Commonly known as:  CYMBALTA TAKE 3 CAPSULES BY MOUTH EVERY DAY   LORazepam 1 MG tablet Commonly known as:  ATIVAN Take 0.5-1 tablets (0.5-1 mg total) by mouth at bedtime as  needed. for anxiety        DISCHARGE INSTRUCTIONS:  See AVS.   If you experience worsening of your admission symptoms, develop shortness of breath, life threatening emergency, suicidal or homicidal thoughts you must seek medical attention immediately by calling 911 or calling your MD immediately  if symptoms less severe.  You Must read complete instructions/literature along with all the possible adverse reactions/side effects for all the Medicines you take and that have been prescribed to you. Take any new Medicines after you have completely understood and accpet all the possible adverse reactions/side effects.   Please note  You were cared for by a hospitalist during your hospital stay. If you have any questions about your discharge medications or the care you received while you were in the hospital after you are discharged, you can call the unit and asked to speak with the hospitalist on call if the hospitalist that took care of you is not available. Once you are discharged, your primary care physician will handle any further medical issues. Please note that NO REFILLS for any discharge medications will be authorized once you are discharged, as it is imperative that you return to your primary care physician (or establish a relationship with a primary care physician if you do not have one) for your aftercare needs so that they can reassess your need for medications and monitor your lab values.    On the day of Discharge:  VITAL SIGNS:  Blood pressure (!) 148/96, pulse 91, temperature 98.4 F (36.9 C), temperature source Oral, resp. rate 20, height 6\' 2"  (1.88 m), weight 251 lb (113.9 kg), SpO2 96 %. PHYSICAL EXAMINATION:  GENERAL:  37 y.o.-year-old patient lying in the bed with no acute distress.  Obesity. EYES: Pupils equal, round, reactive to light and accommodation. No scleral icterus. Extraocular muscles intact.  HEENT: Head atraumatic, normocephalic. Oropharynx and nasopharynx clear.    NECK:  Supple, no jugular venous distention. No thyroid enlargement, no tenderness.  LUNGS: Normal breath sounds bilaterally, no wheezing, rales,rhonchi or crepitation. No use of accessory muscles of respiration.  CARDIOVASCULAR: S1, S2 normal. No murmurs, rubs, or gallops.  ABDOMEN: Soft, non-tender, distended. Bowel sounds present. No organomegaly or mass.  Bilateral nephrostomy tube in situ. EXTREMITIES: No pedal edema, cyanosis, or clubbing.  NEUROLOGIC: Cranial nerves II through XII are intact. Muscle strength 5/5 in all extremities. Sensation intact. Gait not checked.  PSYCHIATRIC: The patient is alert and oriented x 3.  SKIN: No obvious rash, lesion, or ulcer.  DATA REVIEW:   CBC Recent Labs  Lab 04/12/18 0546  WBC 2.1*  HGB 10.2*  HCT 28.4*  PLT 162    Chemistries  Recent Labs  Lab 04/09/18 0655  04/12/18 0546  NA 138   < > 138  K 3.9   < > 3.6  CL 110   < > 109  CO2 20*   < > 20*  GLUCOSE 77   < > 90  BUN 18   < > 11  CREATININE 2.59*   < > 1.22  CALCIUM 8.3*   < > 8.1*  MG  --   --  1.2*  AST 36  --   --  ALT 24  --   --   ALKPHOS 69  --   --   BILITOT 0.8  --   --    < > = values in this interval not displayed.     Microbiology Results  Results for orders placed or performed during the hospital encounter of 12/26/17  Blood Culture (routine x 2)     Status: None   Collection Time: 12/26/17  9:02 PM  Result Value Ref Range Status   Specimen Description BLOOD BLOOD LEFT ARM  Final   Special Requests   Final    BOTTLES DRAWN AEROBIC AND ANAEROBIC Blood Culture results may not be optimal due to an excessive volume of blood received in culture bottles   Culture   Final    NO GROWTH 5 DAYS Performed at [facility], [address], [place], [place] [address]    Report Status 12/31/2017 FINAL  Final  Blood Culture (routine x 2)     Status: None   Collection Time: 12/26/17  9:02 PM  Result Value Ref Range Status   Specimen Description BLOOD  BLOOD RIGHT ARM  Final   Special Requests   Final    BOTTLES DRAWN AEROBIC AND ANAEROBIC Blood Culture adequate volume   Culture   Final    NO GROWTH 5 DAYS Performed at [facility], [address] [place], [place] [address]    Report Status 12/31/2017 FINAL  Final  Urine culture     Status: Abnormal   Collection Time: 12/26/17  9:02 PM  Result Value Ref Range Status   Specimen Description   Final    URINE, RANDOM Performed at [facility], [address], [place], [place] [address]    Special Requests   Final    NONE Performed at [facility], [address] [place], [place] [address]    Culture (A)  Final    <10,000 COLONIES/mL INSIGNIFICANT GROWTH Performed at [facility], [place] [address] [place], [place] [address]    Report Status 12/28/2017 FINAL  Final  Culture, sputum-assessment     Status: None   Collection Time: 12/27/17 10:56 AM  Result Value Ref Range Status   Specimen Description Expect. Sput  Final   Special Requests NONE  Final   Sputum evaluation   Final    THIS SPECIMEN IS ACCEPTABLE FOR SPUTUM CULTURE Performed at [facility], [address] [place], [place] [address]    Report Status 12/27/2017 FINAL  Final  Culture, respiratory (NON-Expectorated)     Status: None   Collection Time: 12/27/17 10:56 AM  Result Value Ref Range Status   Specimen Description   Final    Expect. Sput Performed at [facility], [address] [place], [place] [address]    Special Requests   Final    NONE Reflexed from [id number] Performed at [facility], [place], [place] [address]    Gram Stain   Final    MODERATE WBC PRESENT,BOTH PMN AND MONONUCLEAR RARE SQUAMOUS EPITHELIAL CELLS PRESENT RARE GRAM POSITIVE COCCI IN PAIRS    Culture   Final    FEW Consistent with normal respiratory flora. Performed at [facility], [place] [address], [place], [place] [address]    Report Status  12/30/2017 FINAL  Final    RADIOLOGY:  US Paracentesis  Result Date: 04/11/2018 INDICATION: Recurrent lymphoma, now with symptomatic intra-abdominal ascites. Please perform ultrasound-guided paracentesis for diagnostic and therapeutic purposes.  EXAM: ULTRASOUND-GUIDED PARACENTESIS COMPARISON:  CT abdomen and pelvis - 04/08/2018 MEDICATIONS: None. COMPLICATIONS: None immediate. TECHNIQUE: Informed written consent was obtained from the patient after a discussion of the risks, benefits and alternatives to treatment. A timeout was performed prior to the initiation of the procedure. Initial ultrasound scanning demonstrates a moderate amount of ascites within the left lower abdominal quadrant. The right lower abdomen was prepped and draped in the usual sterile fashion. 1% lidocaine with epinephrine was used for local anesthesia. An ultrasound image was saved for documentation purposed. An 8 Fr Safe-T-Centesis catheter was introduced. The paracentesis was performed. The catheter was removed and a dressing was applied. The patient tolerated the procedure well without immediate post procedural complication. FINDINGS: A total of approximately 3.6 liters of serous fluid was removed. Samples were sent to the laboratory as requested by the clinical team. IMPRESSION: Successful ultrasound-guided paracentesis yielding 3.6 liters of peritoneal fluid. Electronically Signed   By: Sandi Mariscal M.D.   On: 04/11/2018 15:12   Ir Nephrostomy Placement Left  Result Date: 04/11/2018 INDICATION: History of limbs biloma with bilateral obstructive uropathy post placement bilateral percutaneous nephrostomy catheters Right-sided nephrostomy catheters been function well however there has been limited predominately bloody output from the left-sided percutaneous nephrostomy catheter. As such, request made for antegrade nephrostogram and potential exchange EXAM: ANTEGRADE LEFT-SIDED NEPHROSTOGRAM. COMPARISON:  Ultrasound and fluoroscopic  guided placement of bilateral percutaneous nephrostomy catheters - 04/09/2018 CONTRAST:  10 mL Isovue-300 administered into the left renal collecting system FLUOROSCOPY TIME:  42 seconds (53 mGy) COMPLICATIONS: None immediate. TECHNIQUE: Informed written consent was obtained from the patient after a discussion of the risks, benefits and alternatives to treatment. Questions regarding the procedure were encouraged and answered. A timeout was performed prior to the initiation of the procedure. Patient was positioned prone on the fluoroscopy table. Preprocedural spot fluoroscopic image was obtained of the left flank and existing percutaneous nephrostomy catheter. Multiple spot fluoroscopic images were obtained from the injection of small amount of contrast via the existing left-sided nephrostomy catheter Images were reviewed and the procedure was terminated. The catheter was flushed with a small amount of saline and connected to a gravity bag. Patient tolerated the procedure well immediate postprocedural complication. FINDINGS: Preprocedural spot fluoroscopic image demonstrates a left-sided percutaneous nephrostomy catheter overlying the expected location of left renal fossa. Contrast injection demonstrates appropriate position functionality of the left-sided nephrostomy catheter. There is a minimal amount of nonocclusive clot within the left renal collecting system. No evidence of left-sided pelvicaliectasis. There is brisk passage of contrast to the left ureter to the level of the urinary bladder. IMPRESSION: Appropriately positioned and functioning left-sided percutaneous nephrostomy catheter. No exchange performed. While there is a small amount nonocclusive clot seen within the left renal collecting system, there is no evidence of left-sided pelvicaliectasis and additionally there is brisk passage of contrast through the left ureter to the level of the urinary bladder. Note, given lack of left-sided urinary  obstruction, output from the left-sided percutaneous nephrostomy catheter may be variable as some urine will drain to the left-sided nephrostomy while some will drain through the ureter to the urinary bladder and Foley catheter. Electronically Signed   By: Sandi Mariscal M.D.   On: 04/11/2018 15:09     Management plans discussed with the patient, family and they are in agreement.  CODE STATUS: Full Code   TOTAL TIME TAKING CARE OF THIS PATIENT: 33 minutes.    Demetrios Loll M.D on 04/12/2018 at 1:28 PM  Between  7am to 6pm - Pager - [phone]  After 6pm go to [email or web address] - Proofreader  [employer] [facility]  Office  [phone]  CC: Primary care physician; Venia Carbon, MD   Note: This dictation was prepared with Dragon dictation along with smaller phrase technology. Any transcriptional errors that result from this process are unintentional.

## 2018-04-12 NOTE — Progress Notes (Addendum)
[facility] NAME: Mark Burns    MR#:  [id number]  DATE OF BIRTH:  13-Aug-1981  SUBJECTIVE:  CHIEF COMPLAINT:   Betterbdominal discomfort and distension.  S/p paracentesis with 3.5 L fluid. REVIEW OF SYSTEMS:  CONSTITUTIONAL: No fever, fatigue or weakness.  EYES: No blurred or double vision.  EARS, NOSE, AND THROAT: No tinnitus or ear pain.  RESPIRATORY: No cough, shortness of breath, wheezing or hemoptysis.  CARDIOVASCULAR: No chest pain, orthopnea, edema.  GASTROINTESTINAL: No nausea, vomiting, diarrhea, but abdominal pain sore.  GENITOURINARY: No dysuria, hematuria.  ENDOCRINE: No polyuria, nocturia,  HEMATOLOGY: No anemia, easy bruising or bleeding SKIN: No rash or lesion. MUSCULOSKELETAL: No joint pain or arthritis.  Leg edema. NEUROLOGIC: No tingling, numbness, weakness.  PSYCHIATRY: No anxiety or depression.   DRUG ALLERGIES:   Allergies  Allergen Reactions  . Sulfa Antibiotics Hives    VITALS:  Blood pressure (!) 148/96, pulse 91, temperature 98.4 F (36.9 C), temperature source Oral, resp. rate 20, height 6\' 2"  (1.88 m), weight 251 lb (113.9 kg), SpO2 96 %.  PHYSICAL EXAMINATION:  GENERAL:  37 y.o.-year-old patient lying in the bed with no acute distress.  Obesity. EYES: Pupils equal, round, reactive to light and accommodation. No scleral icterus. Extraocular muscles intact.  HEENT: Head atraumatic, normocephalic. Oropharynx and nasopharynx clear.  NECK:  Supple, no jugular venous distention. No thyroid enlargement, no tenderness.  LUNGS: Normal breath sounds bilaterally, no wheezing, rales,rhonchi or crepitation. No use of accessory muscles of respiration.  CARDIOVASCULAR: S1, S2 normal. No murmurs, rubs, or gallops.  ABDOMEN: Soft, nontender, distended, + ascites sign. Bowel sounds present. No organomegaly or mass. S/p b/l nephrostomy with bloody drainage. EXTREMITIES: No cyanosis, or clubbing. B/l leg  trace edema, NEUROLOGIC: Cranial nerves II through XII are intact. Muscle strength 5/5 in all extremities. Sensation intact. Gait not checked.  PSYCHIATRIC: The patient is alert and oriented x 3.  SKIN: No obvious rash, lesion, or ulcer.  LABORATORY PANEL:   CBC Recent Labs  Lab 04/12/18 0546  WBC 2.1*  HGB 10.2*  HCT 28.4*  PLT 162   ------------------------------------------------------------------------------------------------------------------  Chemistries  Recent Labs  Lab 04/09/18 0655  04/12/18 0546  NA 138   < > 138  K 3.9   < > 3.6  CL 110   < > 109  CO2 20*   < > 20*  GLUCOSE 77   < > 90  BUN 18   < > 11  CREATININE 2.59*   < > 1.22  CALCIUM 8.3*   < > 8.1*  MG  --   --  1.2*  AST 36  --   --   ALT 24  --   --   ALKPHOS 69  --   --   BILITOT 0.8  --   --    < > = values in this interval not displayed.   ------------------------------------------------------------------------------------------------------------------  Cardiac Enzymes No results for input(s): TROPONINI in the last 168 hours. ------------------------------------------------------------------------------------------------------------------  RADIOLOGY:  US Paracentesis  Result Date: 04/11/2018 INDICATION: Recurrent lymphoma, now with symptomatic intra-abdominal ascites. Please perform ultrasound-guided paracentesis for diagnostic and therapeutic purposes. EXAM: ULTRASOUND-GUIDED PARACENTESIS COMPARISON:  CT abdomen and pelvis - 04/08/2018 MEDICATIONS: None. COMPLICATIONS: None immediate. TECHNIQUE: Informed written consent was obtained from the patient after a discussion of the risks, benefits and alternatives to treatment. A timeout was performed prior to the initiation of the procedure. Initial ultrasound scanning demonstrates a moderate amount  of ascites within the left lower abdominal quadrant. The right lower abdomen was prepped and draped in the usual sterile fashion. 1% lidocaine with  epinephrine was used for local anesthesia. An ultrasound image was saved for documentation purposed. An 8 Fr Safe-T-Centesis catheter was introduced. The paracentesis was performed. The catheter was removed and a dressing was applied. The patient tolerated the procedure well without immediate post procedural complication. FINDINGS: A total of approximately 3.6 liters of serous fluid was removed. Samples were sent to the laboratory as requested by the clinical team. IMPRESSION: Successful ultrasound-guided paracentesis yielding 3.6 liters of peritoneal fluid. Electronically Signed   By: Sandi Mariscal M.D.   On: 04/11/2018 15:12   Ir Nephrostomy Placement Left  Result Date: 04/11/2018 INDICATION: History of limbs biloma with bilateral obstructive uropathy post placement bilateral percutaneous nephrostomy catheters Right-sided nephrostomy catheters been function well however there has been limited predominately bloody output from the left-sided percutaneous nephrostomy catheter. As such, request made for antegrade nephrostogram and potential exchange EXAM: ANTEGRADE LEFT-SIDED NEPHROSTOGRAM. COMPARISON:  Ultrasound and fluoroscopic guided placement of bilateral percutaneous nephrostomy catheters - 04/09/2018 CONTRAST:  10 mL Isovue-300 administered into the left renal collecting system FLUOROSCOPY TIME:  42 seconds (53 mGy) COMPLICATIONS: None immediate. TECHNIQUE: Informed written consent was obtained from the patient after a discussion of the risks, benefits and alternatives to treatment. Questions regarding the procedure were encouraged and answered. A timeout was performed prior to the initiation of the procedure. Patient was positioned prone on the fluoroscopy table. Preprocedural spot fluoroscopic image was obtained of the left flank and existing percutaneous nephrostomy catheter. Multiple spot fluoroscopic images were obtained from the injection of small amount of contrast via the existing left-sided  nephrostomy catheter Images were reviewed and the procedure was terminated. The catheter was flushed with a small amount of saline and connected to a gravity bag. Patient tolerated the procedure well immediate postprocedural complication. FINDINGS: Preprocedural spot fluoroscopic image demonstrates a left-sided percutaneous nephrostomy catheter overlying the expected location of left renal fossa. Contrast injection demonstrates appropriate position functionality of the left-sided nephrostomy catheter. There is a minimal amount of nonocclusive clot within the left renal collecting system. No evidence of left-sided pelvicaliectasis. There is brisk passage of contrast to the left ureter to the level of the urinary bladder. IMPRESSION: Appropriately positioned and functioning left-sided percutaneous nephrostomy catheter. No exchange performed. While there is a small amount nonocclusive clot seen within the left renal collecting system, there is no evidence of left-sided pelvicaliectasis and additionally there is brisk passage of contrast through the left ureter to the level of the urinary bladder. Note, given lack of left-sided urinary obstruction, output from the left-sided percutaneous nephrostomy catheter may be variable as some urine will drain to the left-sided nephrostomy while some will drain through the ureter to the urinary bladder and Foley catheter. Electronically Signed   By: Sandi Mariscal M.D.   On: 04/11/2018 15:09    EKG:   Orders placed or performed during the hospital encounter of 12/26/17  . ED EKG 12-Lead  . ED EKG 12-Lead  . EKG 12-Lead  . EKG 12-Lead  . EKG    ASSESSMENT AND PLAN:   #Acute kidney injury-postrenal-with left-sided mild hydronephrosis and hydroureter probably from  prostate enlargement IV fluids, avoid nephrotoxins, Foley catheter Much improved.   * Left hydronephrosis:  Renal US Today shows Mild bilateral hydronephrosis somewhat more prominent on the right when  compared with the prior CT. The left hydronephrosis is stable S/p b/l  nephrostomy. Bloody drainage but no urine out from left tube. Urine from right tube.  Per radiologist, no need to change.  #Clinical lymphoma recurrence - Significant elevated LDH.  abnormal CT findings concerning for lymphoma recurrence. Dr Janese Banks recommends transfer to [facility] under Dr.Beaven  Ascites, possible due to lymphoma.  S/P US guided paracentesis, yielding 3.6 L of serous ascitic fluid.  #Hyperuricemia, patient is at high risk of tumor lysis syndrome. Continue allopurinol  He was on aggressive IV hydration. - Uric acid has normalized - Dr.Beaven requests that at discharge patient to bring CD with a copy of his CT image which may expedite his care at [facility].   #Hypokalemia and hypomagnesemia Repleted and improved. Mg 1.2 today, IV mag.   #transaminitis Improved.  #History of gout no flareups at this time  HTN. Increased norvasc to 10 mg daily. Discontinued NS IV.  DVT prophylaxis with heparin subcu  Possible transfer to [facility] today per Dr. Janese Banks.  All the records are reviewed and case discussed with Care Management/Social Workerr. Management plans discussed with the patient, Dr. Vernard Gambles, radiologist, and they are in agreement.  CODE STATUS: FULL CODE  TOTAL TIME TAKING CARE OF THIS PATIENT: 32 minutes.   POSSIBLE D/C IN 2  DAYS, DEPENDING ON CLINICAL CONDITION. Urology and onco eval  Note: This dictation was prepared with Dragon dictation along with smaller phrase technology. Any transcriptional errors that result from this process are unintentional.   Demetrios Loll M.D on 04/12/2018 at 1:22 PM  Between 7am to 6pm - Pager - [phone] After 6pm go to [email or web address] - password [email or web address] [facility]  Office  [phone]  CC: Primary care physician; Venia Carbon, MD

## 2018-04-12 NOTE — Progress Notes (Signed)
[facility] arrived to pick up patient and transport to [facility].

## 2018-04-12 NOTE — Progress Notes (Signed)
Report called and given to [place] at [facility]. [facility] called for transport. [facility] stated this nurse needed to call [employer]. [facility] called and will call back with a time they are able to transport pt.

## 2018-04-13 MED ORDER — HEPARIN SODIUM (PORCINE) 5000 UNIT/ML IJ SOLN
5000.00 | INTRAMUSCULAR | Status: DC
Start: 2018-04-20 — End: 2018-04-13

## 2018-04-13 MED ORDER — ACETAMINOPHEN 325 MG PO TABS
650.00 | ORAL_TABLET | ORAL | Status: DC
Start: ? — End: 2018-04-13

## 2018-04-13 MED ORDER — ALLOPURINOL 100 MG PO TABS
100.00 | ORAL_TABLET | ORAL | Status: DC
Start: 2018-04-14 — End: 2018-04-13

## 2018-04-13 MED ORDER — AMLODIPINE BESYLATE 10 MG PO TABS
10.00 | ORAL_TABLET | ORAL | Status: DC
Start: 2018-04-21 — End: 2018-04-13

## 2018-04-13 MED ORDER — ALPRAZOLAM 0.5 MG PO TABS
0.50 | ORAL_TABLET | ORAL | Status: DC
Start: ? — End: 2018-04-13

## 2018-04-13 MED ORDER — GENERIC EXTERNAL MEDICATION
Status: DC
Start: ? — End: 2018-04-13

## 2018-04-13 MED ORDER — POLYETHYLENE GLYCOL 3350 17 G PO PACK
17.00 | PACK | ORAL | Status: DC
Start: ? — End: 2018-04-13

## 2018-04-13 MED ORDER — BUTALBITAL-APAP-CAFFEINE 50-325-40 MG PO TABS
1.00 | ORAL_TABLET | ORAL | Status: DC
Start: ? — End: 2018-04-13

## 2018-04-13 MED ORDER — GENERIC EXTERNAL MEDICATION
2.00 | Status: DC
Start: ? — End: 2018-04-13

## 2018-04-13 MED ORDER — DULOXETINE HCL 60 MG PO CPEP
60.00 | ORAL_CAPSULE | ORAL | Status: DC
Start: 2018-04-21 — End: 2018-04-13

## 2018-04-13 MED ORDER — OXYBUTYNIN CHLORIDE 5 MG PO TABS
5.00 | ORAL_TABLET | ORAL | Status: DC
Start: ? — End: 2018-04-13

## 2018-04-13 MED ORDER — TAMSULOSIN HCL 0.4 MG PO CAPS
.40 | ORAL_CAPSULE | ORAL | Status: DC
Start: 2018-04-21 — End: 2018-04-13

## 2018-04-14 MED ORDER — GENERIC EXTERNAL MEDICATION
Status: DC
Start: ? — End: 2018-04-14

## 2018-04-14 MED ORDER — SODIUM CHLORIDE 0.9 % IV SOLN
100.00 | INTRAVENOUS | Status: DC
Start: ? — End: 2018-04-14

## 2018-04-15 MED ORDER — GENERIC EXTERNAL MEDICATION
Status: DC
Start: ? — End: 2018-04-15

## 2018-04-15 MED ORDER — POLYETHYLENE GLYCOL 3350 17 G PO PACK
17.00 | PACK | ORAL | Status: DC
Start: 2018-04-21 — End: 2018-04-15

## 2018-04-15 MED ORDER — ALLOPURINOL 300 MG PO TABS
300.00 | ORAL_TABLET | ORAL | Status: DC
Start: 2018-04-15 — End: 2018-04-15

## 2018-04-15 MED ORDER — GENERIC EXTERNAL MEDICATION
2.00 | Status: DC
Start: 2018-04-20 — End: 2018-04-15

## 2018-04-15 MED ORDER — SODIUM CHLORIDE 0.9 % IV SOLN
100.00 | INTRAVENOUS | Status: DC
Start: ? — End: 2018-04-15

## 2018-04-15 MED ORDER — THIAMINE HCL 100 MG PO TABS
100.00 | ORAL_TABLET | ORAL | Status: DC
Start: 2018-04-15 — End: 2018-04-15

## 2018-04-20 MED ORDER — ALLOPURINOL 300 MG PO TABS
300.00 | ORAL_TABLET | ORAL | Status: DC
Start: 2018-04-20 — End: 2018-04-20

## 2018-04-20 MED ORDER — TBO-FILGRASTIM 480 MCG/0.8ML ~~LOC~~ SOSY
480.00 | PREFILLED_SYRINGE | SUBCUTANEOUS | Status: DC
Start: 2018-04-21 — End: 2018-04-20

## 2018-04-20 MED ORDER — SODIUM CHLORIDE 0.9 % IV SOLN
20.00 | INTRAVENOUS | Status: DC
Start: ? — End: 2018-04-20

## 2018-04-20 MED ORDER — EPINEPHRINE 0.3 MG/0.3ML IJ SOAJ
0.30 | INTRAMUSCULAR | Status: DC
Start: ? — End: 2018-04-20

## 2018-04-20 MED ORDER — GENERIC EXTERNAL MEDICATION
Status: DC
Start: ? — End: 2018-04-20

## 2018-04-20 MED ORDER — LABETALOL HCL 5 MG/ML IV SOLN
10.00 | INTRAVENOUS | Status: DC
Start: ? — End: 2018-04-20

## 2018-04-20 MED ORDER — FAMOTIDINE 20 MG/2ML IV SOLN
20.00 | INTRAVENOUS | Status: DC
Start: ? — End: 2018-04-20

## 2018-04-20 MED ORDER — THIAMINE HCL 100 MG PO TABS
100.00 | ORAL_TABLET | ORAL | Status: DC
Start: 2018-04-20 — End: 2018-04-20

## 2018-04-20 MED ORDER — DRONABINOL 5 MG PO CAPS
5.00 | ORAL_CAPSULE | ORAL | Status: DC
Start: ? — End: 2018-04-20

## 2018-04-20 MED ORDER — MEPERIDINE HCL 25 MG/ML IJ SOLN
25.00 | INTRAMUSCULAR | Status: DC
Start: ? — End: 2018-04-20

## 2018-04-20 MED ORDER — DIPHENHYDRAMINE HCL 50 MG/ML IJ SOLN
25.00 | INTRAMUSCULAR | Status: DC
Start: ? — End: 2018-04-20

## 2018-04-20 MED ORDER — SODIUM CHLORIDE 0.9 % IV SOLN
1000.00 | INTRAVENOUS | Status: DC
Start: ? — End: 2018-04-20

## 2018-04-20 MED ORDER — OXYBUTYNIN CHLORIDE 5 MG PO TABS
5.00 | ORAL_TABLET | ORAL | Status: DC
Start: 2018-04-20 — End: 2018-04-20

## 2018-04-20 MED ORDER — ACETAMINOPHEN 325 MG PO TABS
650.00 | ORAL_TABLET | ORAL | Status: DC
Start: ? — End: 2018-04-20

## 2018-04-20 MED ORDER — GENERIC EXTERNAL MEDICATION
10.00 | Status: DC
Start: ? — End: 2018-04-20

## 2018-04-20 MED ORDER — METHYLPREDNISOLONE SODIUM SUCC 125 MG IJ SOLR
125.00 | INTRAMUSCULAR | Status: DC
Start: ? — End: 2018-04-20

## 2018-04-20 MED ORDER — SEVELAMER CARBONATE 800 MG PO TABS
800.00 | ORAL_TABLET | ORAL | Status: DC
Start: 2018-04-21 — End: 2018-04-20

## 2018-04-20 MED ORDER — PROCHLORPERAZINE MALEATE 5 MG PO TABS
10.00 | ORAL_TABLET | ORAL | Status: DC
Start: ? — End: 2018-04-20

## 2018-04-21 ENCOUNTER — Encounter: Payer: Self-pay | Admitting: Internal Medicine

## 2018-04-21 DIAGNOSIS — N139 Obstructive and reflux uropathy, unspecified: Secondary | ICD-10-CM

## 2018-04-23 ENCOUNTER — Ambulatory Visit: Payer: BC Managed Care – PPO | Admitting: Urology

## 2018-04-23 ENCOUNTER — Encounter: Payer: Self-pay | Admitting: Urology

## 2018-04-23 ENCOUNTER — Other Ambulatory Visit: Payer: Self-pay | Admitting: Radiology

## 2018-04-23 VITALS — BP 130/96 | HR 118 | Ht 74.0 in | Wt 248.8 lb

## 2018-04-23 DIAGNOSIS — N139 Obstructive and reflux uropathy, unspecified: Secondary | ICD-10-CM | POA: Diagnosis not present

## 2018-04-23 LAB — MICROSCOPIC EXAMINATION
EPITHELIAL CELLS (NON RENAL): NONE SEEN /HPF (ref 0–10)
WBC UA: NONE SEEN /HPF (ref 0–5)

## 2018-04-23 LAB — URINALYSIS, COMPLETE
BILIRUBIN UA: NEGATIVE
GLUCOSE, UA: NEGATIVE
KETONES UA: NEGATIVE
LEUKOCYTES UA: NEGATIVE
Nitrite, UA: NEGATIVE
SPEC GRAV UA: 1.015 (ref 1.005–1.030)
Urobilinogen, Ur: 0.2 mg/dL (ref 0.2–1.0)
pH, UA: 5.5 (ref 5.0–7.5)

## 2018-04-23 NOTE — Progress Notes (Signed)
04/23/2018 2:20 PM   Koleen Distance May 12, 1981 [id number]  Referring provider: Venia Carbon, MD [address] [place], [place] [address]  Chief Complaint  Patient presents with  . 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  The prostate was estimated at 200 g volume on CT.  He had progressive hydronephrosis and had bilateral nephrostomy tubes placed by interventional radiology.  He was transferred to [facility] and is currently undergoing chemotherapy.  He presents today requesting that his nephrostomy tubes be removed ASAP.  He states his voiding pattern is back to baseline.   PMH: Past Medical History:  Diagnosis Date  . ADHD, predominantly inattentive type   . Diffuse large B cell lymphoma ([place])   . Elevated LFTs 11/19/2013  . Erectile dysfunction   . Generalized anxiety disorder   . Gout   . Hypertension   . Recurrent cold sores   . Seborrheic dermatitis     Surgical History: Past Surgical History:  Procedure Laterality Date  . IR NEPHROSTOMY PLACEMENT LEFT  04/09/2018  . IR NEPHROSTOMY PLACEMENT LEFT  04/11/2018  . IR NEPHROSTOMY PLACEMENT RIGHT  04/09/2018  . VASECTOMY  2014    Home Medications:  Allergies as of 04/23/2018      Reactions   Sulfa Antibiotics Hives      Medication List        Accurate as of 04/23/18  2:20 PM. Always use your most recent med list.          acetaminophen 325 MG tablet Commonly known as:  TYLENOL Take by mouth.   alfuzosin 10 MG 24 hr tablet Commonly known as:  UROXATRAL Take 1 tablet (10 mg total) by mouth daily with breakfast.   allopurinol 100 MG tablet Commonly known as:  ZYLOPRIM Take 1 tablet (100 mg total) by mouth 3 (three) times daily.   amLODipine 10 MG tablet Commonly known as:  NORVASC Take 1 tablet (10 mg total) by mouth daily.   butalbital-acetaminophen-caffeine 50-325-40 MG tablet Commonly known as:  FIORICET,  ESGIC Take by mouth.   dronabinol 5 MG capsule Commonly known as:  MARINOL Take by mouth.   DULoxetine 30 MG capsule Commonly known as:  CYMBALTA TAKE 3 CAPSULES BY MOUTH EVERY DAY   fluconazole 200 MG tablet Commonly known as:  DIFLUCAN Take by mouth.   LEVAQUIN 500 MG tablet Generic drug:  levofloxacin Take by mouth.   LORazepam 1 MG tablet Commonly known as:  ATIVAN Take 0.5-1 tablets (0.5-1 mg total) by mouth at bedtime as needed. for anxiety   NEULASTA 6 MG/0.6ML injection Generic drug:  pegfilgrastim Inject into the skin.   oxybutynin 5 MG tablet Commonly known as:  DITROPAN Take by mouth.   Oxycodone HCl 10 MG Tabs Take by mouth.   polyethylene glycol packet Commonly known as:  MIRALAX / GLYCOLAX Take by mouth.   Senna 8.6 MG Caps Take by mouth.   sevelamer carbonate 800 MG tablet Commonly known as:  RENVELA Take by mouth.   tamsulosin 0.4 MG Caps capsule Commonly known as:  FLOMAX Take by mouth.   thiamine 100 MG tablet Take by mouth.   VALTREX 500 MG tablet Generic drug:  valACYclovir Take by mouth.       Allergies:  Allergies  Allergen Reactions  . Sulfa Antibiotics Hives    Family History: History reviewed. No pertinent family history.  Social History:  reports that he has never  smoked. He has never used smokeless tobacco. He reports that he drinks alcohol. He reports that he does not use drugs.  ROS: UROLOGY Frequent Urination?: No Hard to postpone urination?: No Burning/pain with urination?: No Get up at night to urinate?: No Leakage of urine?: No Urine stream starts and stops?: No Trouble starting stream?: No Do you have to strain to urinate?: No Blood in urine?: No Urinary tract infection?: No Sexually transmitted disease?: No Injury to kidneys or bladder?: No Painful intercourse?: No Weak stream?: No Erection problems?: Yes Penile pain?: No  Gastrointestinal Nausea?: Yes Vomiting?: Yes Indigestion/heartburn?:  No Diarrhea?: No Constipation?: Yes  Constitutional Fever: No Night sweats?: No Weight loss?: No Fatigue?: No  Skin Skin rash/lesions?: No Itching?: No  Eyes Blurred vision?: No Double vision?: No  Ears/Nose/Throat Sore throat?: No Sinus problems?: No  Hematologic/Lymphatic Swollen glands?: No Easy bruising?: No  Cardiovascular Leg swelling?: No Chest pain?: No  Respiratory Cough?: No Shortness of breath?: No  Endocrine Excessive thirst?: No  Musculoskeletal Back pain?: Yes Joint pain?: No  Neurological Headaches?: No Dizziness?: No  Psychologic Depression?: Yes Anxiety?: Yes  Physical Exam: BP (!) 130/96 (BP Location: Left Arm, Patient Position: Sitting, Cuff Size: Large)   Pulse (!) 118   Ht 6\' 2"  (1.88 m)   Wt 248 lb 12.8 oz (112.9 kg)   BMI 31.94 kg/m   Constitutional:  Alert and oriented, No acute distress. HEENT: [place] AT, moist mucus membranes.  Trachea midline, no masses. Cardiovascular: No clubbing, cyanosis, or edema. Respiratory: Normal respiratory effort, no increased work of breathing. GI: Abdomen is soft, nontender, nondistended, no abdominal masses GU: No CVA tenderness Lymph: No cervical or inguinal lymphadenopathy. Skin: No rashes, bruises or suspicious lesions. Neurologic: Grossly intact, no focal deficits, moving all 4 extremities. Psychiatric: Normal mood and affect.  Laboratory Data: Lab Results  Component Value Date   WBC 2.1 (L) 04/12/2018   HGB 10.2 (L) 04/12/2018   HCT 28.4 (L) 04/12/2018   MCV 93.9 04/12/2018   PLT 162 04/12/2018    Lab Results  Component Value Date   CREATININE 1.22 04/12/2018    Lab Results  Component Value Date   HGBA1C 5.2 07/02/2017    Urinalysis Dipstick 3+ protein 3+ blood; microscopy 11-30 RBC   Assessment & Plan:   37 year old male with lymphoma recurrence involving the prostate.  He is currently undergoing chemotherapy at [facility]  He was informed that his nephrostomy tubes could  not be removed until it is documented that the obstruction is resolved.  Will schedule him for bilateral nephrostograms in interventional radiology.   Abbie Sons, [place] [address], [place], [place] [address] [phone]

## 2018-04-24 ENCOUNTER — Encounter: Payer: Self-pay | Admitting: Urology

## 2018-04-24 ENCOUNTER — Telehealth: Payer: Self-pay | Admitting: Internal Medicine

## 2018-04-24 NOTE — Telephone Encounter (Signed)
Noted  

## 2018-04-24 NOTE — Telephone Encounter (Signed)
Copied from [place] [id number]. Topic: Quick Communication - See Telephone Encounter >> Apr 24, 2018  1:26 PM Boyd Kerbs wrote: CRM for notification. See Telephone encounter for: 04/24/18.  Pt has refused home health services.  Becky Sax from [place]

## 2018-04-25 NOTE — Telephone Encounter (Signed)
Discussed appointment for nephrostograms & possible removal of nephrostomy tubes with patient. Patient requests an appointment sooner than was available in Interventional Radiology. He insists that although he will be admitted to [facility] for chemo from 7/18 until 05/05/2018 he is confidant that he will be able to be released from the hospital in order to have the tubes removed. He has consulted with his doctor at [facility] about removing the tubes & was told they couldn't do that because they weren't placed at [facility]. Per Juanita in Interventional Radiology, If patient leaves AMA, the procedure will not be performed on 05/02/2018, therefore she recommends keeping the appointment as scheduled on 05/07/2018 at 12:30.

## 2018-04-29 ENCOUNTER — Telehealth: Payer: Self-pay | Admitting: Radiology

## 2018-04-29 ENCOUNTER — Ambulatory Visit: Payer: Self-pay | Admitting: Psychology

## 2018-04-29 NOTE — Telephone Encounter (Signed)
Spoke with Dr Eveline Keto nurse from [facility] who states patient is scheduled to be admitted for chemotherapy on 05/02/2018 at 10:00am. Explained to patient that because of this it is not feasible to have nephrostograms with possible removal of nephrostomy tubes on the same day.  Advised patient to keep appointment currently scheduled at [facility] Interventional Radiology Department on 05/07/2018 with arrival of 12:30. Patient became agitated but voices understanding before hanging up abruptly.

## 2018-04-30 ENCOUNTER — Other Ambulatory Visit: Payer: Self-pay | Admitting: Internal Medicine

## 2018-04-30 MED ORDER — LORAZEPAM 1 MG PO TABS: 0.5000 mg | ORAL_TABLET | Freq: Every evening | ORAL | 0 refills | Status: DC | PRN

## 2018-04-30 NOTE — Telephone Encounter (Signed)
Received call from Tennis Ship, [place] Nurse Navigator, stating patient will actually be admitted to [facility] for chemotherapy late day on 05/02/2018 & requests that patient have appointment for possible nephrostomy tube removal on the morning of 05/02/2018 as patient is very upset & under stress due to diagnosis.  Appointment was moved to 05/02/2018 as requested. Sulin states she prefers to contact patient with this information. Instructions given. Sulin voices understanding.

## 2018-04-30 NOTE — Telephone Encounter (Signed)
Name of Medication: Lorazepam 1 mg Name of Pharmacy: [facility] or Written Date and Quantity: # 30 on 03/21/18 Last Office Visit and Type: 01/07/18 HFU and 10/25/17   3 mth narcotic f/u Next Office Visit and Type: none Last Controlled Substance Agreement Date: 01/14/17 Last UDS:01/14/17 and 01/07/18.  +

## 2018-05-02 ENCOUNTER — Encounter: Payer: Self-pay | Admitting: Interventional Radiology

## 2018-05-02 ENCOUNTER — Ambulatory Visit: Payer: BC Managed Care – PPO

## 2018-05-02 ENCOUNTER — Ambulatory Visit
Admission: RE | Admit: 2018-05-02 | Discharge: 2018-05-02 | Disposition: A | Discharge status: Home / Self Care | Payer: BC Managed Care – PPO | Source: Ambulatory Visit | Attending: Urology | Admitting: Urology

## 2018-05-02 ENCOUNTER — Telehealth: Payer: Self-pay

## 2018-05-02 DIAGNOSIS — N139 Obstructive and reflux uropathy, unspecified: Secondary | ICD-10-CM

## 2018-05-02 DIAGNOSIS — C8336 Diffuse large B-cell lymphoma, intrapelvic lymph nodes: Secondary | ICD-10-CM | POA: Diagnosis not present

## 2018-05-02 DIAGNOSIS — Z436 Encounter for attention to other artificial openings of urinary tract: Secondary | ICD-10-CM | POA: Insufficient documentation

## 2018-05-02 HISTORY — PX: IR NEPHROSTOGRAM RIGHT THRU EXISTING ACCESS: IMG6062

## 2018-05-02 HISTORY — PX: IR NEPHROSTOGRAM LEFT THRU EXISTING ACCESS: IMG6061

## 2018-05-02 MED ORDER — IOPAMIDOL (ISOVUE-300) INJECTION 61%
30.0000 mL | Freq: Once | INTRAVENOUS | Status: AC | PRN
  Administered 2018-05-02: 10 mL via INTRAVENOUS

## 2018-05-02 NOTE — Telephone Encounter (Signed)
-----   Message from Abbie Sons, MD sent at 05/02/2018 12:52 PM EDT ----- Recommend a follow-up nurse visit with bladder scan approximately 2 weeks.

## 2018-05-02 NOTE — Telephone Encounter (Signed)
Left detailed message, please call and schedule nurse visit for bladder scan.

## 2018-05-02 NOTE — Discharge Instructions (Signed)
How to Change Your Dressing A dressing is a material that is placed in and over wounds. A dressing helps your wound to heal by protecting it from:  Bacteria.  Worse injury.  Being too dry or too wet.  What are the risks? The sticky (adhesive) tape that is used with a dressing may make your skin sore or irritated, or it may cause a rash. These are the most common problems. However, more serious problems can develop, such as:  Bleeding.  Infection.  How to change your dressing Getting Ready to Change Your Dressing   Take a shower before you do the first dressing change of the day. If your doctor does not want your wound to get wet and your dressing is not waterproof, you may need to put plastic leak-proof sealing wrap on your dressing to protect it.  If needed, take pain medicine as told by your doctor 30 minutes before you change your dressing.  Set up a clean station for wound care. You will need: ? A plastic trash bag that is open and ready to use. ? Hand sanitizer. ? Wound cleanser or salt-water solution (saline) as told by your doctor. ? New dressing material or bandages. Make sure to open the dressing package so the dressing stays on the inside of the package. You may also need these supplies in your clean station:  A box of vinyl gloves.  Tape.  Skin protectant. This may be a wipe, film, or spray.  Clean or germ-free (sterile) scissors.  A cotton-tipped applicator.  Taking Off Your Old Dressing  Wash your hands with soap and water. Dry your hands with a clean towel. If you cannot use soap and water, use hand sanitizer.  If you are using gloves, put on the gloves before you take off the dressing.  Gently take off any adhesive or tape by pulling it off in the direction of your hair growth. Only touch the outside edges of the dressing.  Take off the dressing. If the dressing sticks to your skin, wet the dressing with a germ-free salt-water solution. This helps it  come off more easily.  Take off any gauze or packing in your wound.  Throw the old dressing supplies into the ready trash bag.  Take off your gloves. To take off each glove, grab the cuff with your other hand and turn the glove inside out. Put the gloves in the trash right away.  Wash your hands with soap and water. Dry your hands with a clean towel. If you cannot use soap and water, use hand sanitizer. Cleaning Your Wound  Follow instructions from your doctor about how to clean your wound. This may include using a salt-water solution or recommended wound cleanser.  Do not use over-the-counter medicated or antiseptic creams, sprays, liquids, or dressings unless your doctor tells you to do that.  Use a clean gauze pad to clean the area fully with the salt-water solution or wound cleanser that your doctor recommends.  Throw the gauze pad into the trash bag.  Wash your hands with soap and water. Dry your hands with a clean towel. If you cannot use soap and water, use hand sanitizer. Putting on the Dressing  If your doctor recommended a skin protectant, put it on the skin around the wound.  Cover the wound with the recommended dressing, such as a nonstick gauze or bandage. Make sure to touch only the outside edges of the dressing. Do not touch the inside of the dressing.    Attach the dressing so all sides stay in place. You may do this with the attached medical adhesive, roll gauze, or tape. If you use tape, do not wrap the tape all the way around your arm or leg.  Take off your gloves. Put them in the trash bag with the old dressing. Tie the bag shut and throw it away.  Wash your hands with soap and water. Dry your hands with a clean towel. If you cannot use soap and water, use hand sanitizer. Get help if:   You have new pain.  You have irritation, a rash, or itching around the wound or dressing.  Changing your dressing is painful.  Changing your dressing causes a lot of  bleeding. Get help right away if:  You have very bad pain.  You have signs of infection, such as: ? More redness, swelling, or pain. ? More fluid or blood. ? Warmth. ? Pus or a bad smell. ? Red streaks leading from wound. ? A fever. This information is not intended to replace advice given to you by your health care provider. Make sure you discuss any questions you have with your health care provider. Document Released: 12/28/2008 Document Revised: 03/08/2016 Document Reviewed: 07/07/2015 Elsevier Interactive Patient Education  2018 [employer] Inc[employer]  

## 2018-05-03 ENCOUNTER — Other Ambulatory Visit: Payer: Self-pay | Admitting: Internal Medicine

## 2018-05-05 ENCOUNTER — Telehealth: Payer: Self-pay | Admitting: Urology

## 2018-05-05 NOTE — Telephone Encounter (Signed)
Lm to cb to schd bladder scan  [facility]

## 2018-05-07 ENCOUNTER — Ambulatory Visit: Payer: BC Managed Care – PPO

## 2018-05-25 MED ORDER — CALCIUM CARBONATE 600 MG PO TABS
ORAL_TABLET | ORAL | Status: DC
Start: 2018-05-25 — End: 2018-05-25

## 2018-05-25 MED ORDER — DRONABINOL 5 MG PO CAPS
10.00 | ORAL_CAPSULE | ORAL | Status: DC
Start: ? — End: 2018-05-25

## 2018-05-25 MED ORDER — SODIUM CHLORIDE 0.9 % IV SOLN
1000.00 | INTRAVENOUS | Status: DC
Start: ? — End: 2018-05-25

## 2018-05-25 MED ORDER — GENERIC EXTERNAL MEDICATION
2.00 | Status: DC
Start: 2018-05-25 — End: 2018-05-25

## 2018-05-25 MED ORDER — BUTALBITAL-APAP-CAFFEINE 50-325-40 MG PO TABS
1.00 | ORAL_TABLET | ORAL | Status: DC
Start: ? — End: 2018-05-25

## 2018-05-25 MED ORDER — MAGNESIUM OXIDE 400 MG PO TABS
800.00 | ORAL_TABLET | ORAL | Status: DC
Start: 2018-05-25 — End: 2018-05-25

## 2018-05-25 MED ORDER — DOCUSATE SODIUM 100 MG PO CAPS
100.00 | ORAL_CAPSULE | ORAL | Status: DC
Start: 2018-05-26 — End: 2018-05-25

## 2018-05-25 MED ORDER — CVS DRY SKIN CARE EX LOTN
1.00 | TOPICAL_LOTION | CUTANEOUS | Status: DC
Start: ? — End: 2018-05-25

## 2018-05-25 MED ORDER — SODIUM CHLORIDE 0.9 % IV SOLN
50.00 | INTRAVENOUS | Status: DC
Start: ? — End: 2018-05-25

## 2018-05-25 MED ORDER — VALACYCLOVIR HCL 500 MG PO TABS
500.00 | ORAL_TABLET | ORAL | Status: DC
Start: 2018-05-26 — End: 2018-05-25

## 2018-05-25 MED ORDER — DULOXETINE HCL 60 MG PO CPEP
60.00 | ORAL_CAPSULE | ORAL | Status: DC
Start: 2018-05-26 — End: 2018-05-25

## 2018-05-25 MED ORDER — SODIUM CHLORIDE FLUSH 0.9 % IV SOLN
10.00 | INTRAVENOUS | Status: DC
Start: 2018-05-25 — End: 2018-05-25

## 2018-05-25 MED ORDER — OXYBUTYNIN CHLORIDE 5 MG PO TABS
5.00 | ORAL_TABLET | ORAL | Status: DC
Start: 2018-05-25 — End: 2018-05-25

## 2018-05-25 MED ORDER — ACETAMINOPHEN 325 MG PO TABS
650.00 | ORAL_TABLET | ORAL | Status: DC
Start: ? — End: 2018-05-25

## 2018-05-25 MED ORDER — SODIUM CHLORIDE 0.9 % IV SOLN
20.00 | INTRAVENOUS | Status: DC
Start: ? — End: 2018-05-25

## 2018-05-25 MED ORDER — LOPERAMIDE HCL 2 MG PO CAPS
2.00 | ORAL_CAPSULE | ORAL | Status: DC
Start: 2018-05-25 — End: 2018-05-25

## 2018-05-25 MED ORDER — POLYETHYLENE GLYCOL 3350 17 G PO PACK
17.00 | PACK | ORAL | Status: DC
Start: 2018-05-26 — End: 2018-05-25

## 2018-05-25 MED ORDER — MORPHINE SULFATE ER 30 MG PO TBCR
60.00 | EXTENDED_RELEASE_TABLET | ORAL | Status: DC
Start: 2018-05-25 — End: 2018-05-25

## 2018-05-25 MED ORDER — GENERIC EXTERNAL MEDICATION
Status: DC
Start: ? — End: 2018-05-25

## 2018-05-25 MED ORDER — GENERIC EXTERNAL MEDICATION
5.00 | Status: DC
Start: ? — End: 2018-05-25

## 2018-05-25 MED ORDER — LORAZEPAM 1 MG PO TABS
1.00 | ORAL_TABLET | ORAL | Status: DC
Start: ? — End: 2018-05-25

## 2018-05-25 MED ORDER — AMLODIPINE BESYLATE 5 MG PO TABS
5.00 | ORAL_TABLET | ORAL | Status: DC
Start: 2018-05-26 — End: 2018-05-25

## 2018-05-26 ENCOUNTER — Other Ambulatory Visit: Payer: Self-pay

## 2018-05-26 MED ORDER — LORAZEPAM 1 MG PO TABS: 0.5000 mg | ORAL_TABLET | Freq: Every evening | ORAL | 0 refills | Status: AC | PRN

## 2018-05-26 NOTE — Telephone Encounter (Signed)
Last filled 04-30-18 #30 Last OV Acute 04-04-18 No Future OV

## 2018-07-07 MED ORDER — GENERIC EXTERNAL MEDICATION
2.00 | Status: DC
Start: ? — End: 2018-07-07

## 2018-07-07 MED ORDER — ONDANSETRON HCL 4 MG/2ML IJ SOLN
4.00 | INTRAMUSCULAR | Status: DC
Start: ? — End: 2018-07-07

## 2018-07-07 MED ORDER — ACD-A NOCLOT-50 0.73-2.45-2.2 GM/100ML VI SOLN
750.00 | Status: DC
Start: ? — End: 2018-07-07

## 2018-07-07 MED ORDER — ATROPINE SULFATE 1 % OP SOLN
1.00 | OPHTHALMIC | Status: DC
Start: ? — End: 2018-07-07

## 2018-07-07 MED ORDER — GENERIC EXTERNAL MEDICATION
Status: DC
Start: ? — End: 2018-07-07

## 2018-07-07 MED ORDER — SODIUM CHLORIDE FLUSH 0.9 % IV SOLN
10.00 | INTRAVENOUS | Status: DC
Start: 2018-07-07 — End: 2018-07-07

## 2018-07-07 MED ORDER — CARBOXYMETHYLCELLULOSE SODIUM 0.25 % OP SOLN
1.00 | OPHTHALMIC | Status: DC
Start: ? — End: 2018-07-07

## 2018-07-07 MED ORDER — SCOPOLAMINE 1 MG/3DAYS TD PT72
1.00 | MEDICATED_PATCH | TRANSDERMAL | Status: DC
Start: 2018-07-09 — End: 2018-07-07

## 2018-07-07 MED ORDER — LOPERAMIDE HCL 2 MG PO CAPS
2.00 | ORAL_CAPSULE | ORAL | Status: DC
Start: ? — End: 2018-07-07

## 2018-07-07 MED ORDER — ONDANSETRON HCL 4 MG/2ML IJ SOLN
8.00 | INTRAMUSCULAR | Status: DC
Start: ? — End: 2018-07-07

## 2018-07-07 MED ORDER — LORAZEPAM 2 MG/ML IJ SOLN
1.00 | INTRAMUSCULAR | Status: DC
Start: ? — End: 2018-07-07

## 2018-07-15 DEATH — deceased

## 2018-08-26 IMAGING — US US RENAL
1 series · 14 of 25 positions shown · non-contrast
Comparison: CT Abdomen and Pelvis 12/27/2017.

CLINICAL DATA: 36-year-old male with acute renal insufficiency.
History of B-cell lymphoma.

EXAM:
RENAL / URINARY TRACT ULTRASOUND COMPLETE

[Series 1: us renal · 0.28mm/px · 14 of 48 slices shown]
[im 1/48]
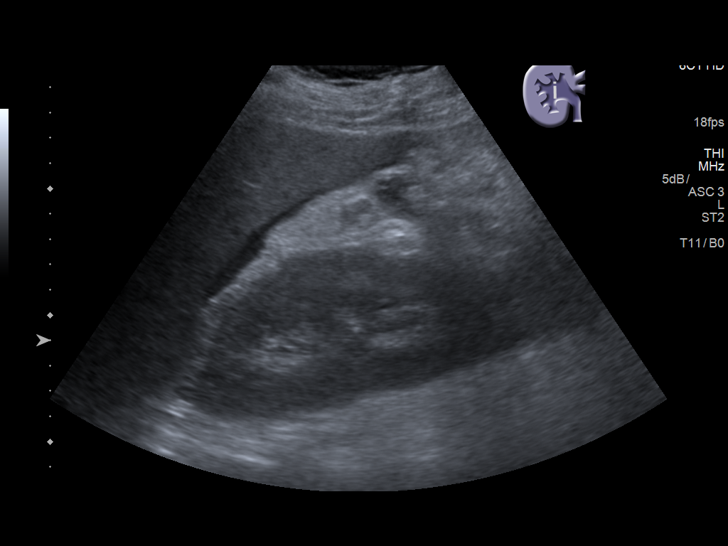
[im 4/48]
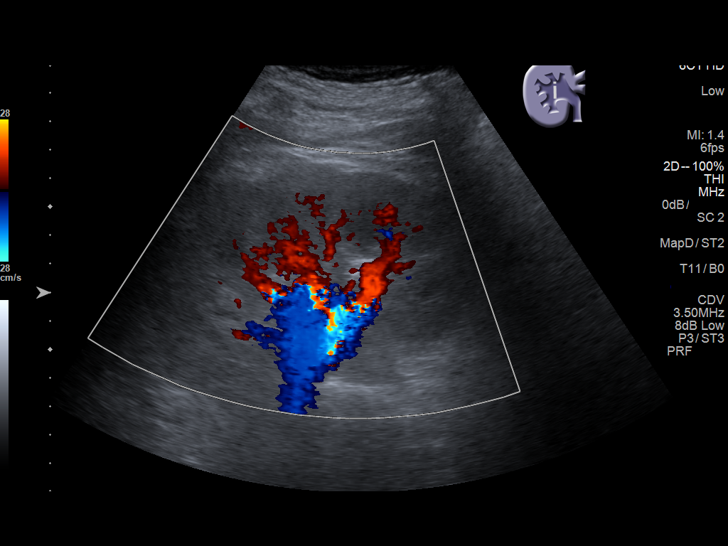
[im 8/48]
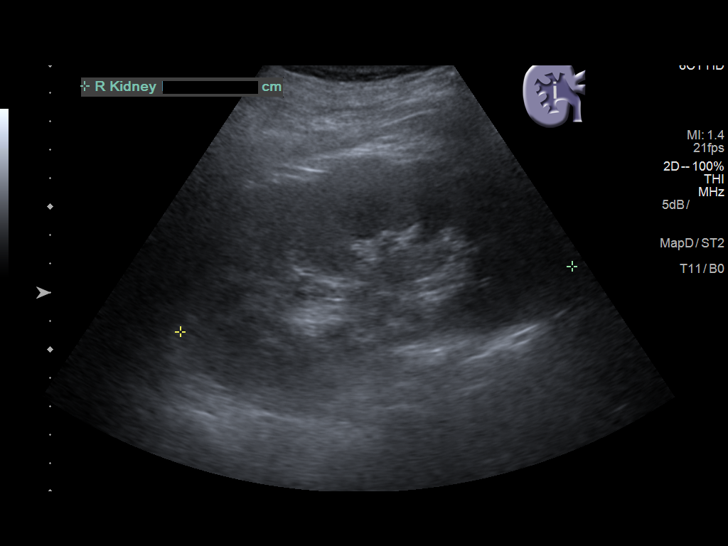
[im 12/48]
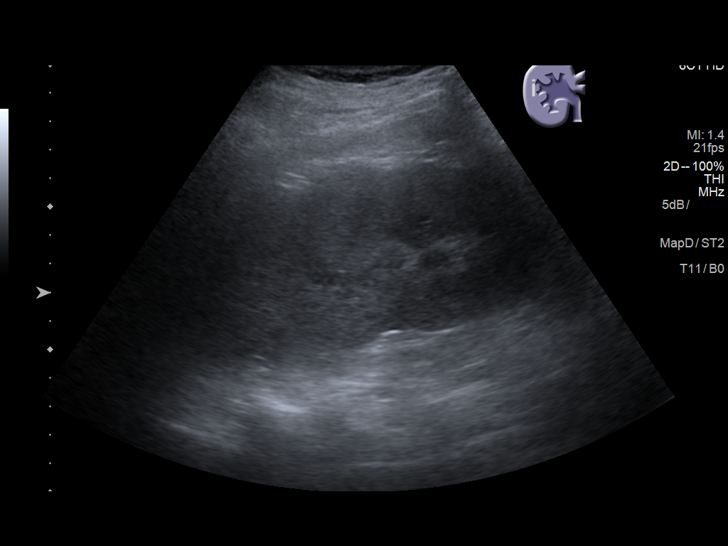
[im 16/48]
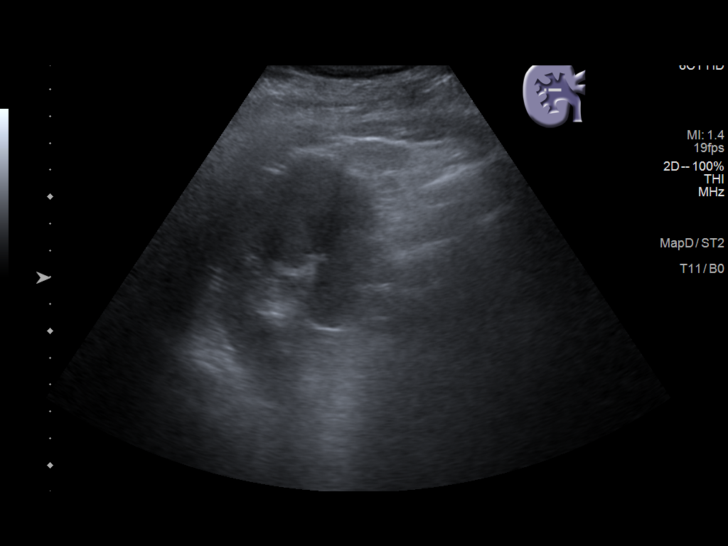
[im 18/48]
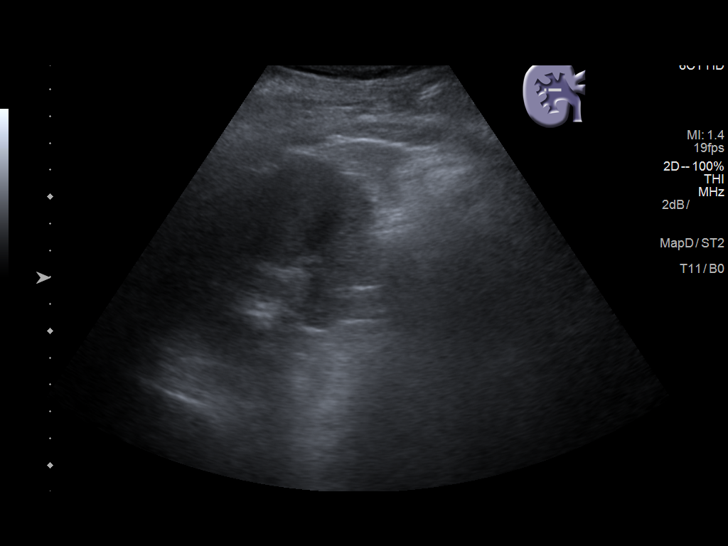
[im 22/48]
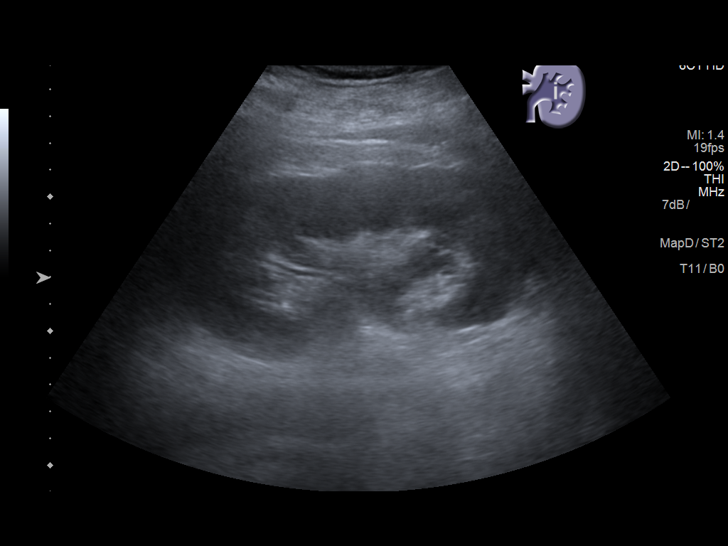
[im 26/48]
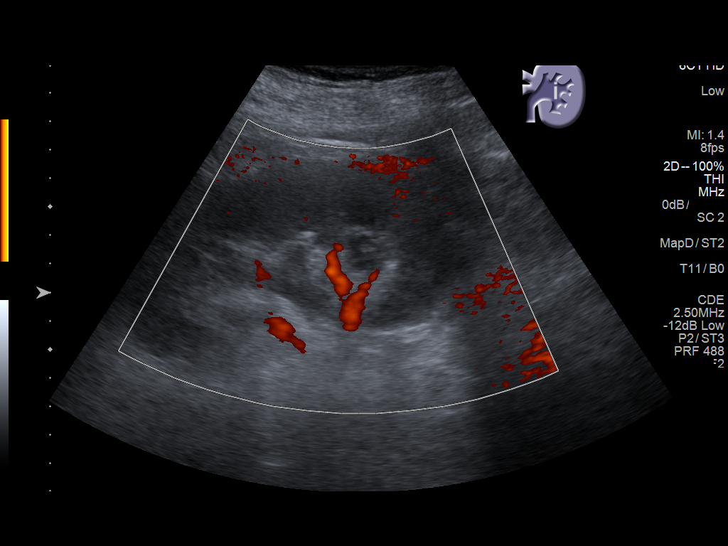
[im 30/48]
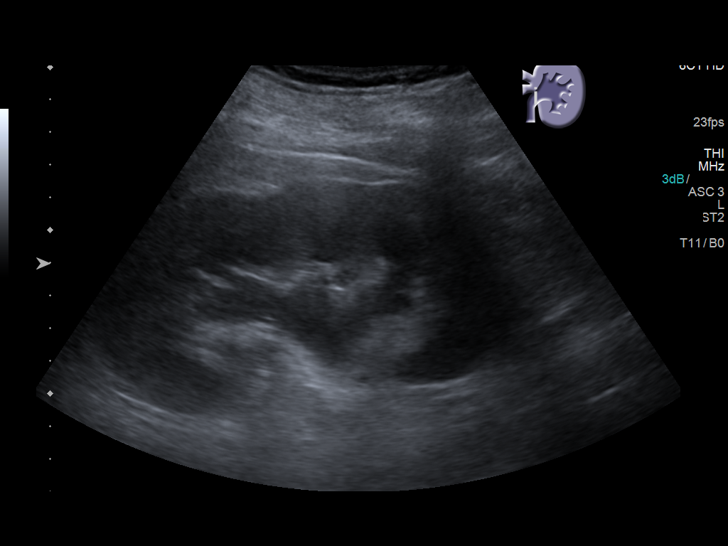
[im 32/48]
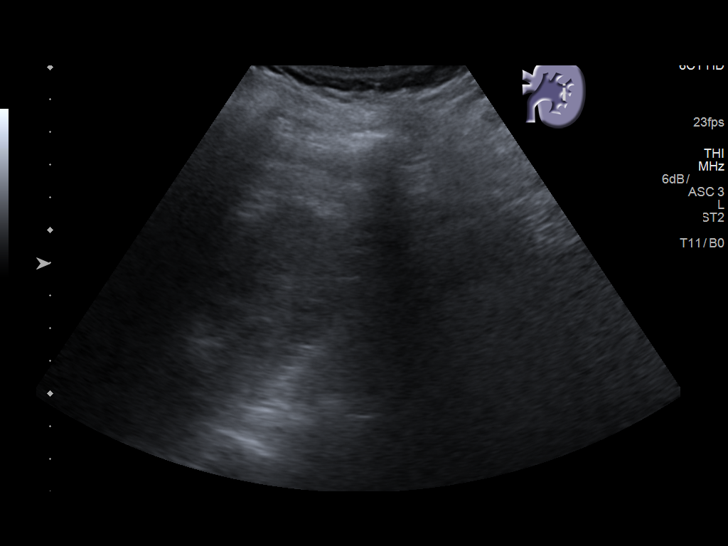
[im 36/48]
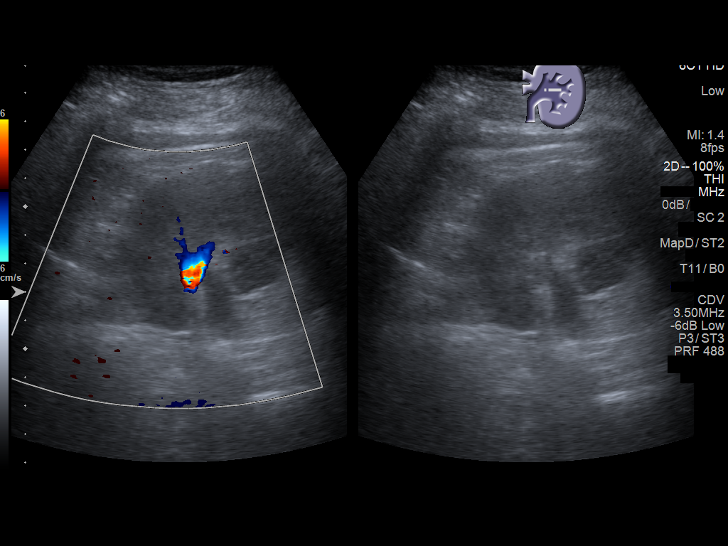
[im 40/48]
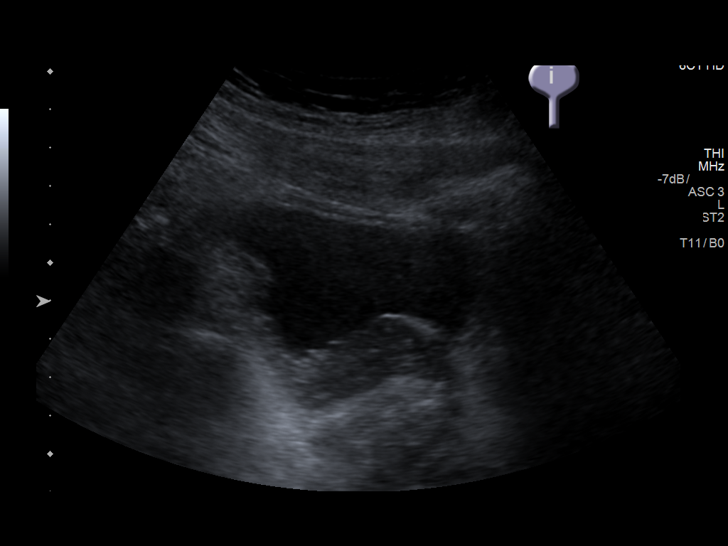
[im 44/48]
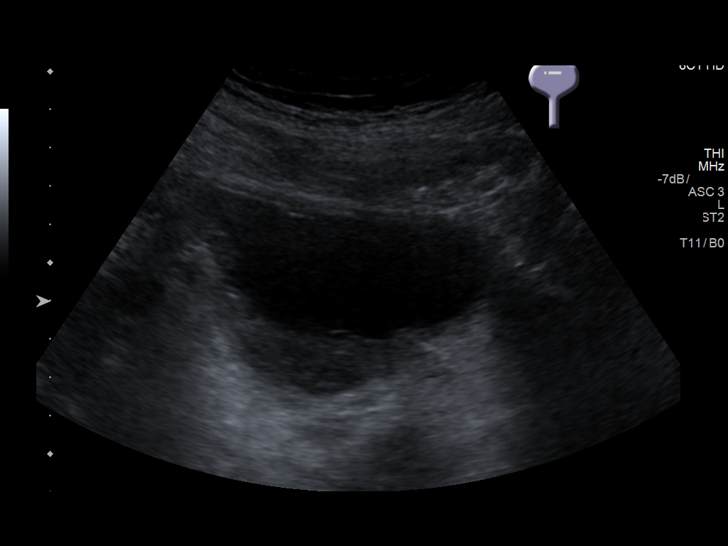
[im 48/48]
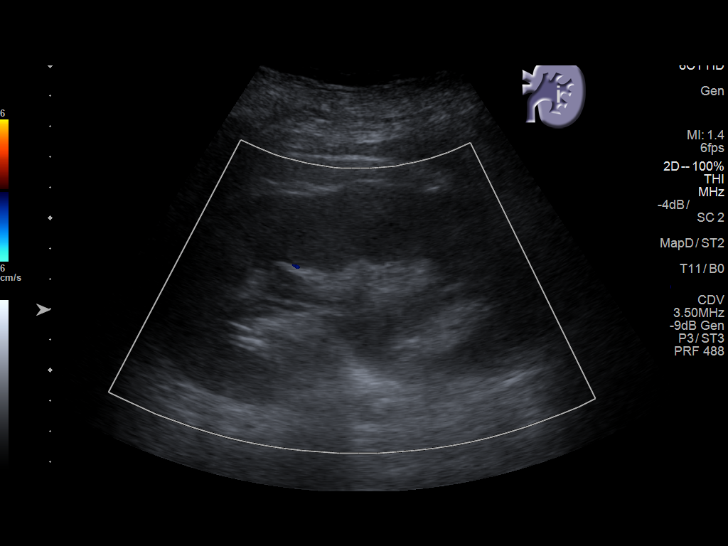

[14 of 25 positions shown; findings below may reference images not displayed]

FINDINGS: Right Kidney:

Length: 13.9 centimeters. Echogenicity within normal limits. No mass
or hydronephrosis visualized.

Left Kidney:

Length: 13.5 centimeters. There is mild hydronephrosis (image 31)
which is new. Otherwise normal ultrasound appearance of the left
kidney. Postvoid images of the left kidney appear unchanged (image
56).

Bladder:

Small bladder volume. Nonspecific mild to moderate bladder wall
thickening (image 51). No urinary debris.

Other findings: Trace free fluid about the liver, Morrison's pouch
(image 6).
IMPRESSION: 1. Mild left hydronephrosis is new since 12/27/2017, etiology
unclear. There is mild to moderate nonspecific bladder wall
thickening.
2. The right kidney appears normal.
3. Small volume of nonspecific perihepatic free fluid/ascites.

## 2020-06-16 IMAGING — CT CT RENAL STONE PROTOCOL
2 of 4 series · 15 of 46 positions shown, 17 images · non-contrast
Comparison: Ultrasound 04/04/2018, CT 12/27/2017

CLINICAL DATA: Abdominal pain pressure with urination, decreased
urination

EXAM:
CT ABDOMEN AND PELVIS WITHOUT CONTRAST
TECHNIQUE: Multidetector CT imaging of the abdomen and pelvis was performed
following the standard protocol without IV contrast.

[Series 2: stone full standard · axial · 0.93mm/px · z∈[-1144,-604]mm · 12 of 118 slices shown, 14 images]
[im 5/118  soft-tissue]
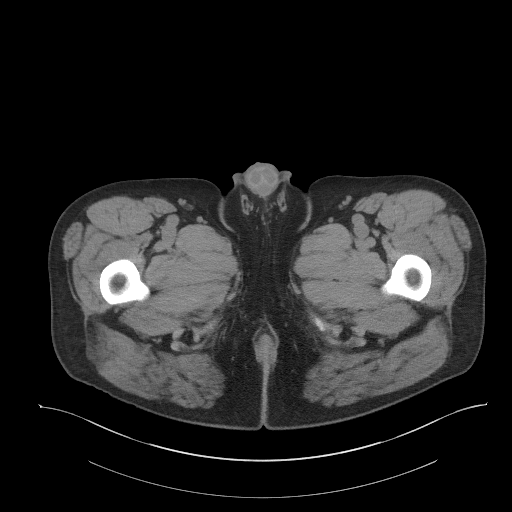
[im 5/118  bone]
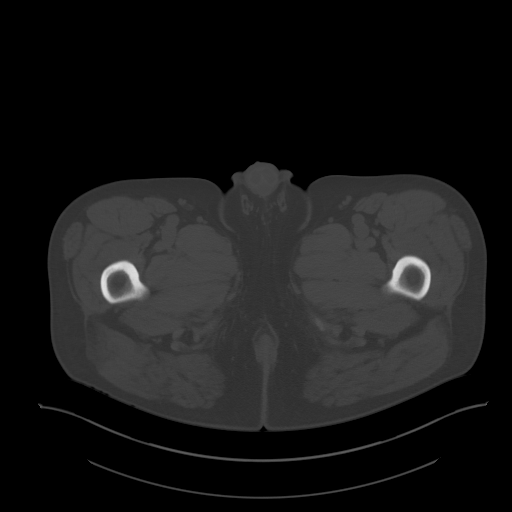
[im 15/118  soft-tissue]
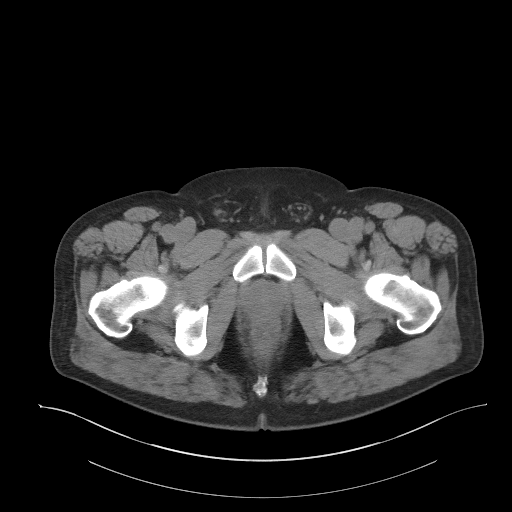
[im 25/118  soft-tissue]
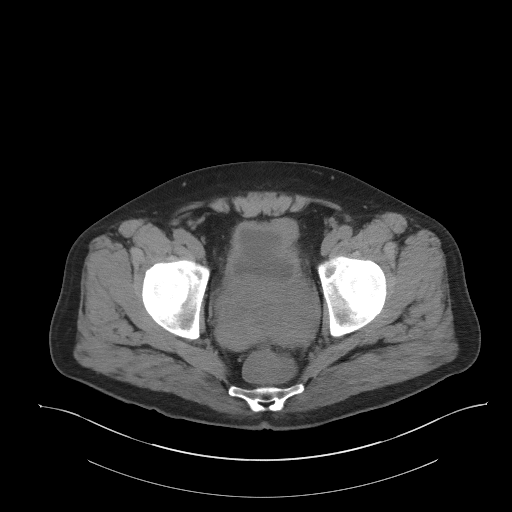
[im 35/118  soft-tissue]
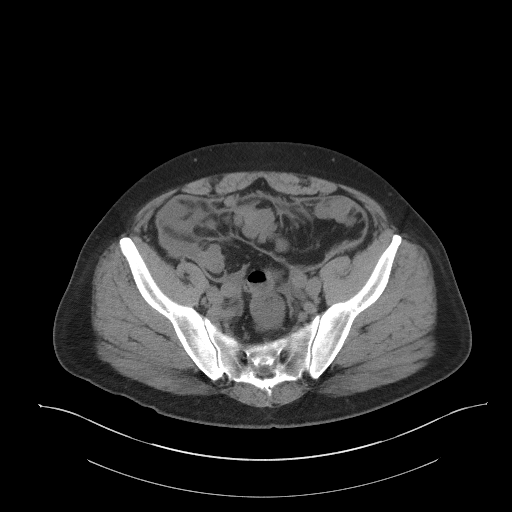
[im 44/118  soft-tissue]
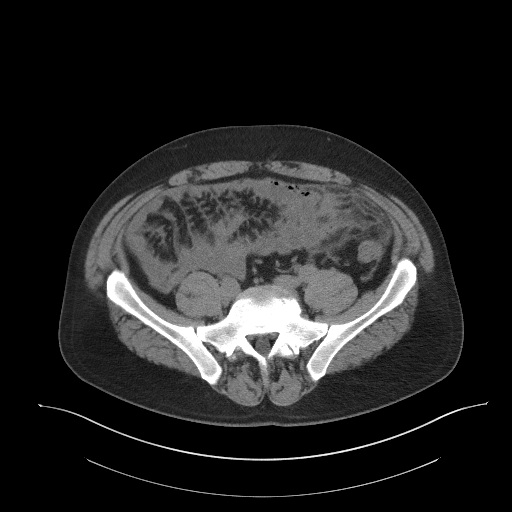
[im 54/118  soft-tissue]
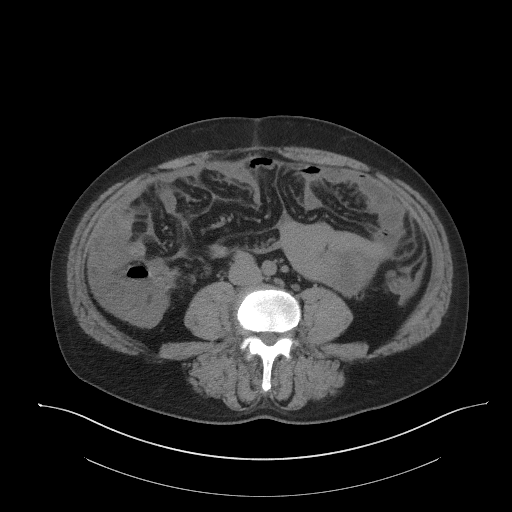
[im 64/118  soft-tissue]
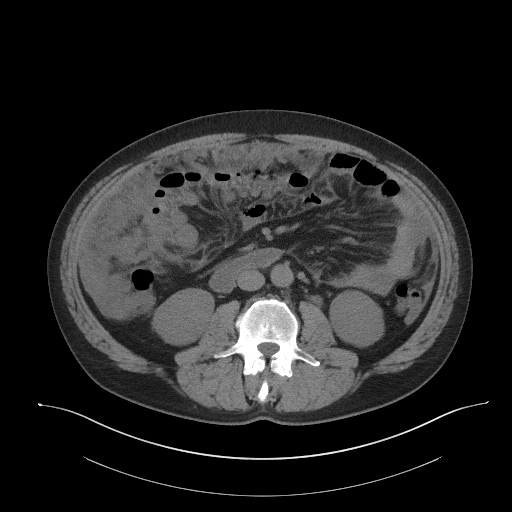
[im 74/118  soft-tissue]
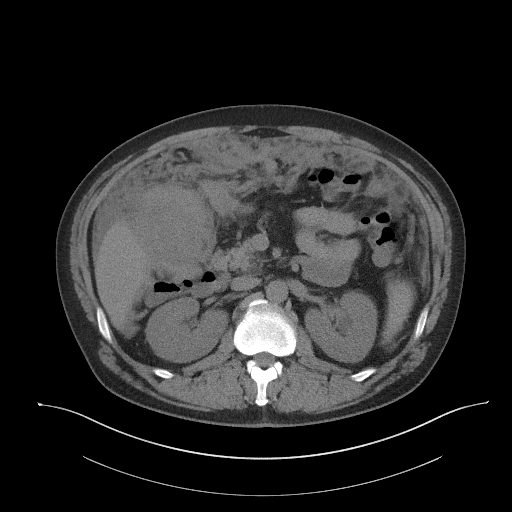
[im 83/118  soft-tissue]
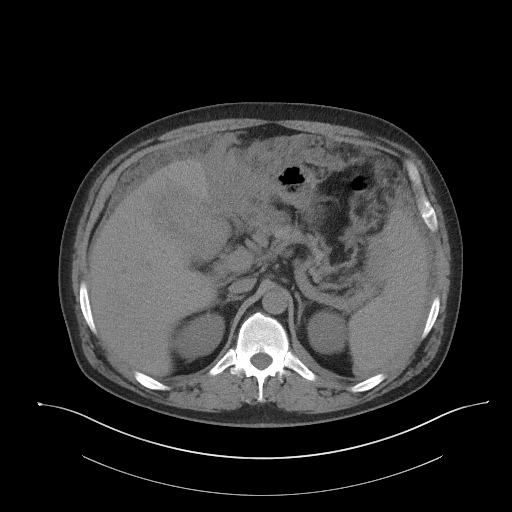
[im 83/118  bone]
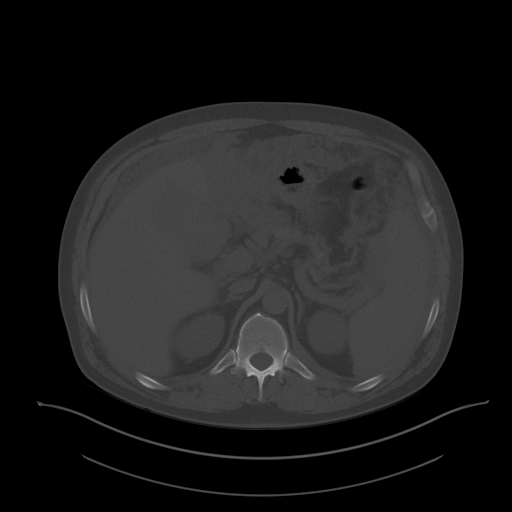
[im 93/118  soft-tissue]
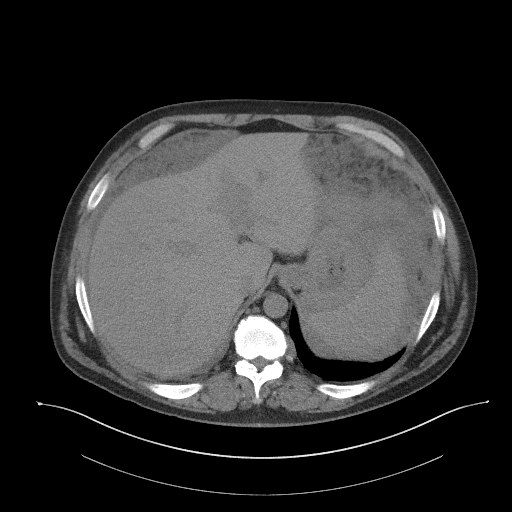
[im 103/118  soft-tissue]
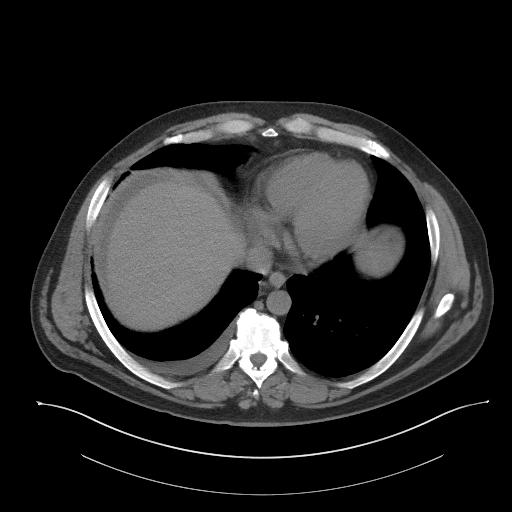
[im 113/118  soft-tissue]
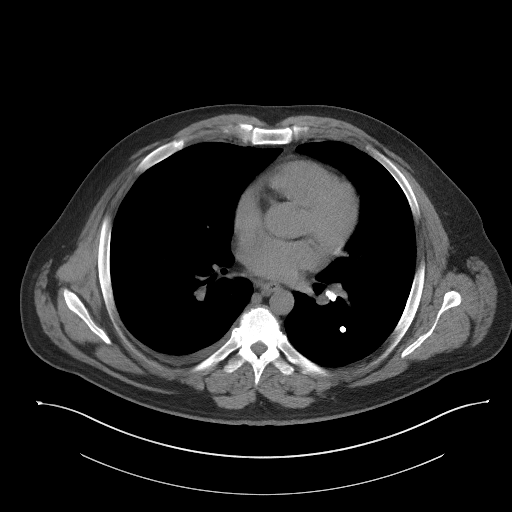

[Series 5: coronal · coronal · 0.89mm/px · 3 of 148 slices shown]
[im 50/148  soft-tissue]
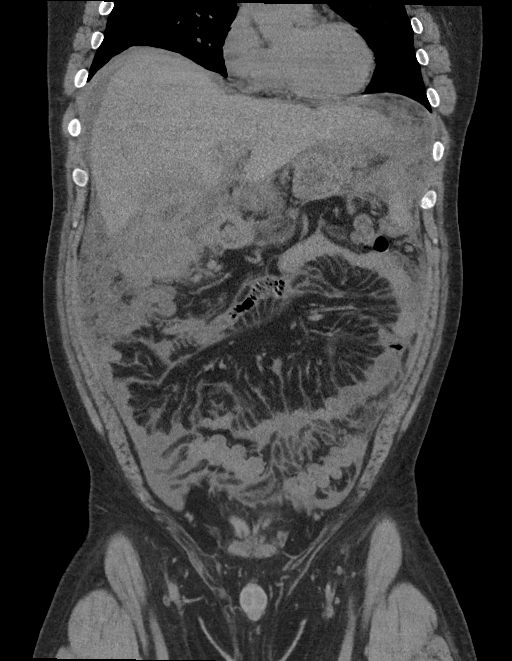
[im 66/148  soft-tissue]
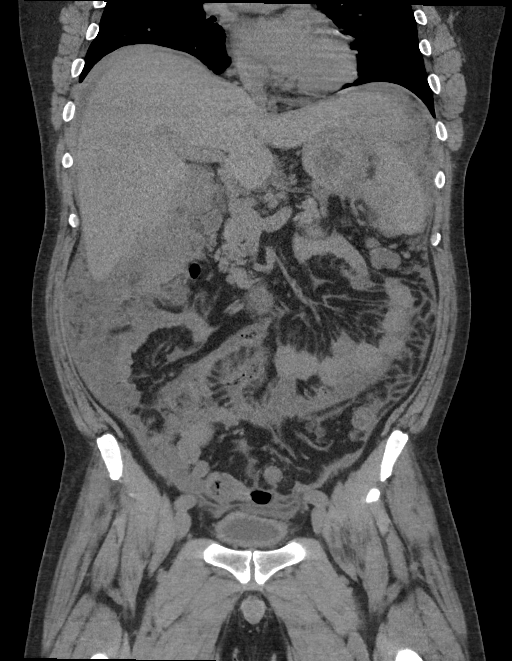
[im 82/148  soft-tissue]
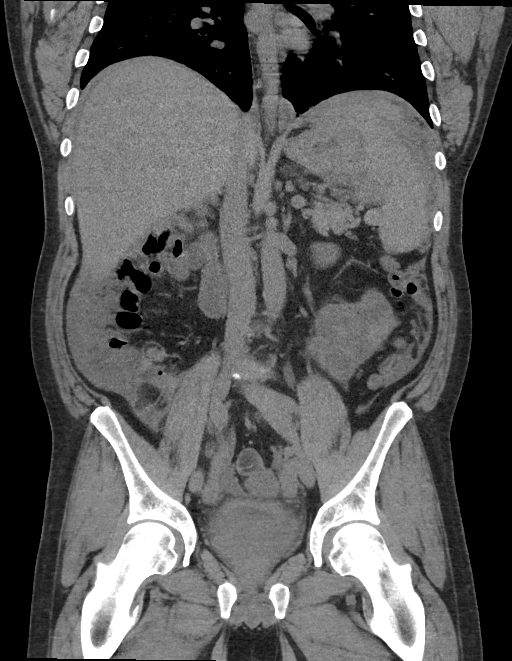

[15 of 46 positions shown; findings below may reference images not displayed]

FINDINGS: Lower chest: Lung bases demonstrate calcified left lower lobe
granuloma. Calcified left infrahilar lymph nodes. Heart size within
normal limits. Trace right pleural effusion.

Hepatobiliary: No focal hepatic abnormality. Negative for biliary
enlargement. Significant wall thickening of the gallbladder. No
calcified stones. Masslike area measuring 5.5 x 4.1 cm at the
falciform ligament.

Pancreas: Unremarkable. No pancreatic ductal dilatation or
surrounding inflammatory changes.

Spleen: No focal abnormality.  Slightly enlarged at 15 cm.

Adrenals/Urinary Tract: Adrenal glands are within normal limits.
Mild left hydronephrosis. No ureteral stone. Diffuse bladder wall
thickening with nodularity along the anterior wall on the left side.

Stomach/Bowel: The stomach is nonenlarged. No dilated small bowel.
No colon wall thickening.

Vascular/Lymphatic: Mild aortic atherosclerosis. No aneurysmal
dilatation. Slight increased size portal caval node measuring 22 mm
compared with 15 mm previously.

Reproductive: Interval marked enlargement of the prostate gland, now
measuring 9.3 cm transverse by 6.3 cm AP.

Other: No free air. Small amount of ascites within the abdomen and
pelvis. Fluid infiltration along the mesentery. Diffuse nodular
thickening of the anterior peritoneal cavity. Small amount of
ascites around the liver and spleen. Suspected mass in the right
upper quadrant of the abdomen measuring 7.5 by 5.8 cm.

Musculoskeletal: No acute or suspicious abnormality.
IMPRESSION: 1. Mild left hydronephrosis and hydroureter, but no ureteral stone
visible.
2. Interim development of diffuse infiltration and thickening of the
peritoneal cavity, appearance of which suggests carcinomatosis or
lymphomatosis, given history of prior lymphoma. Development of
masslike region along the falciform ligament, in addition to a
cm right upper quadrant intraperitoneal mass, also suspicious for
metastatic disease/possible lymphoma recurrence.
3. Diffuse gallbladder wall thickening, can also be seen in the
setting of lymphoma. Cholecystitis may also be considered.
4. Interval massive enlargement of the prostate, can also be seen in
the setting of lymphoma.
5. Diffuse bladder wall thickening with masslike appearance along
the left anterior wall.
6. Small right pleural effusion.
